# Patient Record
Sex: Male | Born: 1955 | Race: Black or African American | Hispanic: No | Smoking: Current every day smoker
Health system: Southern US, Community
[De-identification: ages and names within clinical notes are randomized; demographics above are authoritative.]

## PROBLEM LIST (undated history)

## (undated) DIAGNOSIS — D329 Benign neoplasm of meninges, unspecified: Secondary | ICD-10-CM

## (undated) DIAGNOSIS — G459 Transient cerebral ischemic attack, unspecified: Secondary | ICD-10-CM

## (undated) DIAGNOSIS — I10 Essential (primary) hypertension: Secondary | ICD-10-CM

## (undated) DIAGNOSIS — G40909 Epilepsy, unspecified, not intractable, without status epilepticus: Secondary | ICD-10-CM

## (undated) DIAGNOSIS — B1920 Unspecified viral hepatitis C without hepatic coma: Secondary | ICD-10-CM

## (undated) DIAGNOSIS — I5031 Acute diastolic (congestive) heart failure: Secondary | ICD-10-CM

## (undated) DIAGNOSIS — N183 Chronic kidney disease, stage 3 unspecified: Secondary | ICD-10-CM

## (undated) DIAGNOSIS — F1010 Alcohol abuse, uncomplicated: Secondary | ICD-10-CM

## (undated) DIAGNOSIS — I701 Atherosclerosis of renal artery: Secondary | ICD-10-CM

## (undated) HISTORY — DX: Acute diastolic (congestive) heart failure: I50.31

## (undated) HISTORY — DX: Transient cerebral ischemic attack, unspecified: G45.9

## (undated) HISTORY — DX: Essential (primary) hypertension: I10

## (undated) HISTORY — DX: Benign neoplasm of meninges, unspecified: D32.9

## (undated) HISTORY — DX: Alcohol abuse, uncomplicated: F10.10

## (undated) HISTORY — DX: Epilepsy, unspecified, not intractable, without status epilepticus: G40.909

## (undated) HISTORY — DX: Chronic kidney disease, stage 3 unspecified: N18.30

## (undated) HISTORY — DX: Unspecified viral hepatitis C without hepatic coma: B19.20

## (undated) HISTORY — DX: Chronic kidney disease, stage 3 (moderate): N18.3

## (undated) HISTORY — DX: Atherosclerosis of renal artery: I70.1

## (undated) HISTORY — PX: BRAIN SURGERY: SHX531

---

## 2002-12-08 ENCOUNTER — Other Ambulatory Visit: Payer: Self-pay

## 2002-12-09 ENCOUNTER — Other Ambulatory Visit: Payer: Self-pay

## 2004-12-25 ENCOUNTER — Emergency Department: Payer: Self-pay | Admitting: Emergency Medicine

## 2005-09-06 ENCOUNTER — Emergency Department: Payer: Self-pay | Admitting: Emergency Medicine

## 2006-05-01 ENCOUNTER — Emergency Department: Payer: Self-pay | Admitting: Emergency Medicine

## 2006-08-14 ENCOUNTER — Emergency Department: Payer: Self-pay | Admitting: Emergency Medicine

## 2006-08-14 ENCOUNTER — Ambulatory Visit: Payer: Self-pay | Admitting: Pulmonary Disease

## 2006-08-14 ENCOUNTER — Inpatient Hospital Stay (HOSPITAL_COMMUNITY): Admission: EM | Admit: 2006-08-14 | Discharge: 2006-09-06 | Payer: Self-pay | Admitting: Neurosurgery

## 2006-08-14 ENCOUNTER — Ambulatory Visit: Payer: Self-pay | Admitting: Cardiology

## 2006-08-17 ENCOUNTER — Encounter (INDEPENDENT_AMBULATORY_CARE_PROVIDER_SITE_OTHER): Payer: Self-pay | Admitting: Neurosurgery

## 2006-08-28 ENCOUNTER — Encounter (INDEPENDENT_AMBULATORY_CARE_PROVIDER_SITE_OTHER): Payer: Self-pay | Admitting: Neurosurgery

## 2006-09-06 ENCOUNTER — Encounter (INDEPENDENT_AMBULATORY_CARE_PROVIDER_SITE_OTHER): Payer: Self-pay | Admitting: Internal Medicine

## 2006-09-09 ENCOUNTER — Ambulatory Visit: Payer: Self-pay | Admitting: Cardiovascular Disease

## 2006-09-09 LAB — CONVERTED CEMR LAB
Calcium: 8.7 mg/dL (ref 8.4–10.5)
GFR calc Af Amer: 55 mL/min
Glucose, Bld: 86 mg/dL (ref 70–99)
Potassium: 4.9 meq/L (ref 3.5–5.1)

## 2006-10-03 ENCOUNTER — Ambulatory Visit: Payer: Self-pay | Admitting: Cardiovascular Disease

## 2006-10-09 ENCOUNTER — Ambulatory Visit: Payer: Self-pay | Admitting: Cardiovascular Disease

## 2006-10-09 LAB — CONVERTED CEMR LAB
BUN: 15 mg/dL (ref 6–23)
CO2: 31 meq/L (ref 19–32)
Calcium: 9.1 mg/dL (ref 8.4–10.5)
Chloride: 107 meq/L (ref 96–112)
Creatinine, Ser: 1.7 mg/dL — ABNORMAL HIGH (ref 0.4–1.5)
GFR calc Af Amer: 55 mL/min
Glucose, Bld: 127 mg/dL — ABNORMAL HIGH (ref 70–99)
Potassium: 3.4 meq/L — ABNORMAL LOW (ref 3.5–5.1)

## 2006-10-21 ENCOUNTER — Encounter (INDEPENDENT_AMBULATORY_CARE_PROVIDER_SITE_OTHER): Payer: Self-pay | Admitting: Internal Medicine

## 2006-10-21 ENCOUNTER — Ambulatory Visit: Payer: Self-pay | Admitting: Family Medicine

## 2006-10-21 DIAGNOSIS — R569 Unspecified convulsions: Secondary | ICD-10-CM

## 2006-10-21 DIAGNOSIS — I701 Atherosclerosis of renal artery: Secondary | ICD-10-CM | POA: Insufficient documentation

## 2006-10-21 DIAGNOSIS — F17200 Nicotine dependence, unspecified, uncomplicated: Secondary | ICD-10-CM | POA: Insufficient documentation

## 2006-10-21 DIAGNOSIS — I10 Essential (primary) hypertension: Secondary | ICD-10-CM | POA: Insufficient documentation

## 2006-10-21 DIAGNOSIS — I6350 Cerebral infarction due to unspecified occlusion or stenosis of unspecified cerebral artery: Secondary | ICD-10-CM | POA: Insufficient documentation

## 2006-10-21 DIAGNOSIS — D320 Benign neoplasm of cerebral meninges: Secondary | ICD-10-CM

## 2006-11-08 ENCOUNTER — Encounter: Admission: RE | Admit: 2006-11-08 | Discharge: 2006-11-08 | Payer: Self-pay | Admitting: Neurosurgery

## 2006-11-19 ENCOUNTER — Ambulatory Visit: Payer: Self-pay | Admitting: Cardiovascular Disease

## 2006-12-25 ENCOUNTER — Ambulatory Visit: Payer: Self-pay | Admitting: Cardiovascular Disease

## 2006-12-25 ENCOUNTER — Ambulatory Visit: Payer: Self-pay

## 2007-01-02 ENCOUNTER — Ambulatory Visit: Payer: Self-pay | Admitting: Cardiovascular Disease

## 2007-01-27 ENCOUNTER — Encounter (INDEPENDENT_AMBULATORY_CARE_PROVIDER_SITE_OTHER): Payer: Self-pay | Admitting: Internal Medicine

## 2007-02-03 ENCOUNTER — Encounter (INDEPENDENT_AMBULATORY_CARE_PROVIDER_SITE_OTHER): Payer: Self-pay | Admitting: Internal Medicine

## 2007-02-10 ENCOUNTER — Ambulatory Visit: Payer: Self-pay | Admitting: Cardiovascular Disease

## 2007-05-27 ENCOUNTER — Ambulatory Visit: Payer: Self-pay | Admitting: Cardiovascular Disease

## 2007-05-27 LAB — CONVERTED CEMR LAB
Chloride: 107 meq/L (ref 96–112)
Creatinine, Ser: 2.8 mg/dL — ABNORMAL HIGH (ref 0.4–1.5)
GFR calc non Af Amer: 26 mL/min
Potassium: 3.6 meq/L (ref 3.5–5.1)
Sodium: 141 meq/L (ref 135–145)

## 2007-12-09 ENCOUNTER — Ambulatory Visit: Payer: Self-pay | Admitting: Cardiovascular Disease

## 2008-01-25 DIAGNOSIS — I129 Hypertensive chronic kidney disease with stage 1 through stage 4 chronic kidney disease, or unspecified chronic kidney disease: Secondary | ICD-10-CM

## 2008-02-10 ENCOUNTER — Encounter: Admission: RE | Admit: 2008-02-10 | Discharge: 2008-02-10 | Payer: Self-pay | Admitting: Neurosurgery

## 2008-03-30 ENCOUNTER — Ambulatory Visit: Payer: Self-pay | Admitting: Cardiovascular Disease

## 2008-04-28 IMAGING — CT CT ANGIO ABDOMEN
2 of 6 series · 16 of 46 positions shown, 18 images · IV contrast (100 ML OMNI 350)
Comparison: none

CLINICAL DATA: Hypertension. Mild renal insufficiency.

[Series 3: renal cta · axial · 0.82mm/px · z∈[-261,-61]mm · 13 of 167 slices shown, 15 images]
[im 9/167  soft-tissue]
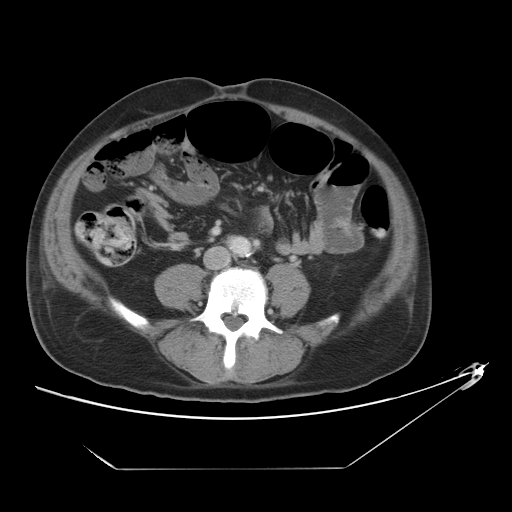
[im 9/167  bone]
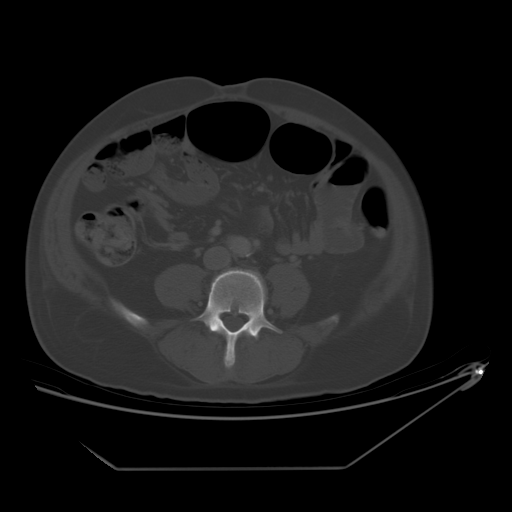
[im 27/167  soft-tissue]
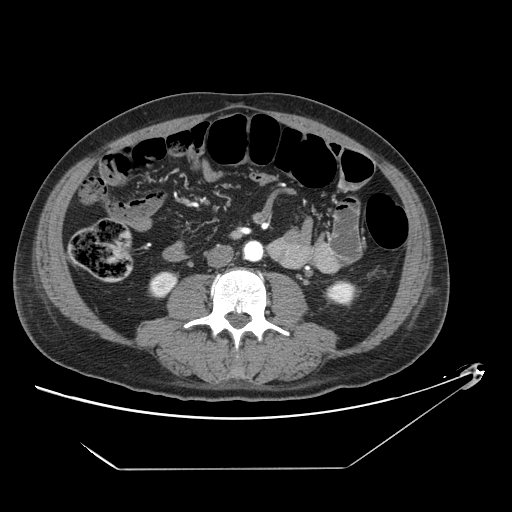
[im 35/167  soft-tissue]
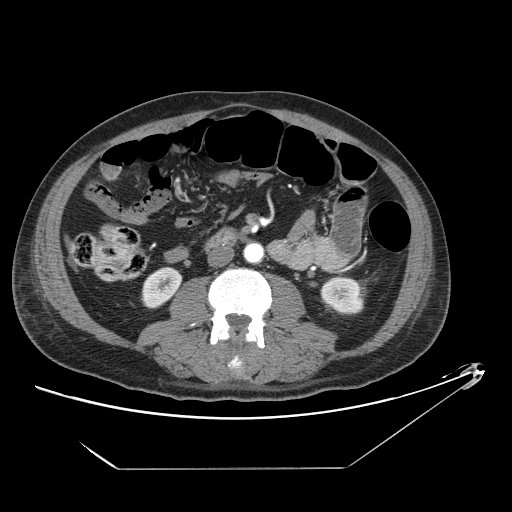
[im 44/167  soft-tissue]
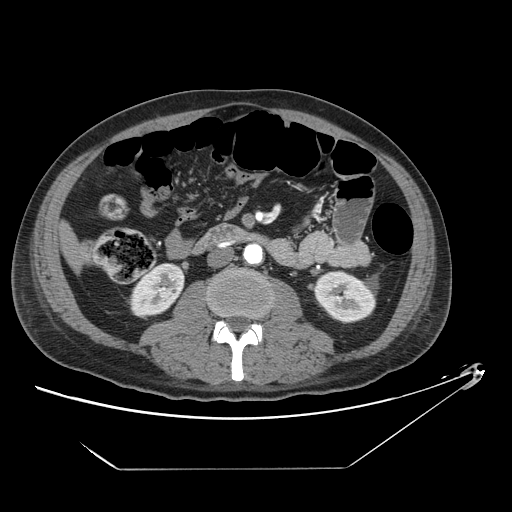
[im 62/167  soft-tissue]
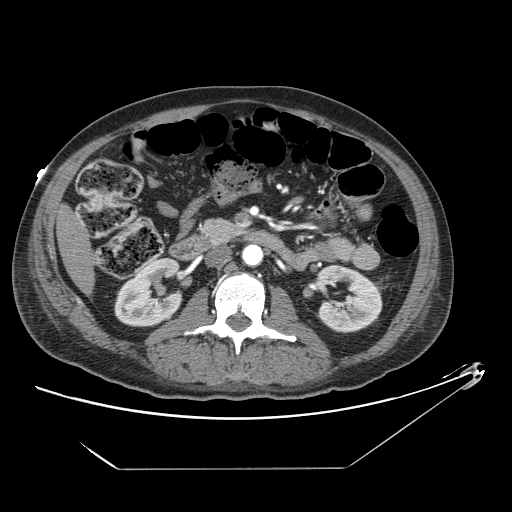
[im 70/167  soft-tissue]
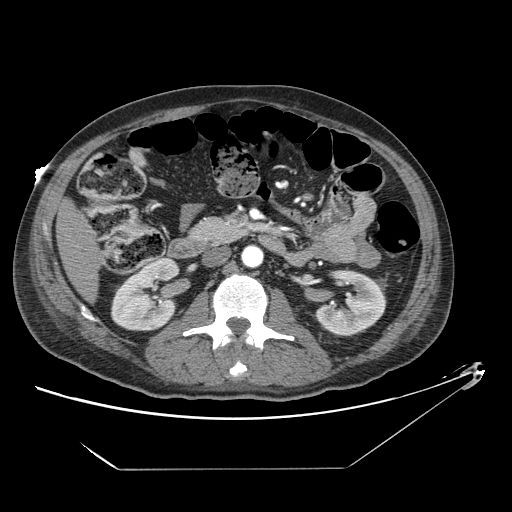
[im 88/167  soft-tissue]
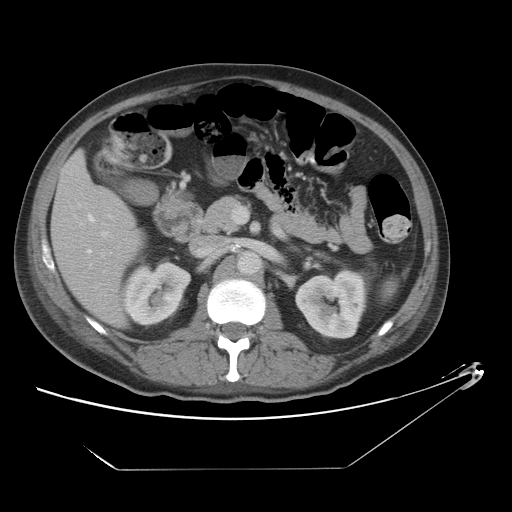
[im 97/167  soft-tissue]
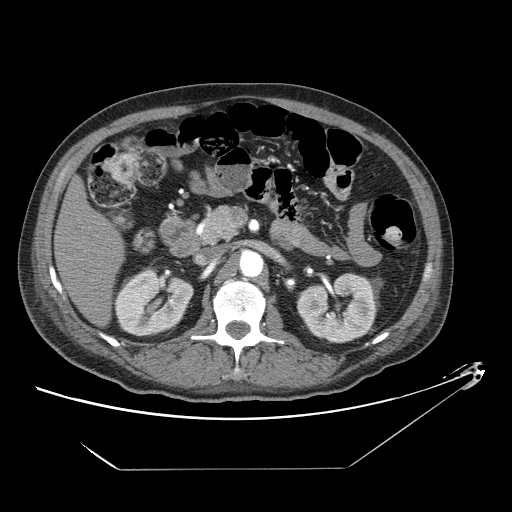
[im 105/167  soft-tissue]
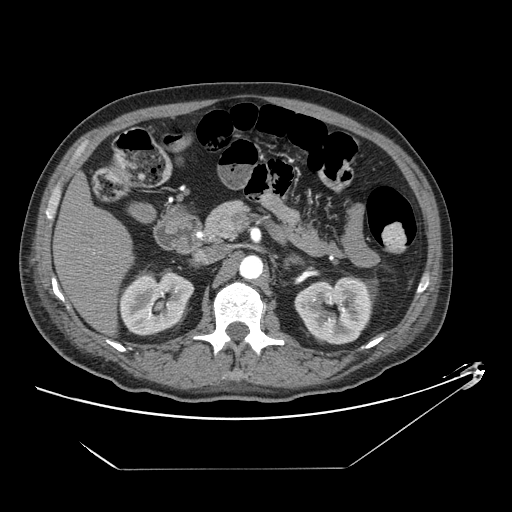
[im 105/167  bone]
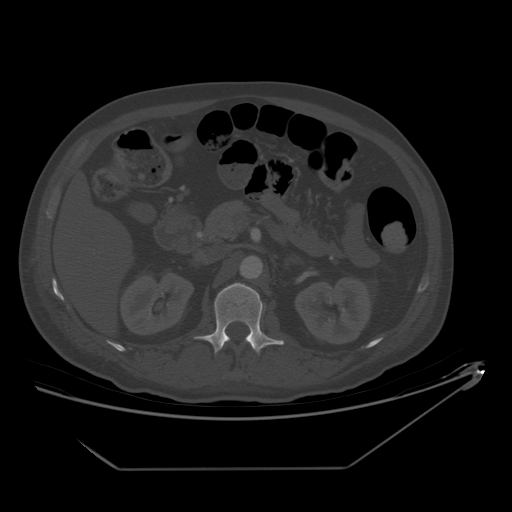
[im 123/167  soft-tissue]
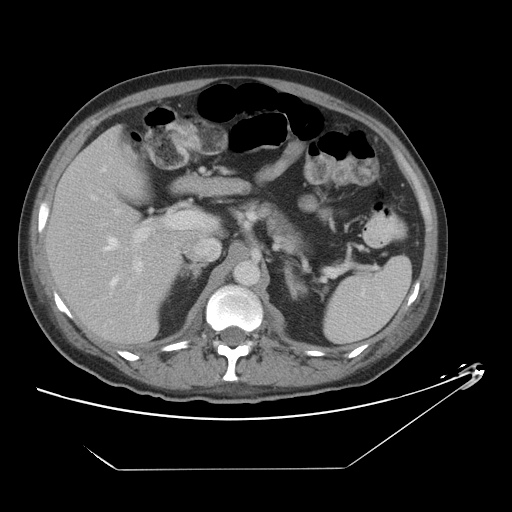
[im 132/167  soft-tissue]
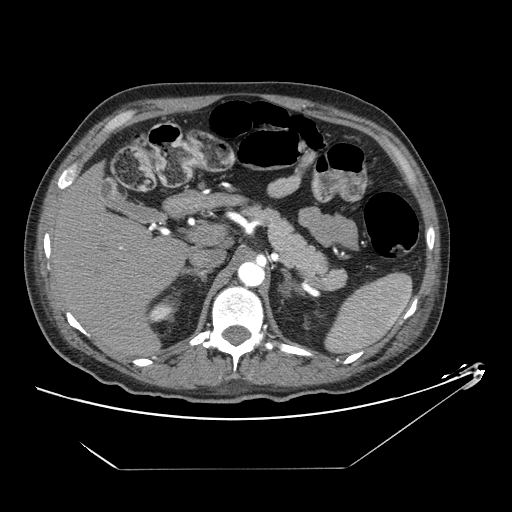
[im 140/167  soft-tissue]
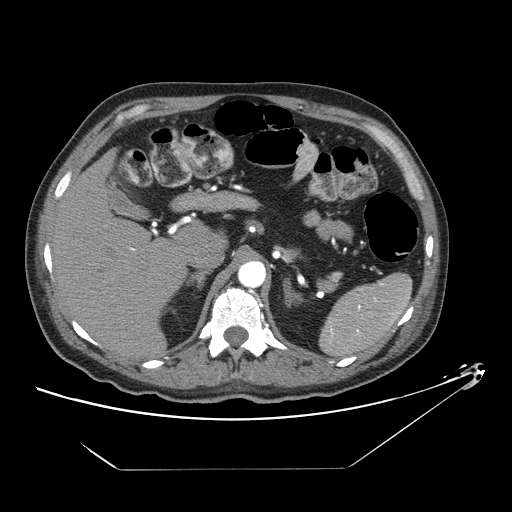
[im 158/167  soft-tissue]
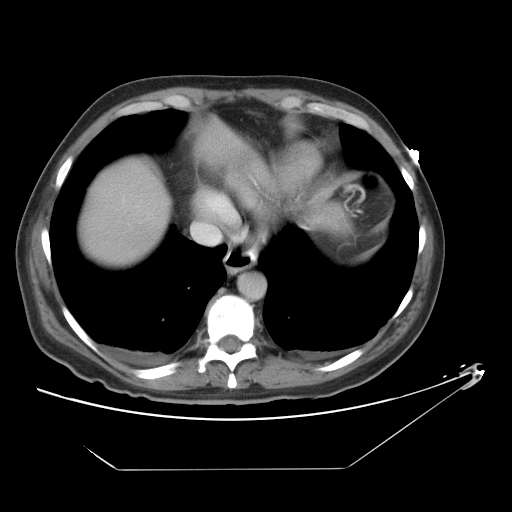

[Series 500: coronal · coronal · 0.82mm/px · 3 of 138 slices shown]
[im 46/138  soft-tissue]
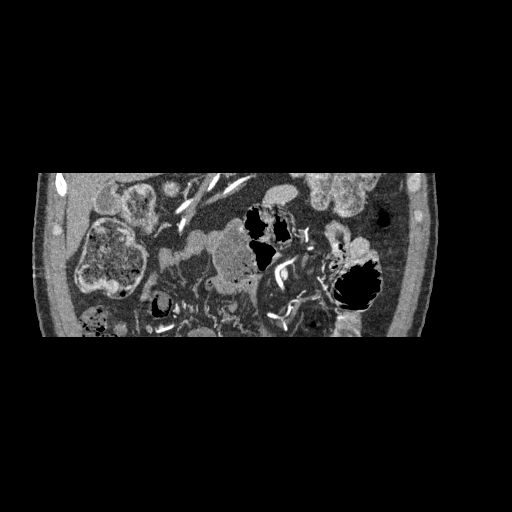
[im 61/138  soft-tissue]
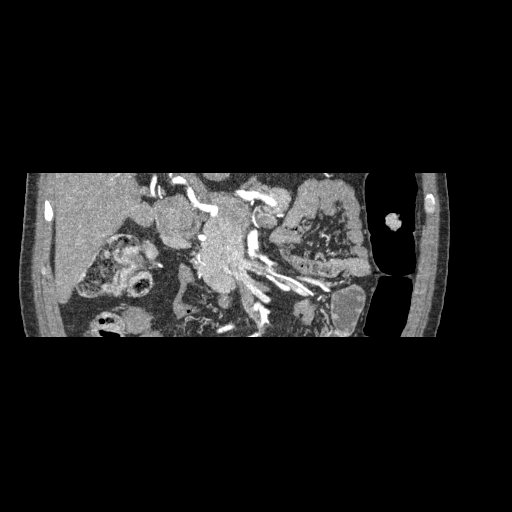
[im 77/138  soft-tissue]
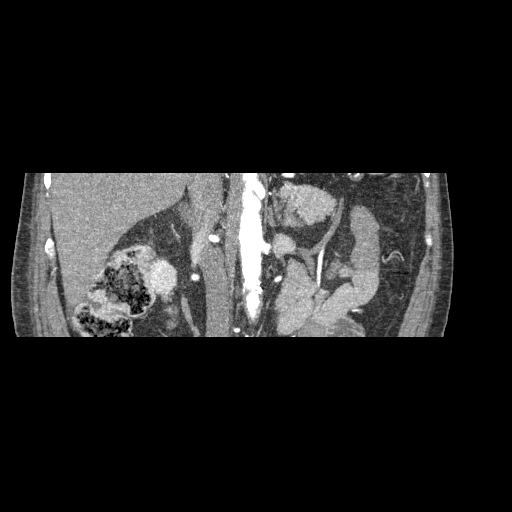

[16 of 46 positions shown; findings below may reference images not displayed]

CT angiogram abdomen with contrast:

Multidetector helical CT of the abdomen  was obtained after 100 ml Omnipaque 350
IV. The initial injection of 50 mL Rmnipaque-Q4T was inadequate, necessitating
repeating the injection the following day. CT multiplanar reconstructions were
rendered to evaluate the vascular anatomy.

Abdominal aorta shows mild atheromatous irregularity without dissection or
stenosis. Celiac axis widely patent. There is noncalcified plaque at the origin
of the SMA resulting in less than 50% diameter stenosis. Two left renal
arteries, superior dominant. There is partially calcified ostial plaque
involving the superior left renal artery with less than 50% diameter stenosis.
Ostial plaque involving the more diminutive inferior left renal artery,
resulting in less than 50% diameter stenosis. Two right renal arteries. The
superior is  dominant, and there is partially calcified plaque just beyond the
ostium, with at least 50% diameter stenosis. Distally this vessel returns to
normal caliber. The more diminutive inferior right renal artery has a short
segment stenosis just beyond the ostium, severity difficult to quantify because
of the relatively diminutive size of this vessel.  Venous phase shows patency of
hepatic veins, portal vein, bilateral renal veins, IVC. 1 cm cyst lower pole
left kidney. No solid renal mass or hydronephrosis.

Unremarkable liver, nondistended gallbladder, spleen, adrenal glands, pancreas.
Visualized portions of small bowel and colon decompressed, unremarkable. No
ascites. No free air. Small bilateral pleural effusions. Dependent atelectasis
posteriorly in both lung bases. Degenerative changes in the lower lumbar spine.
IMPRESSION: 1. Moderate ostial stenoses of the dominant superior right renal artery and more
diminutive bilateral inferior renal arteries. Consider catheter angiography for
further evaluation, as the superior right renal lesion be amenable to
percutaneous treatment if confirmed to be significant.
Findings were telephoned to Dr. Chehade at the time of interpretation.
2. Small bilateral pleural effusions.

## 2008-05-20 ENCOUNTER — Ambulatory Visit: Payer: Self-pay | Admitting: Gastroenterology

## 2008-06-15 ENCOUNTER — Encounter (INDEPENDENT_AMBULATORY_CARE_PROVIDER_SITE_OTHER): Payer: Self-pay | Admitting: Interventional Radiology

## 2008-06-15 ENCOUNTER — Ambulatory Visit (HOSPITAL_COMMUNITY): Admission: RE | Admit: 2008-06-15 | Discharge: 2008-06-15 | Payer: Self-pay | Admitting: Gastroenterology

## 2008-07-12 ENCOUNTER — Encounter: Payer: Self-pay | Admitting: Cardiovascular Disease

## 2008-07-13 ENCOUNTER — Ambulatory Visit: Payer: Self-pay | Admitting: Gastroenterology

## 2008-07-28 ENCOUNTER — Encounter: Payer: Self-pay | Admitting: Cardiovascular Disease

## 2008-08-18 ENCOUNTER — Ambulatory Visit: Payer: Self-pay | Admitting: Cardiology

## 2008-08-18 ENCOUNTER — Ambulatory Visit: Payer: Self-pay

## 2008-08-18 ENCOUNTER — Encounter: Payer: Self-pay | Admitting: Cardiovascular Disease

## 2008-08-19 ENCOUNTER — Ambulatory Visit: Payer: Self-pay | Admitting: Gastroenterology

## 2008-09-07 ENCOUNTER — Encounter: Payer: Self-pay | Admitting: Cardiovascular Disease

## 2008-10-14 ENCOUNTER — Telehealth (INDEPENDENT_AMBULATORY_CARE_PROVIDER_SITE_OTHER): Payer: Self-pay | Admitting: *Deleted

## 2008-10-15 ENCOUNTER — Ambulatory Visit: Payer: Self-pay | Admitting: Cardiovascular Disease

## 2009-02-17 ENCOUNTER — Encounter: Payer: Self-pay | Admitting: Cardiovascular Disease

## 2009-03-14 ENCOUNTER — Telehealth (INDEPENDENT_AMBULATORY_CARE_PROVIDER_SITE_OTHER): Payer: Self-pay | Admitting: *Deleted

## 2009-03-24 ENCOUNTER — Ambulatory Visit: Payer: Self-pay | Admitting: Gastroenterology

## 2009-03-29 ENCOUNTER — Encounter: Payer: Self-pay | Admitting: Cardiovascular Disease

## 2009-04-14 ENCOUNTER — Encounter: Admission: RE | Admit: 2009-04-14 | Discharge: 2009-04-14 | Payer: Self-pay | Admitting: Neurosurgery

## 2009-05-04 ENCOUNTER — Encounter: Payer: Self-pay | Admitting: Cardiovascular Disease

## 2009-05-18 ENCOUNTER — Ambulatory Visit: Payer: Self-pay | Admitting: Cardiovascular Disease

## 2009-05-18 DIAGNOSIS — M109 Gout, unspecified: Secondary | ICD-10-CM

## 2010-02-20 ENCOUNTER — Encounter: Payer: Self-pay | Admitting: Cardiology

## 2010-03-01 NOTE — Progress Notes (Signed)
Summary: [facility]  [facility]   Imported By: Harlon Flor 05/24/2009 15:08:48  _____________________________________________________________________  External Attachment:    Type:   Image     Comment:   External Document

## 2010-03-01 NOTE — Progress Notes (Signed)
Summary: Records request from [employer], [facility]  Request for records received from [employer]. Request forwarded to [employer]. Wilder Glade  March 14, 2009 4:06 PM

## 2010-03-01 NOTE — Letter (Signed)
Summary: [facility] Office Note  [facility] Office Note   Imported By: Roderic Ovens 04/06/2009 15:29:15  _____________________________________________________________________  External Attachment:    Type:   Image     Comment:   External Document

## 2010-03-01 NOTE — Progress Notes (Signed)
Summary: [facility]  [facility]   Imported By: Harlon Flor 04/07/2009 11:18:38  _____________________________________________________________________  External Attachment:    Type:   Image     Comment:   External Document

## 2010-03-01 NOTE — Assessment & Plan Note (Signed)
Summary: ROV/AMD   Visit Type:  Follow-up Referring Provider:  Tonny Bollman Primary Provider:  DR Lacie Scotts  CC:  dizziness.  History of Present Illness: 55 yo with history of resistant HTN, CKD, prior TIA, diastolic CHF presents for follow-up today.  Reports home BP's in the 160/80 range. His BP is up this am but he tells me he's been up all night in pain from acute gout in his elbow. He is still hurting and has taken aleve this morning.  Complains of dizziness of late. His symptoms are predominately postural...they occur with standing. He has seen Dr Newell Coral and follow-up MRI was stable without tumor recurrence.  He denies chest pain. Complains of chronic dyspnea with exertion. No edema or other complaints today.  Current Medications (verified): 1)  Dilantin 100 Mg  Caps (Phenytoin Sodium Extended) .... 2 Tabs Two Times A Day 2)  Lisinopril 20 Mg  Tabs (Lisinopril) .... Take 1 Tablet By Mouth Once A Day 3)  Colchicine 0.6 Mg Tabs (Colchicine) .Marland Kitchen.. 1 To 2 Tablets As Needed 4)  Carvedilol 25 Mg Tabs (Carvedilol) .... Take One Tablet By Mouth Twice A Day 5)  Bidil 20-37.5 Mg Tabs (Isosorb Dinitrate-Hydralazine) .... 2 By Mouth Three Times A Day 6)  Eplerenone 50 Mg Tabs (Eplerenone) .Marland Kitchen.. 1 By Mouth Once Daily 7)  Minoxidil 2.5 Mg Tabs (Minoxidil) .Marland Kitchen.. 1 By Mouth Two Times A Day 8)  Lexapro 20 Mg Tabs (Escitalopram Oxalate) .Marland Kitchen.. 1 By Mouth Once Daily 9)  Aspirin 81 Mg Tbec (Aspirin) .... Take One Tablet By Mouth Daily 10)  Amlodipine Besylate 5 Mg Tabs (Amlodipine Besylate) .... Take One Tablet By Mouth Daily  Allergies (verified): No Known Drug Allergies  Past History:  Past medical history reviewed for relevance to current acute and chronic problems.  Past Medical History: Reviewed history from 08/18/2008 and no changes required. 1. Malignant HTN 2. CKD, Stage III 3. Renal artery stenosis, moderate 4. Meningioma, s/p resection 5. Seizure disorder 6. Hx heavy ETOH 7.  TIA 8. Diastolic CHF: echo (7/10) with EF 65%, moderate LVH 9. HCV   Review of Systems       Positive for occasional nausea, otherwise negative except as per HPI.  Vital Signs:  Patient profile:   55 year old male Height:      71 inches Weight:      171.75 pounds Pulse rate:   83 / minute Resp:     16 per minute BP sitting:   176 / 116  (left arm) Cuff size:   large  Vitals Entered By: Mercer Pod (May 18, 2009 10:19 AM)  Physical Exam  General:  Pt is alert and oriented, African-American male, in no acute distress. HEENT: normal Neck: normal carotid upstrokes without bruits, JVP normal Lungs: CTA CV: RRR without murmur. Positive S4 gallop Abd: soft, NT, positive BS, no bruit, no organomegaly Ext: no clubbing, cyanosis, or edema. peripheral pulses 2+ and equal Skin: warm and dry without rash    EKG  Procedure date:  05/18/2009  Findings:      NSR, HR 83 bpm, LAD, otherwise within normal limits.  Impression & Recommendations:  Problem # 1:  HYPERTENSION, KIDNEY DISEASE, UNCONTROLLED (ICD-403.00) Pt is very difficult to manage. His symptoms sound BP/orthostatic related. However, BP elevated this am - today's BP may be related to circumstances as mentioned in HPI. Orthostatic vital signs measured today and there is no significant drop in blood pressure. Will continue current therapy. Talked about positional changes and how  to wait at least minute with each change in position before getting up and walking. Will check a carotid ultrasound to rule out bilateral carotid disease, but I doubt this.   His updated medication list for this problem includes:    Lisinopril 20 Mg Tabs (Lisinopril) .Marland Kitchen... Take 1 tablet by mouth once a day    Carvedilol 25 Mg Tabs (Carvedilol) .Marland Kitchen... Take one tablet by mouth twice a day    Eplerenone 50 Mg Tabs (Eplerenone) .Marland Kitchen... 1 by mouth once daily    Minoxidil 2.5 Mg Tabs (Minoxidil) .Marland Kitchen... 1 by mouth two times a day    Aspirin 81 Mg Tbec  (Aspirin) .Marland Kitchen... Take one tablet by mouth daily    Amlodipine Besylate 5 Mg Tabs (Amlodipine besylate) .Marland Kitchen... Take one tablet by mouth daily  BP today: 176/116 Prior BP: 172/110 (10/15/2008)  Labs Reviewed: K+: 3.6 (05/27/2007) Creat: : 2.8 (05/27/2007)     Problem # 2:  RENAL ARTERY STENOSIS (ICD-440.1) Mild, involving only a single branch renal artery. No follow-up required at this time. HTN is longstanding and unrelated to RAS.  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 He typically responds well to colchicine. Will write Rx. Asked him to try to minimize NSAID's because of his CKD.  His updated medication list for this problem includes:    Colcrys 0.6 Mg Tabs (Colchicine) .Marland Kitchen... Take 1 tablet by mouth two times a day as needed  Patient Instructions: 1)  Your physician recommends that you schedule a follow-up appointment in: 4 months 2)  Your physician recommends that you continue on your current medications as directed. Please refer to the Current Medication list given to you today. Prescriptions: COLCRYS 0.6 MG TABS (COLCHICINE) Take 1 tablet by mouth two times a day as needed  #60 x 3   Entered by:   Cloyde Reams RN   Authorized by:   Norva Karvonen, MD   Signed by:   Cloyde Reams RN on 05/18/2009   Method used:   Electronically to        [facility]. [address] [phone]* (retail)       [address] [place], [place]  [id number]       Ph: [phone]       Fax: [phone]   RxID:   [id number]

## 2010-05-09 LAB — CBC
Hemoglobin: 12.5 g/dL — ABNORMAL LOW (ref 13.0–17.0)
MCV: 99.5 fL (ref 78.0–100.0)
RBC: 3.61 MIL/uL — ABNORMAL LOW (ref 4.22–5.81)
RDW: 14.7 % (ref 11.5–15.5)
WBC: 6.2 10*3/uL (ref 4.0–10.5)

## 2010-05-09 LAB — PROTIME-INR: Prothrombin Time: 13.2 seconds (ref 11.6–15.2)

## 2010-06-13 NOTE — Discharge Summary (Signed)
[name], [name] NO.:  [id number]   MEDICAL RECORD NO.:  [id number]          PATIENT TYPE:  INP   LOCATION:  3039                         FACILITY:  [facility]   PHYSICIAN:  Hewitt Shorts, M.D.DATE OF BIRTH:  [birth year]   DATE OF ADMISSION:  08/14/2006  DATE OF DISCHARGE:  09/06/2006                               DISCHARGE SUMMARY   ADMISSION HISTORY AND PHYSICAL EXAMINATION:  The patient is a 55-year-  old right-handed black male who was transferred from [facility] Room with diagnosis of left temporal lobe brain  tumor.  He had presented with altered mental status, which was felt  probably due to a generalized seizure.  He was found to have pronounced,  elevated blood pressure.  He was found unresponsive by family members at  home and the emergency room physician had been told that he had some  mental status changes for a few days.  He has a history of hypertension,  but it stopped with antihypertensive medications sometime earlier.  The  patient was loaded with Dilantin at [facility] Emergency Room, given  Decadron.  CT of the brain without contrast was obtained, and showed a  pattern of vasogenic edema within the left temporal lobe and possible  mass within it.  The patient was transferred for neurosurgical care.   PHYSICAL EXAMINATION:  GENERAL:  Blood pressure 166/120, pulse 94.  NEUROLOGIC:  He opened his eyes to voice.  He had no speech.  He was not  following commands.  Pupils were 2 mm, round and reactive to light.  There was a left gaze tendency.  The face appeared symmetrical.  There  was limited movement of the extremities.   HOSPITAL COURSE:  The patient was admitted and initial laboratory  studies revealed mild elevation of creatinine, BUN 25, creatinine 2.06.  The patient was started on Decadron, Pepcid, and continued on Dilantin.  MRI of the brain was obtained, the patient was treated with Ativan for  alcohol  withdrawal prophylaxis, he was restarted on Norvasc for his  pronounced hypertension.  Subsequent chemistries showed improvement of  his renal function, and MRI of the brain was eventually obtained after 2  attempts.  But the study showed that the left temporal lobe vasogenic  edema was due to a moderate-sized left sphenoid wing meningioma.  However, the study also revealed evidence of multiple small right  parietal embolic strokes.  A 2D echocardiogram and carotid Dopplers were  performed without obvious source for his embolic strokes being revealed.  Cerebral arteriography was performed with studies of extra and  intracranial vasculature.  Atherosclerotic changes of the carotid  arteries were noted, but again no obvious source for his embolic  strokes.  But the tumor did have a blush, but did not have significant  vascular supply.  Embolization was not felt to be necessary.  The  patient was continued on Decadron and steadily improved neurologically,  becoming awake and alert, and eventually fully oriented, moving all  extremities well with no drift.  Dilantin levels remained therapeutic,  and no further seizure activity has occurred.  Critical care medicine consultation was obtained because of persistent  hypertension and the possible need for perioperative assistance with  blood pressure management. On August 28, 2006, the patient was taken to  surgery for a left pterional craniotomy with resection of his  meningioma.  At the end of surgery, he was started on a Nipride drip,  which was continued for less than 24 hours postoperatively, but which  controlled his blood pressure well, and we continued him on blood  pressure medications with the assistance of the critical care medicine  service.  Because of a question of cause of his pronounced hypertension  requiring treatment with 5-6 agents, CT angiogram of the renal arteries  was performed, which showed about 50% stenosis of the right  superior  renal artery.  Dr. Danise Mina from the critical care medicine service  consulted [facility], the patient was seen by Dr. Diona Browner, and  subsequently followed up by Dr. Tonny Bollman.  They did not feel that  stenting of the renal artery was needed at this time. Dr. Excell Seltzer has  made a number of adjustments to the patient's medications, and with that  his blood pressure has been better controlled, and at this time is  113/88, and has been less than 150 for several days.   The patient has recovered well from his craniotomy, his wound has healed  well.  We went ahead and removed his staples.  He does have a mild  subgaleal effusion.   Dr. Excell Seltzer has arranged for followup with him.  He is scheduled for  renal ultrasound on October 03, 2006 at 4:30, laboratory work on September 09, 2006 at 3 p.m., and followup with Dr. Excell Seltzer on October 09, 2006  at 11:15.  Dr. Excell Seltzer is giving him prescriptions for lisinopril 20 mg,  Catapres 0.3 mg, Norvasc 10 mg, labetalol 200 mg b.i.d.,  hydrochlorothiazide 25 mg.  Those prescriptions will be given by Dr.  Excell Seltzer, and quantities are uncertain.  From a neurosurgical perspective,  we will give him a prescription for Dilantin 100 mg capsules brand-name  only, 2 capsules b.i.d., #200 capsules prescribed, and 5 refills.  He  needs to follow up with me in 3-4 weeks in the office, and he has been  instructed to use Tylenol or Advil as needed for discomfort.   DISCHARGE DIAGNOSIS:  1. 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  2. Multiple small right parietal embolic strokes at the time of      presentation.  3. Probable generalized seizure, currently treated with therapeutic      Dilantin.  4. Severe systolic and diastolic hypertension, now under much better      control under the regimen described above initiated by Dr. Tonny Bollman, whom the patient will  follow up with as described above.  5. 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  The      patient was treated with some p.r.n. Ativan, and had no significant      withdrawal symptoms, and was instructed not to drink alcohol again.   All of this has been discussed with his aunt, Zettie Pho, who is  going to provide assistance and care following discharge for the  patient.      Hewitt Shorts, M.D.  Electronically Signed     RWN/MEDQ  D:  09/06/2006  T:  09/06/2006  Job:  [id number]   cc:   Veverly Fells. Excell Seltzer, MD

## 2010-06-13 NOTE — Assessment & Plan Note (Signed)
[facility]                            CARDIOLOGY OFFICE NOTE   NAME:Hosman, SALVATOR SEPPALA                     MRN:          [id number]  DATE:05/27/2007                            DOB:          02-18-1955    HISTORY OF PRESENT ILLNESS:  [name] was seen in follow-up at the  [facility] office on May 27, 2007.  Mr. Hassey is a 55-year-  old gentleman who I initially saw back in July 2008 regarding a question  of renal artery stenosis.  He was hospitalized with neurologic symptoms  and was ultimately diagnosed with a left temporal lobe brain tumor.  He  underwent neurosurgical procedure and this was found to be benign.  He  had severe hypertension that was under poor control.  During that  hospitalization with continued elevation of blood pressure, a CT  angiogram was performed that suggested a moderate renal artery stenosis.  This was followed up with a renal duplex performed in our office in  November 2008 which showed normal dominant right renal artery and normal  superior left renal artery and mild stenosis of the inferior left renal  artery.  Mr. Anguiano has been treated with antihypertensive medication  and his blood pressure has been difficult to control.  His medicines  have been changed on several occasions.  When I last saw him in January,  he was on hydralazine, clonidine, Accupril, hydrochlorothiazide,  amlodipine and labetalol.  He has since established with the group at  [facility], and all of his medications have now changed.  He is currently taking Tekturna, HCT and Exforge for his hypertension.  Mr. Opheim has no complaints of dizziness, palpitations and dyspnea.  He  has occasional chest pains that are nonexertional.  His symptoms are  stable over time.  He denies orthopnea, PND or edema.   CURRENT MEDICATIONS:  1. Dilantin 200 mg twice daily.  2. Lexapro 20 mg daily.  3. Tekturna/HCT 300/25 mg daily.  4.  Exforge 10/320 mg daily.  5. Aggrenox twice daily.  6. Meclizine 25 mg t.i.d.  7. Crestor 10 mg daily.   ALLERGIES:  NKDA.   PHYSICAL EXAMINATION:  GENERAL:  He is alert and oriented.  No acute  distress.  VITAL SIGNS:  Weight is 155, blood pressure 134/100, heart rate 77,  respiratory rate 16.  HEENT:  Normal.  NECK:  Normal carotid upstrokes without bruits.  Jugular venous pressure  is normal.  LUNGS:  Clear bilaterally.  HEART:  Regular rate and rhythm with an S4 gallop present.  No murmurs.  ABDOMEN:  Soft, nontender no organomegaly.  EXTREMITIES:  No clubbing, cyanosis or edema.  Peripheral pulses 2+ and  equal throughout.   STUDIES:  EKG shows sinus rhythm with first-degree AV block and a  leftward axis, otherwise within normal limits.   Echocardiogram from July 2008 showed normal left ventricular systolic  function with an LVEF of 60% without regional wall motion abnormalities.  There was a mild LVH.   ASSESSMENT:  Mr. Cavanagh is currently stable from a cardiac standpoint.  His main issue relates to  marked hypertension.  Access to medical care  has been an ongoing struggle.  He lives close to [place], and I think it  makes the most sense for him to have his blood pressure managed by his  primary physician who is now Dr. Janna Arch.  He is currently on a regimen  of Tekturna/hydrochlorothiazide and Exforge as noted above.  I would  recommend further medical titration for a goal blood pressure less than  130/80.  We checked a basic metabolic panel in the office today, and his  creatinine has risen to 2.8 with a BUN of 55.  His baseline creatinine  has been in the range of 1.6-1.8 on past readings.  Although in going  back to his hospitalization, his creatinine was as high as 2.1.  In any  event, I think it would be reasonable to seek a nephrology consultation  so that he has close follow-up for chronic kidney disease.  I will try  to get in touch with Dr.  Janna Arch to see if  there is someone close by  in [place] that he may see.  If not, I will request a consultation  from [place] here in [place].   Finally, in reviewing his medications, I see that he is on Aggrenox.  I  suspect this was started because of concern of a prior stroke.  In  reviewing his neurologic history, it seems that his symptoms were  related to a benign brain tumor rather than a prior stroke, and I  advised him that he could discontinue Aggrenox and change to an 81 mg  aspirin.     Veverly Fells. Excell Seltzer, MD  Electronically Signed    MDC/MedQ  DD: 06/02/2007  DT: 06/02/2007  Job #: [id number]   cc:   Melvyn Novas, MD

## 2010-06-13 NOTE — Op Note (Signed)
[name], OSE NO.:  [id number]   MEDICAL RECORD NO.:  [id number]          PATIENT TYPE:  INP   LOCATION:  3172                         FACILITY:  [facility]   PHYSICIAN:  Hewitt Shorts, M.D.DATE OF BIRTH:  Jan 01, 1956   DATE OF PROCEDURE:  08/28/2006  DATE OF DISCHARGE:                               OPERATIVE REPORT   PREOPERATIVE DIAGNOSIS:  Left sphenoid wing meningioma.   POSTOPERATIVE DIAGNOSIS:  Left sphenoid wing meningioma.   PROCEDURE:  Left pterional craniotomy and gross total resection of  meningioma with microdissection.   SURGEON:  [name].   ASSISTANTWebb Silversmith, NP and Lovell Sheehan.   ANESTHESIA:  General endotracheal.   INDICATIONS:  Patient is a 55 year old man, who presented 2 weeks ago  with altered mental status, seizures and receptive aphasia.  He was  evaluated with CT scan and MRI scan, which revealed a superior right  parietal stroke, as well as vasogenic edema throughout the left temporal  lobe and a left sphenoid wing meningioma extending both into the frontal  and temporal fossas.  Patient was evaluated with arteriography to assess  the relationship of the vessels to the tumor and then patient was  treated with steroids and dilantin and had steadily improved  neurologically.  He is brought to surgery now for resection of his  meningioma.   PROCEDURE:  Patient was brought to the operating room, placed under  general endotracheal anesthesia.  Patient was placed in a 3-pin Mayfield  head holder.  A roll was placed beneath the left shoulder and the head  was gently turned to the right.  The scalp was shaved and then prepped  with Betadine and saline solution and draped in a sterile fashion.  A  curvilinear left pterional incision was made, leaving the underlying  temporalis fascia intact.  Raney clips were applied to the galea and  scalp edges to maintain hemostasis.  Scalp flap was retracted towards  the pterion and then an  interfascial exposure was performed, incising  the temporalis fascia and sweeping the temporalis muscle posteriorly.  Self-retaining retracting hooks were placed and then a craniotomy was  performed, making two bur holes, one in the keyhole and the other in the  lower temporal region.  The dura was dissected from the overlying skull  and then using the craniotome attachment, the bone flap was turned.  Using loop magnification, we removed the exterior portion of the  pterion.  We were able to mobilize the bone flap.  Unfortunately, the  dura was densely adherent to the skull.  The dura was opened as the  craniotomy was turned and elevated.  Bleeding from the middle meningeal  artery was controlled with bipolar cautery.  Then the Budde halo  retractor system was set up and the operating microscope draped and  brought into the field to provide additional magnification,  illumination, and visualization.  The resection was performed using  microdissection and microsurgical technique.  The dura was opened  further to expose the frontal and temporal lobes, as well as the sylvian  fissure.  We immediately identified the edge of  the tumor.  We were able  to gently retract the frontal lobe and begin to dissect open an  arachnoid plane between the tumor and the brain surface.  We immediately  observed that several of the middle cerebral artery branches were  adherent to the tumor surface with numerous small parasitized vessels  supplying the surface of the tumor.  These small vessels were carefully  dissected, coagulated and divided, leaving the parent middle cerebral  artery branches intact.  We then began to mobilize the tumor from the  dural attachment using bipolar cautery.  As the tumor was mobilized, we  then were able to use the ultrasonic aspirator to morselize the central  portion of the tumor, debulk it, to allow for easier dissection of the  tumor from the brain surface and vessels.  The  largest portion of the  tumor was in the temporal fossa with the smaller portion within the  frontal fossa.  We were able to gradually mobilize the tumor and frontal  lobe and were able to open up the cisterns, which allowed for easier  retraction of the frontal lobe.  Then eventually we did not even need to  use the retractor due to the CSF drainage that we were able to perform.  We then marked surgically freed the tumor from the vessels.  Two small  middle cerebral artery branches were embedded within the tumor and  supplied the tumor primarily and were sacrificed, but the main parent  trunks of the middle cerebral artery were left intact.  Throughout the  procedure, the frontal and temporal lobes had good pulsations and we  were able to progressively remove tumor.  Portions of it were saved as  specimens and sent to pathology in formalin for permanent examination.  We were able to progressively mobilize the tumor within the temporal  fossa and eventually removed all the tumor, achieving a gross total  resection.  The last bits of tumor were freed from the middle cerebral  artery and in the end a gross total resection of the entire tumor was  achieved with precentration of the main middle cerebral artery trunks.  The wound was irrigated with saline solution and hemostasis was  confirmed.  Then we proceeded with closure.  We used Dura-Guard as a  dural patch.  This was sutured in place with 4-0 Nurulon sutures.  The  dura that remained in place was also sutured close with 4-0 Nurulon  sutures and a good dural closure was achieved.  We then replaced the  bone flap, securing it in place with Lorenz cranial plates using two  square plates and one small straight A plate with 4 mm screws.  We then  proceeded with closure of the temporalis muscle and fascia using undyed  0 and 2-0 undyed Vicryl sutures.  The galea was then closed with  interrupted inverted 2-0 undyed Vicryl sutures and the skin  was closed  with surgical staples.  The wound was dressed with Adaptic and sterile  gauze and wrapped with two Kerlix and then two Kling.  Procedure was  tolerated well.  The estimated blood loss was 125 ml.  Sponge and needle  count were correct.  Following surgery, the patient was removed from the  3-pin Mayfield head holder to be reversed from anesthetic and extubated.  He was started on a Nipride drip to control his blood pressure during  emergence and is to be transferred to the recovery room for further  care, to  be subsequently transferred to the neurosurgical intensive care  unit for further care.      Hewitt Shorts, M.D.  Electronically Signed     RWN/MEDQ  D:  08/28/2006  T:  08/29/2006  Job:  [id number]   cc:   Hewitt Shorts, M.D.

## 2010-06-13 NOTE — Assessment & Plan Note (Signed)
[facility]                            CARDIOLOGY OFFICE NOTE   NAME:Kary, SLY PARLEE                     MRN:          [id number]  DATE:10/03/2006                            DOB:          Jun 16, 1955    Sheryle Spray was seen in followup at the [facility] Cardiology office on  October 03, 2006.  Mr. Caul is a 55 year old gentleman who initially  presented with altered mental status and seizure like activity.  He was  ultimately diagnosed with a cerebral mass and underwent neurosurgery  with resection of the mass on July 30th.  Mr. Loper has had severe  hypertension for many years and I was consulted in the hospital to  assist with his management and evaluate for renal artery stenosis.  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  I did not  suspect this was a significant finding as his history is more consistent  with severe essential hypertension.  He has had untreated hypertension  for many years and tells me that he commonly has systolic blood  pressures greater than 200 over many years as well.   Since his return home from the hospital, Mr. Ribaudo has multiple  complaints.  He is complaining of headaches and left eye blurry vision  which has been consistent since his discharge home.  He has not had any  chest pain, dyspnea, or edema.  He has had trouble with dizziness as  well.   CURRENT MEDICATIONS:  1. Lisinopril 20 mg daily.  2. Hydrochlorothiazide 25 mg daily.  3. Dilantin 100 mg twice daily.  4. Labetalol 200 mg twice daily.  5. Amlodipine 10 mg daily.  6. Volarium root as needed.  He was unable to fill his clonidine patch due to cost and has run out of  his hydrochlorothiazide.   PHYSICAL EXAMINATION:  VITAL SIGNS:  Weight is 172 pounds, blood  pressure is 194/118,  heart rate is 90.  GENERAL:  He is alert and oriented in no acute distress.  HEENT:  Normal.  NECK:  Normal carotid upstrokes without bruits.  Jugular venous pressure  is normal.  LUNGS:  Clear to auscultation bilaterally.  HEART:  Regular rate and rhythm with an S4 gallop.  ABDOMEN:  Soft nontender.  No abdominal bruits.  EXTREMITIES:  No clubbing, cyanosis, or edema.  Peripheral pulses are 2+  and equal throughout.   EKG shows a sinus rhythm with a 1st degree AV block and left anterior  fascicular block.   ASSESSMENT:  1. Mr. Doolittle continues to have severe hypertension.  I am his first      point of followup since he was discharged from the hospital on      August 8th.  He is going to follow up with Dr. Newell Coral tomorrow.      I had a long discussion with Mr. Owensby today about his need for      ongoing medical care.  He desperately needs  a primary care      physician.  I am going to give him a referral to [place] at [facility].  For his hypertension, I have restarted him on clonidine 0.2      mg twice daily and refilled his prescriptions.  We spent a great      deal of time going over how to take his medications today.  We are      going to bring him in early next week for a blood pressure check      and basic metabolic panel.  We will consider a renal ultrasound at      some point, but I have a fairly low suspicion that renal artery      stenosis is playing a significant role in his hypertension.  2. Regarding his headaches, I do not think that his headaches are a      result of hypertensive urgency.  He has had systolic blood      pressures commonly over 200 in the past and has never had headaches      before.  I think it is more likely related to postoperative      surgical changes.   FOLLOWUP:  As described, he will have a blood pressure check and basic  metabolic panel in one week and I will plan on seeing him back in the  office at some point after that depending  on how well his blood pressure  is controlled next week.     Veverly Fells. Excell Seltzer, MD  Electronically Signed    MDC/MedQ  DD: 10/03/2006  DT: 10/03/2006  Job #: [id number]   cc:   Hewitt Shorts, M.D.

## 2010-06-13 NOTE — Assessment & Plan Note (Signed)
[facility]                            CARDIOLOGY OFFICE NOTE   NAME:[name] [name] DUDDY                     MRN:          [id number]  DATE:12/09/2007                            DOB:          1955/02/01    [name] [name] was seen in followup at [facility] Cardiology office on  December 09, 2007.  He is a 55 year old gentleman with malignant  hypertension.  He has had difficulty in the past with medical compliance  due to cost issues.  He makes his best effort at taking his medications.  He requires multiple antihypertensive medications for even marginal  control of his blood pressure.  [name] [name] complains of dizziness when  bending over or standing up.  He also has some exertional dyspnea.  He  denies chest pain, edema, orthopnea, or PND.  He has been evaluated by  Dr. Hyman Hopes with [facility] for his marked hypertension and renal  dysfunction.   CURRENT MEDICATIONS:  1. Lexapro 15 mg daily.  2. Eplerenone 50 mg daily.  3. Carvedilol 25 mg twice daily.  4. Dilantin 200 mg twice daily.  5. Exforge 10/320 mg daily.  6. Meclizine 25 mg t.i.d.  7. Crestor 10 mg daily.  8. Aspirin 81 mg daily.   ALLERGIES:  NKDA.   PHYSICAL EXAMINATION:  GENERAL:  The patient is alert and oriented.  He  is in no acute distress.  VITAL SIGNS:  Weight is 165 pounds, blood pressure 178/120, heart rate  76, and respiratory rate 16.  HEENT:  Normal.  NECK:  Normal carotid upstrokes without bruits.  JVP normal.  LUNGS:  Clear bilaterally.  HEART:  Regular rate and rhythm with an S4 gallop.  ABDOMEN:  Soft, nontender, and no organomegaly.  EXTREMITIES:  No clubbing, cyanosis, or edema.  Peripheral pulses are  intact and equal.   ASSESSMENT:  [name] [name] continues to have a difficult time with  malignant hypertension.  His blood pressure is markedly elevated today.  He has tolerated clonidine in the past.  I elected to restart him on 0.2  mg twice daily in addition to his  current medicines.  He has a followup  appointment coming up with Dr. Hyman Hopes and we are going to schedule him for  close followup in our office as well.     [name] Fells. Excell Seltzer, MD  Electronically Signed    MDC/MedQ  DD: 12/15/2007  DT: 12/16/2007  Job #: [id number]   cc:   [name] [name], M.D.

## 2010-06-13 NOTE — Consult Note (Signed)
[name], [name] NO.:  [id number]   MEDICAL RECORD NO.:  [id number]          PATIENT TYPE:  INP   LOCATION:  3039                         FACILITY:  [facility]   PHYSICIAN:  Jonelle Sidle, MD DATE OF BIRTH:  [birth year]   DATE OF CONSULTATION:  DATE OF DISCHARGE:                                 CONSULTATION   REQUESTING PHYSICIAN:  Charlcie Cradle. Delford Field, MD, FCCP.   REASON FOR CONSULTATION:  Recently documented renal artery stenosis and  hypertension.   HISTORY OF PRESENT ILLNESS:  [name] is a 55 year old male admitted  to the hospital in transfer from [facility] back  on July 16th, with a change in mental status, seizures, and evidence of  a mass in the left temporal region with edema by CT scan.  He  underwent a craniotomy with gross total resection of a meningioma with  microdissection by Dr. Jule Ser on July 30th, and is recuperating at  this time.  He had an MRI scan done as well demonstrating a cluster of  embolic strokes in the right parietal region felt to be potentially  hemorrhagic.  In addition, he has been noted to be significantly  hypertensive.  [name] states that he has been on atenolol and  Norvasc as an outpatient for quite some time and has carried a diagnosis  of hypertension for many years, although admits that he has not had  regular followup for this.  While he has been observed here in the  hospital, he has required the addition of more antihypertensive  medications for blood pressure control.  His blood pressure recently  seems to be quite well controlled but was as high as the 180 over the  115 range in late July.  It was recommended that he undergo a CT scan of  the abdomen with contrast to assess for renal artery stenosis.  It was  noted that the patient has 2 left renal arteries, the superior one being  dominant.  There is some calcification of the ostium with plaque in the  superior left renal artery  with less than 50% diameter stenosis.  There  is also some ostial plaque involving the more inferior left renal  artery, although again being described as less than 50% stenosis.  Two  right renal arteries are also noted with at least 50% diameter stenosis  involving the more dominant superior segment and difficulty in grading  the inferior more diminutive segment.  We have been asked to evaluate  the patient for the possibility of further assessment angiographically  and the potential for benefit from percutaneous revascularization.   In reviewing the patient's laboratory data, his creatinine recently is  1.5.  He is on standing steroid therapy following his craniotomy.   ALLERGIES:  No known drug allergies.   PRESENT MEDICATIONS:  1. Decadron 6 mg IV q.6 h.  2. Norvasc 10 mg p.o. daily.  3. Atenolol 100 mg p.o. daily.  4. Allopurinol 300 mg p.o. daily.  5. Lotensin 40 mg p.o. daily.  6. Dilantin 200 mg p.o. b.i.d.  7. Triamterene.  8.  Sulfamethoxazole one tablet p.o. q.12 h.  9. Insulin sliding scale.  10.Protonix 40 mg p.o. daily.   PAST MEDICAL HISTORY:  As outlined above.  Additional problems include  gouty arthritis.  He has no described history of cardiovascular disease  or myocardial infarction.   SOCIAL HISTORY:  The patient works at [employer].  He has a tobacco  use of 1-2 packs per day since age 65 and drinks several beers on a  regular basis.   FAMILY HISTORY:  Reviewed and is largely noncontributory, although  limited.  The patient's mother died at age 42 giving birth to a child.  His father is living but the patient has little contact with him.   REVIEW OF SYSTEMS:  As described in the history of present illness.  Denies any typical exertional chest pain or dyspnea.  I note that the  patient has had some mental status changes around the time of his  original presentation and also possible seizure activity at that time.  He was noted to be significantly  hypertensive with systolics greater  than 200 at that point.  He tells me that typically his blood pressure  was not markedly elevated, although again I am not certain that he had  regular followup for this as an outpatient.   PHYSICAL EXAMINATION:  VITAL SIGNS:  Temperature is 98.5 degrees, heart  rate 72, respirations 20, blood pressure 127/84, oxygen saturation is  98% on room air.  GENERAL:  The patient is comfortable in no acute distress, appearing  normally nourished.  HEENT:  Conjunctivae and lids are normal.  Oropharynx is clear.  NECK:  Supple.  No loud carotid bruits.  No thyromegaly is noted.  LUNGS:  Generally clear, non-labored breathing at rest.  CARDIAC:  Reveals a regular rate and rhythm.  There is a 2/6 systolic  murmur heard best at the base.  The second heart sound is preserved.  No  S3 gallop or pericardial rub is evident.  ABDOMEN:  Soft, nontender.  No abdominal bruit is appreciated.  Bowel  sounds are present.  EXTREMITIES:  Show no pitting edema.  Distal pulses are 2+.  SKIN:  Warm and dry.  MUSCULOSKELETAL:  No kyphosis is noted.  NEUROPSYCHIATRIC:  The patient is alert and oriented x3.   Echocardiography on July 22nd, demonstrated an ejection fraction of 60%  without regional wall motion abnormalities.  There was left ventricular  hypertrophy and evidence of diastolic dysfunction.   LABORATORY DATA:  Sodium 138, potassium 4.6, chloride 110, bicarb 23,  glucose 113, BUN 27, creatinine 1.58.  WBC 17.1, hemoglobin 11,  hematocrit 33.2, platelets 188.   IMPRESSION:  1. Hypertension, difficult to control recently, although in the      setting of recent craniotomy status post resection of a meningioma      and on standing Decadron.  The patient has been on 2 medications as      an outpatient for control of his high blood pressure, although has      not had regular followup and was significantly hypertensive on      presentation originally in July at [facility].  He      is now on the regimen outlined above and has good blood pressure      control over the last few days.  He has evidence of some degree of      renal artery stenosis as discussed above, although it does not      sound  critical.  And it is not entirely clear to me that this is      directly related to his blood pressure control.  He does have some      element of renal insufficiency to consider, however.  2. Status post craniotomy with meningioma resection on July 30th, by      Dr. Jule Ser.  3. History of regular tobacco and alcohol use.   RECOMMENDATIONS:  I will discuss the situation with Dr. Tonny Bollman  and get his formal opinion as an interventional peripheral vascular  specialist.  It is not entirely clear to me that percutaneous  intervention would necessarily lead to a significant improvement,  although I will ask for him to review the CT angiogram and consider the  matter further.  At the present time, the patient's blood pressure is  well controlled and no specific medication adjustments will be made.  The standing steroids that the patient is on at this point may be  contributing to some  difficulty in controlling blood pressure.  Clearly, the patient's  history of regular alcohol use and tobacco use are also contributors.  Given his recent craniotomy and possible hemorrhaguc strokes, it is not  clear that peripheral stent placement would be a possibility given need  for Plavix  We will follow with you.      Jonelle Sidle, MD  Electronically Signed     SGM/MEDQ  D:  09/01/2006  T:  09/01/2006  Job:  [id number]   cc:   Hewitt Shorts, M.D.  Charlcie Cradle Delford Field, MD, FCCP

## 2010-06-13 NOTE — Assessment & Plan Note (Signed)
[facility]                            CARDIOLOGY OFFICE NOTE   NAME:[name], [name]                     MRN:          [id number]  DATE:01/02/2007                            DOB:          January 13, 1956    [name] was seen in followup at the [facility] Cardiology office on  January 02, 2007.   [name] is a 55 year old gentleman who has malignant hypertension.  It has been very difficult to control his blood pressure, despite  multiple antihypertensive medications.  He has been evaluated for renal  artery stenosis.  When he was hospitalized in October with a brain mass,  he exhibited marked hypertension.  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  Since discharge from the hospital, his blood pressures  have continued to be labile.  I have added in multiple medications to  attempt better control. A renal ultrasound was performed on November 26;  this did not show any evidence of physiologically significant renal  artery stenosis.  The kidney size was stable bilaterally at 11 cm in  length.   CURRENT MEDICATIONS:  1. Dilantin 100 mg two twice daily.  2. Labetalol 200 mg twice daily.  3. Amlodipine 10 mg daily.  4. Accupril/HCT 20/25 mg daily.  5. Clonidine 0.3 mg twice daily.   ALLERGIES:  No known drug allergies.   PHYSICAL EXAMINATION:  On exam, the patient is alert and oriented, in no  acute distress.  Weight is 165, blood pressure 146/98, heart rate is 88, respiratory rate  16.  HEENT:  Normal.  NECK:  Normal carotid upstrokes without bruits.  Jugular venous pressure  is normal.  LUNGS:  Clear to auscultation bilaterally.  HEART:  Regular rate and rhythm without murmurs or gallops.  ABDOMEN:  Soft and nontender.  No organomegaly.  EXTREMITIES:  No clubbing, cyanosis, or edema.   ASSESSMENT:  [name] has malignant hypertension refractory to  multiple  medications; this is in the setting of chronic kidney disease  with a baseline creatinine in the range of 1.5 to 2.  His renal function  has been stable.  His blood pressure, while not at goal, is greatly  improved from his last visit, where he had a diastolic blood pressure of  120.  I would like to be aggressive with his blood pressure control in  the setting of his chronic kidney disease.  I have added hydralazine to  be taken 25 mg t.i.d. for 1 week and then up to 50 mg t.i.d. thereafter.  I would like to see [name] back in 3 months for followup.  At that time, I will repeat a  basic metabolic panel to make sure that his renal function is stable.     Veverly Fells. Excell Seltzer, MD  Electronically Signed    MDC/MedQ  DD: 01/02/2007  DT: 01/02/2007  Job #: [id number]   cc:   Hewitt Shorts, M.D.

## 2010-06-13 NOTE — H&P (Signed)
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  The patient had been brought earlier by EMS to the [facility] emergency room because of altered mental status, probable  generalized seizure and elevated blood pressure.  The patient had been  found unresponsive at his home by family members.  The emergency room  physician, Dr. Leilani Merl, reported that she had been told that he had been  having some mental status changes for a few days.  He has a history of  hypertension and apparently had stopped taking his antihypertensive some  time over the past few weeks or months.  His blood pressure is elevated  greater than 200 systolic in the emergency room and Dr. Leilani Merl treated  it with a variety of medications.  She also loaded the patient with 1000  phenytoin equivalents of Cerebyx and gave the patient a dose of 10 mg of  Decadron IV after having obtained a CT scan of the brain without  contrast, that revealed  a mass lesion within the left temporal lobe  with significant vasogenic edema suggestive of probable left temporal  lobe brain tumor.   Dr. Leilani Merl requested neurosurgical transfer and the patient was  transferred to the medical intensive care unit here at [facility].   The patient himself is unresponsive and is unable to provide any further  history.  I subsequently spoke with the patient's aunt and cousin.  They  explained that in addition to the  hypertension he does have a history of  gout.  No previous surgeries, NO KNOWN DRUG ALLERGIES and his  medications included antihypertensives and Allopurinol.   FAMILY HISTORY:  Notable for his mother having died at age 62 in  childbirth.  His father is living but has had no contact with the  patient.   SOCIAL HISTORY:  The patient works at [employer].  He has been smoking  since age 74.  He drinks beer heavily on a daily basis but apparently  does not use any street drugs.   REVIEW OF SYSTEMS:  Unobtainable from the patient.   PHYSICAL EXAMINATION:  GENERAL:  The patient is a well-developed, well-  nourished black male who opens his eyes to voice with a left gaze  tendency.  No speech and not following commands.  VITAL SIGNS:  Temperature is 97.3, pulse 94, blood pressure 166/120.  LUNGS:  Clear to auscultation.  He has symmetrical respiration.  HEART:  Regular rate and rhythm with no S1, S2 or murmur.  ABDOMEN:  Soft, nontender and nondistended.  Bowel sounds are  diminished.  EXTREMITIES:  No clubbing, cyanosis or edema.  NEURO:  Mental status:  The patient opens his eyes to voice.  He has no  speech.  He is not following commands.  Cranial nerves show pupils to be  2 mm round and reactive to light.  There is a left gaze tendency.  Facial appearance is symmetrical.  Motor examination shows limited  movement to the extremities.  Further exam is not feasible due to his  altered mental status.   IMPRESSION:  1. 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  I think it is more likely      to be primary than metastatic.  2. Hypertension.  Currently, poorly controlled.  3. Seizures.  4. Altered mental status and a probable aphagia.   PLAN:  The patient is admitted to the intensive care unit for neurologic  checks.  We will continue him on Decadron.  Start him on Pepcid.  Continue him on Cerebyx.  We will place PAS boots.  We will recheck  laboratories in the morning.  Laboratories  at [facility] showed a potassium of 3.1 and a creatinine of 1.9.  We will  eventually proceed with MRI of the brain and possibly CT of the brain  with stealth protocol.   I spoke at length with the patient's aunt, Zettie Pho, home phone  #[phone] and cellular # [phone] as well as with the patient's  cousin, Cain Saupe, cell phone # [phone].  I spoke with them at  length regarding the patient's condition, our plans for treatment and  possible diagnosis as well as the uncertainty of the diagnosis.  The  understand the seriousness of his condition and their questions were  answered.      Hewitt Shorts, M.D.  Electronically Signed     RWN/MEDQ  D:  08/14/2006  T:  08/15/2006  Job:  [id number]

## 2010-06-13 NOTE — Assessment & Plan Note (Signed)
[facility] OFFICE NOTE   NAME:Foutz, DENNIES COATE                     MRN:          [id number]  DATE:03/30/2008                            DOB:          1955-09-05    REASON FOR VISIT:  Malignant hypertension and shortness of breath.   HISTORY OF PRESENT ILLNESS:  Mr. Dahlen is a 55 year old gentleman with  longstanding malignant hypertension.  He is on multiple medications and  continues to have very high blood pressure.  In the past, we have had  difficulty finding a medical regimen that he can tolerate due to both  side effects and cost issues.  He has had improved blood pressure  control recently under the care of Dr. Hyman Hopes.   From a symptomatic standpoint, he has fleeting chest pains in the left  chest that are nonexertional in nature and are self-limited.  He also  complains of exertional dyspnea with moderate level activities such as  sweeping the floor or cleaning his house.  He does not participate in  regular exercise.  He has chronic headaches after a neurosurgical  procedure.  He has no other complaints today.   MEDICATIONS:  1. Catapres patch 0.2 mg applied weekly.  2. Twynsta 80/10 mg daily (combination of telmisartan and amlodipine).  3. Penicillin 100 mg two b.i.d.  4. Eplerenone 50 mg daily.  5. Carvedilol 25 mg b.i.d.  6. Meclizine 25 mg t.i.d.  7. Lexapro 20 mg daily.   ALLERGIES:  NKDA.   PHYSICAL EXAMINATION:  GENERAL:  The patient is alert and oriented.  No  acute distress.  VITAL SIGNS:  Weight is 169, blood pressure 160/112, heart rate 80,  respiratory rate 16.  HEENT:  Normal.  NECK:  Normal carotid upstrokes, no bruits.  JVP normal.  LUNGS:  Clear bilaterally.  HEART:  Regular rate and rhythm with an S4 gallop.  ABDOMEN:  Soft, nontender.  No organomegaly.  No abdominal bruits.  EXTREMITIES:  No clubbing, cyanosis, or edema.  Peripheral pulses are 2+  and equal throughout.   EKG shows normal sinus rhythm with left anterior fascicular block and  first-degree AV block, otherwise within normal limits.   ASSESSMENT:  1. Malignant hypertension.  While his blood pressure is high, it is      improved from his baseline.  He has had some extremely high blood      pressure readings overtime.  He is being managed at present by Dr.      Hyman Hopes and I am going to defer to him for further antihypertensive      treatment.  I think it is better to have one person directing his      care rather than too may cooks in the kitchen.  2. Shortness of breath.  I am concerned that his symptoms are related      to diastolic dysfunction and a stiff left ventricle in the setting      of his severe hypertension.  He had an echo approximately 2 years      ago and I would like to repeat  this.  I may consider adding the      diuretic to his medical program.  3. Chronic kidney disease, followed by Dr. Hyman Hopes.   For followup, I would like to see Mr. Biondolillo in 6 months.  I will  contact him by phone after the results of his echo are completed.  We  will consider the addition of loop diuretic to his current medical  program.     Veverly Fells. Excell Seltzer, MD     MDC/MedQ  DD: 03/30/2008  DT: 03/31/2008  Job #: [id number]   cc:   Garnetta Buddy, M.D.

## 2010-06-13 NOTE — Consult Note (Signed)
[name], GILKISON NO.:  [id number]   MEDICAL RECORD NO.:  [id number]          PATIENT TYPE:  INP   LOCATION:  5148                         FACILITY:  [facility]   PHYSICIAN:  Coralyn Helling, MD        DATE OF BIRTH:  1955-12-22   DATE OF CONSULTATION:  08/27/2006  DATE OF DISCHARGE:                                 CONSULTATION   REFERRING PHYSICIAN:  Hewitt Shorts, M.D.   REASON FOR CONSULTATION:  Hypertension.   Mr. Engel is a 55 year old male who was admitted to [facility]  on July 16. He was received in transfer from [facility].  He had  presented to [facility] on July 16 after having a change in his  mental status.  He apparently had seizure-like activity and was noted to  have an increase in blood pressure. He does have a history of  hypertension and was on multiple medications as an outpatient, although  I am not sure if he was using these on a regular basis. In the emergency  room at [facility], he was given Decadron and Cerebyx and had a  CT scan of his head which showed a mass in the left temporal region with  vasogenic edema. As a result, he was transferred to [facility].  He was initially admitted to the intensive care unit under the  neurosurgery service.  He was continued on his seizure medications well  as Decadron.   He had an MRI done on July 18 which showed a cluster of embolic strokes  in the right parietal region, which appear to be hemorrhagic, as well as  a 2 cm mass in left temporal region.  He also had a cerebral arteriogram  done on July 22.  He currently is clinically stable and is scheduled to  undergo resection of the mass lesion on July 30.  Of note is that he has  been having elevations in his blood pressure, particularly at night.   PAST MEDICAL HISTORY:  Significant for:  1. Hypertension.  2. Gout.  3. Tobacco abuse.  4. Alcohol abuse.   CURRENT MEDICATIONS:  1. Benazepril 40 mg  daily.  2. Isordil 60 mg b.i.d.  3. Allopurinol 20 mg daily.  4. Norvasc 10 mg daily.  5. Tylenol 100 mg daily.   ALLERGIES:  He has no known drug allergies.   SOCIAL HISTORY:  He works at [employer]. He has smoked one to two pack  cigarettes a day since he was 17.  He drinks several beers on a daily  basis.   FAMILY HISTORY:  Significant for his mother who died in childbirth.   REVIEW OF SYSTEMS:  He complains of difficulty with memory as well as  feeling sleepy during the day.   PHYSICAL EXAMINATION:  GENERAL:  He is seen in his hospital room.  He is  awake, alert, oriented, does not appear to be acute distress.  VITAL SIGNS:  Blood pressure is 158/90, heart rate 70, respiratory rate  20, oxygen saturation 99% on room air.  Temperature is 99.  HEENT:  Pupils reactive.  There is no sinus tenderness.  No nasal  discharge.  He has a Mallampati III airway, and he is retrognathic as  well as micrognathic. There is no lymphadenopathy, no thyromegaly.  HEART:  S1-S2, regular rhythm.  CHEST:  Clear to auscultation.  ABDOMEN:  Thin soft, nontender, positive bowel sounds.  EXTREMITIES:  There was no edema, cyanosis, clubbing.  NEUROLOGIC:  He had 5/5 strength. Was able to follow commands  appropriately, and no deficits were appreciated.   EKG from July 17 showed normal sinus rhythm with left axis deviation.   Echocardiogram from July 19 showed an ejection fraction of 60% with mild  LVH and diastolic dysfunction.   Urine culture from July 28 is pending.   Chest x-ray from July 17 was unremarkable.   Recent laboratory tests show WBC 15.8, hemoglobin 13.8, hematocrit 41.3,  platelet count 204, MCV 104.  Sodium is 137, potassium 3.6, chloride  102, CO2 28, BUN 23, creatinine 1.5, glucose 123, AST 39, ALT 85,  alkaline phosphatase 95, bilirubin 0.4, albumin 2.4, calcium 8.8   IMPRESSION:  1. Left temporal region mass.  He is to continue on Decadron per      neurosurgery for this.  He scheduled to undergo resection of this on      July 30.  2. Seizure disorder related likely to the above.  He is to continue on      Dilantin as per neurosurgery.  3. Refractory hypertension.  He is continue on his Catapres patch,      Lotensin, Norvasc and Tenormin for now.  Depending upon his      clinical status postoperatively, appropriate adjustments will be      made in his antihypertensive regimen. He also appears to be having      elevations in his blood pressure, more so at night relative to how      his blood pressure is during the day. He does also have physical      findings suggestive of sleep apnea. If, in fact, he does have sleep      apnea, then empiric CPAP therapy may be beneficial with regards to      improving his blood pressure.  4. Diastolic dysfunction secondary to his poorly controlled      hypertension.  5. Tobacco abuse.  He will need to remain tobacco free once he      recovers from this acute illness.  6. Hemorrhagic cerebrovascular accident.  This is to be further      managed by neurosurgery.  7. Alcohol abuse.  He appears to have a normal mental status but will      need to have further counseling with regards to remaining abstinent      from alcohol once he recovers from his acute illness.  8. Gout.  He is to continue on allopurinol.      Coralyn Helling, MD  Electronically Signed     VS/MEDQ  D:  08/27/2006  T:  08/27/2006  Job:  [id number]   cc:   Hewitt Shorts, M.D.

## 2010-06-13 NOTE — Assessment & Plan Note (Signed)
[facility]                            CARDIOLOGY OFFICE NOTE   NAME:[name] [name] BOCCIO                     MRN:          [id number]  DATE:11/19/2006                            DOB:          [birth year]    [name] [name] returns for follow-up at [name] Cardiology Office on  November 19, 2006.  [name] [name] was diagnosed with a cerebral mass and  underwent neurosurgery in July of this year.  Postoperatively, he was  noted to have severe hypertension and I was consulted during his  hospital stay for evaluation of renal artery stenosis.  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  [name] [name]  has longstanding hypertension and has been untreated for many years.   From a symptomatic standpoint he is doing relatively well.  He has had  difficulty with obtaining medications, but recently has been getting his  medications from an assistance program that is offered in [facility].  His lisinopril and hydrochlorothiazide have been changed out to  Accuretic.  Otherwise he has had no medication changes.  When he  returned in September for a follow-up blood pressure check, his blood  pressure was excellent at 112/62.  He continues to have some memory  problems, but denies any chest pain, dyspnea, or cardiovascular  symptoms.   CURRENT MEDICATIONS:  1. Dilantin 100 two twice daily.  2. Labetalol 200 mg twice daily.  3. Amlodipine 10 mg daily.  4. Clonidine 0.2 mg twice daily.  5. Accupril/HCT 20/25 mg daily.   PHYSICAL EXAMINATION:  GENERAL:  He is alert and oriented and in no  acute distress.  VITAL SIGNS:  Weight 167, blood pressure 164/120, heart rate 84,  respiratory rate 16.  HEENT:  Normal.  NECK:  Normal carotid upstrokes without bruits.  Jugular venous pressure  is normal.  LUNGS:  Clear to auscultation bilaterally.  CARDIOVASCULAR:  Heart is  regular rate and rhythm without murmurs or  gallops.  ABDOMEN:  Soft and nontender with no organomegaly.  EXTREMITIES:  No cyanosis, clubbing, or edema.  Peripheral pulses 2+ and  equal throughout.   ASSESSMENT:  [name] [name] continues to have refractory hypertension.  His  diastolic blood pressure is markedly elevated.  I rechecked his blood  pressure and it was 154/110.  I have increased his clonidine to 0.3 mg  twice daily.  He should continue on his other medications without  changes.  I am going to check a renal ultrasound to obtain further  information into [name] possibility of renal artery stenosis.  I would like  to see [name] [name] back in four weeks for follow-up.  He is going for  lab work to have his Dilantin level checked later today at [facility].  I  have added on a basic metabolic panel to make sure that his renal  function and electrolytes are stable.     Veverly Fells. Excell Seltzer, MD  Electronically Signed    MDC/MedQ  DD: 11/19/2006  DT: 11/20/2006  Job #:  [id number] 

## 2010-08-28 ENCOUNTER — Encounter: Payer: Self-pay | Admitting: Cardiovascular Disease

## 2010-11-13 LAB — BASIC METABOLIC PANEL
BUN: 23
BUN: 27 — ABNORMAL HIGH
BUN: 27 — ABNORMAL HIGH
BUN: 27 — ABNORMAL HIGH
BUN: 20
BUN: 24 — ABNORMAL HIGH
BUN: 25 — ABNORMAL HIGH
BUN: 25 — ABNORMAL HIGH
BUN: 25 — ABNORMAL HIGH
BUN: 28 — ABNORMAL HIGH
BUN: 32 — ABNORMAL HIGH
CO2: 21
CO2: 23
CO2: 23
CO2: 26
CO2: 21
CO2: 23
CO2: 23
CO2: 24
CO2: 26
CO2: 28
CO2: 28
Calcium: 8.4
Calcium: 8.5
Calcium: 8.6
Calcium: 8.1 — ABNORMAL LOW
Calcium: 8.4
Calcium: 8.5
Calcium: 8.5
Calcium: 8.7
Calcium: 8.7
Calcium: 8.8
Chloride: 108
Chloride: 110
Chloride: 110
Chloride: 103
Chloride: 104
Chloride: 105
Chloride: 106
Chloride: 108
Chloride: 111
Chloride: 111
Creatinine, Ser: 1.58 — ABNORMAL HIGH
Creatinine, Ser: 1.63 — ABNORMAL HIGH
Creatinine, Ser: 1.8 — ABNORMAL HIGH
Creatinine, Ser: 1.51 — ABNORMAL HIGH
Creatinine, Ser: 1.56 — ABNORMAL HIGH
Creatinine, Ser: 1.68 — ABNORMAL HIGH
Creatinine, Ser: 1.71 — ABNORMAL HIGH
Creatinine, Ser: 1.76 — ABNORMAL HIGH
Creatinine, Ser: 1.78 — ABNORMAL HIGH
Creatinine, Ser: 1.85 — ABNORMAL HIGH
GFR calc Af Amer: 49 — ABNORMAL LOW
GFR calc Af Amer: 54 — ABNORMAL LOW
GFR calc Af Amer: 47 — ABNORMAL LOW
GFR calc Af Amer: 49 — ABNORMAL LOW
GFR calc Af Amer: 50 — ABNORMAL LOW
GFR calc Af Amer: 52 — ABNORMAL LOW
GFR calc Af Amer: 53 — ABNORMAL LOW
GFR calc Af Amer: 57 — ABNORMAL LOW
GFR calc Af Amer: 59 — ABNORMAL LOW
GFR calc non Af Amer: 40 — ABNORMAL LOW
GFR calc non Af Amer: 45 — ABNORMAL LOW
GFR calc non Af Amer: 47 — ABNORMAL LOW
GFR calc non Af Amer: 39 — ABNORMAL LOW
GFR calc non Af Amer: 41 — ABNORMAL LOW
GFR calc non Af Amer: 41 — ABNORMAL LOW
GFR calc non Af Amer: 43 — ABNORMAL LOW
GFR calc non Af Amer: 43 — ABNORMAL LOW
GFR calc non Af Amer: 47 — ABNORMAL LOW
GFR calc non Af Amer: 49 — ABNORMAL LOW
Glucose, Bld: 107 — ABNORMAL HIGH
Glucose, Bld: 84
Glucose, Bld: 96
Glucose, Bld: 105 — ABNORMAL HIGH
Glucose, Bld: 112 — ABNORMAL HIGH
Glucose, Bld: 112 — ABNORMAL HIGH
Glucose, Bld: 120 — ABNORMAL HIGH
Glucose, Bld: 121 — ABNORMAL HIGH
Glucose, Bld: 160 — ABNORMAL HIGH
Glucose, Bld: 95
Potassium: 4.3
Potassium: 4.5
Potassium: 4
Potassium: 4
Potassium: 4.2
Potassium: 4.2
Potassium: 4.3
Potassium: 4.4
Potassium: 4.6
Sodium: 138
Sodium: 139
Sodium: 134 — ABNORMAL LOW
Sodium: 135
Sodium: 135
Sodium: 138
Sodium: 138
Sodium: 139
Sodium: 145

## 2010-11-13 LAB — DIFFERENTIAL
Basophils Absolute: 0
Basophils Absolute: 0
Basophils Absolute: 0
Basophils Relative: 0
Basophils Relative: 0
Basophils Relative: 0
Eosinophils Absolute: 0
Eosinophils Absolute: 0
Eosinophils Absolute: 0
Eosinophils Relative: 0
Eosinophils Relative: 0
Eosinophils Relative: 0
Lymphocytes Relative: 6 — ABNORMAL LOW
Lymphocytes Relative: 8 — ABNORMAL LOW
Lymphocytes Relative: 8 — ABNORMAL LOW
Lymphs Abs: 0.6 — ABNORMAL LOW
Lymphs Abs: 0.8
Lymphs Abs: 1.3
Monocytes Absolute: 0.7
Monocytes Absolute: 1.1 — ABNORMAL HIGH
Monocytes Absolute: 1.3 — ABNORMAL HIGH
Monocytes Relative: 11
Monocytes Relative: 7
Monocytes Relative: 8
Neutro Abs: 13.2 — ABNORMAL HIGH
Neutro Abs: 8 — ABNORMAL HIGH
Neutro Abs: 9.5 — ABNORMAL HIGH
Neutrophils Relative %: 81 — ABNORMAL HIGH
Neutrophils Relative %: 84 — ABNORMAL HIGH
Neutrophils Relative %: 88 — ABNORMAL HIGH

## 2010-11-13 LAB — PHENYTOIN LEVEL, TOTAL
Phenytoin Lvl: 13.9
Phenytoin Lvl: 12.8
Phenytoin Lvl: 13.9
Phenytoin Lvl: 15.1
Phenytoin Lvl: 16.3
Phenytoin Lvl: 16.6
Phenytoin Lvl: 19

## 2010-11-13 LAB — COMPREHENSIVE METABOLIC PANEL
ALT: 85 — ABNORMAL HIGH
ALT: 88 — ABNORMAL HIGH
AST: 109 — ABNORMAL HIGH
AST: 39 — ABNORMAL HIGH
Albumin: 2.4 — ABNORMAL LOW
Albumin: 3.1 — ABNORMAL LOW
Alkaline Phosphatase: 108
Alkaline Phosphatase: 95
BUN: 23
BUN: 25 — ABNORMAL HIGH
CO2: 23
CO2: 28
Calcium: 8.7
Calcium: 8.8
Chloride: 101
Chloride: 102
Creatinine, Ser: 1.49
Creatinine, Ser: 2.06 — ABNORMAL HIGH
GFR calc Af Amer: 42 — ABNORMAL LOW
GFR calc Af Amer: >60
GFR calc non Af Amer: 34 — ABNORMAL LOW
GFR calc non Af Amer: 50 — ABNORMAL LOW
Glucose, Bld: 108 — ABNORMAL HIGH
Glucose, Bld: 123 — ABNORMAL HIGH
Potassium: 3.6
Potassium: 3.7
Sodium: 137
Sodium: 139
Total Bilirubin: 0.4
Total Bilirubin: 1.6 — ABNORMAL HIGH
Total Protein: 5.3 — ABNORMAL LOW
Total Protein: 7.2

## 2010-11-13 LAB — CBC
HCT: 41.3
HCT: 42.8
HCT: 45.2
HCT: 49.6
Hemoglobin: 13.8
Hemoglobin: 14.4
Hemoglobin: 14.9
Hemoglobin: 16.9
MCHC: 33.2
MCHC: 32.9
MCHC: 33.4
MCHC: 33.5
MCHC: 34
MCV: 102 — ABNORMAL HIGH
MCV: 103.2 — ABNORMAL HIGH
MCV: 103.9 — ABNORMAL HIGH
MCV: 104 — ABNORMAL HIGH
Platelets: 188
Platelets: 175
Platelets: 204
Platelets: 208
Platelets: 234
RBC: 3.97 — ABNORMAL LOW
RBC: 4.14 — ABNORMAL LOW
RBC: 4.36
RBC: 4.86
RDW: 13
RDW: 12.8
RDW: 12.9
RDW: 13.3
RDW: 13.4
WBC: 10.9 — ABNORMAL HIGH
WBC: 11.1 — ABNORMAL HIGH
WBC: 15.8 — ABNORMAL HIGH
WBC: 9.9

## 2010-11-13 LAB — URINE MICROSCOPIC-ADD ON

## 2010-11-13 LAB — URINE CULTURE
Colony Count: >=100000
Special Requests: NEGATIVE

## 2010-11-13 LAB — ABO/RH: ABO/RH(D): A POS

## 2010-11-13 LAB — CROSSMATCH
ABO/RH(D): A POS
Antibody Screen: NEGATIVE

## 2010-11-13 LAB — URINALYSIS, ROUTINE W REFLEX MICROSCOPIC
Bilirubin Urine: NEGATIVE
Glucose, UA: NEGATIVE
Ketones, ur: NEGATIVE
Nitrite: NEGATIVE
Protein, ur: 30 — AB
Specific Gravity, Urine: 1.011
Urobilinogen, UA: 1
pH: 6.5

## 2010-11-13 LAB — PROTIME-INR
INR: 0.9
Prothrombin Time: 12.5

## 2010-11-13 LAB — SODIUM: Sodium: 134 — ABNORMAL LOW

## 2010-11-13 LAB — APTT: aPTT: 25

## 2011-08-07 ENCOUNTER — Inpatient Hospital Stay (HOSPITAL_COMMUNITY)
Admission: EM | Admit: 2011-08-07 | Discharge: 2011-08-15 | DRG: 064 | Disposition: A | Discharge status: Other Acute Inpatient Hospital | Payer: Medicare Other | Source: Other Acute Inpatient Hospital | Attending: Pulmonary Disease | Admitting: Pulmonary Disease

## 2011-08-07 ENCOUNTER — Inpatient Hospital Stay (HOSPITAL_COMMUNITY): Payer: Medicare Other

## 2011-08-07 ENCOUNTER — Encounter (HOSPITAL_COMMUNITY): Payer: Self-pay | Admitting: Radiology

## 2011-08-07 DIAGNOSIS — J9600 Acute respiratory failure, unspecified whether with hypoxia or hypercapnia: Secondary | ICD-10-CM

## 2011-08-07 DIAGNOSIS — D509 Iron deficiency anemia, unspecified: Secondary | ICD-10-CM | POA: Diagnosis present

## 2011-08-07 DIAGNOSIS — F1010 Alcohol abuse, uncomplicated: Secondary | ICD-10-CM | POA: Diagnosis present

## 2011-08-07 DIAGNOSIS — M311 Thrombotic microangiopathy: Secondary | ICD-10-CM

## 2011-08-07 DIAGNOSIS — D649 Anemia, unspecified: Secondary | ICD-10-CM | POA: Diagnosis present

## 2011-08-07 DIAGNOSIS — D696 Thrombocytopenia, unspecified: Secondary | ICD-10-CM | POA: Diagnosis present

## 2011-08-07 DIAGNOSIS — I248 Other forms of acute ischemic heart disease: Secondary | ICD-10-CM | POA: Diagnosis present

## 2011-08-07 DIAGNOSIS — Z8673 Personal history of transient ischemic attack (TIA), and cerebral infarction without residual deficits: Secondary | ICD-10-CM

## 2011-08-07 DIAGNOSIS — I5032 Chronic diastolic (congestive) heart failure: Secondary | ICD-10-CM | POA: Diagnosis present

## 2011-08-07 DIAGNOSIS — E46 Unspecified protein-calorie malnutrition: Secondary | ICD-10-CM | POA: Diagnosis present

## 2011-08-07 DIAGNOSIS — S065XAA Traumatic subdural hemorrhage with loss of consciousness status unknown, initial encounter: Secondary | ICD-10-CM | POA: Diagnosis present

## 2011-08-07 DIAGNOSIS — M109 Gout, unspecified: Secondary | ICD-10-CM | POA: Diagnosis present

## 2011-08-07 DIAGNOSIS — N183 Chronic kidney disease, stage 3 unspecified: Secondary | ICD-10-CM | POA: Diagnosis present

## 2011-08-07 DIAGNOSIS — M3119 Other thrombotic microangiopathy: Secondary | ICD-10-CM

## 2011-08-07 DIAGNOSIS — G9340 Encephalopathy, unspecified: Secondary | ICD-10-CM | POA: Diagnosis present

## 2011-08-07 DIAGNOSIS — R579 Shock, unspecified: Secondary | ICD-10-CM | POA: Diagnosis present

## 2011-08-07 DIAGNOSIS — I509 Heart failure, unspecified: Secondary | ICD-10-CM | POA: Diagnosis present

## 2011-08-07 DIAGNOSIS — N179 Acute kidney failure, unspecified: Secondary | ICD-10-CM

## 2011-08-07 DIAGNOSIS — G40909 Epilepsy, unspecified, not intractable, without status epilepticus: Secondary | ICD-10-CM | POA: Diagnosis present

## 2011-08-07 DIAGNOSIS — I129 Hypertensive chronic kidney disease with stage 1 through stage 4 chronic kidney disease, or unspecified chronic kidney disease: Secondary | ICD-10-CM | POA: Diagnosis present

## 2011-08-07 DIAGNOSIS — I10 Essential (primary) hypertension: Secondary | ICD-10-CM | POA: Diagnosis present

## 2011-08-07 DIAGNOSIS — D320 Benign neoplasm of cerebral meninges: Secondary | ICD-10-CM

## 2011-08-07 DIAGNOSIS — I2489 Other forms of acute ischemic heart disease: Secondary | ICD-10-CM | POA: Diagnosis present

## 2011-08-07 DIAGNOSIS — F17200 Nicotine dependence, unspecified, uncomplicated: Secondary | ICD-10-CM

## 2011-08-07 DIAGNOSIS — I6200 Nontraumatic subdural hemorrhage, unspecified: Principal | ICD-10-CM | POA: Diagnosis present

## 2011-08-07 DIAGNOSIS — R6510 Systemic inflammatory response syndrome (SIRS) of non-infectious origin without acute organ dysfunction: Secondary | ICD-10-CM | POA: Diagnosis present

## 2011-08-07 DIAGNOSIS — Z9119 Patient's noncompliance with other medical treatment and regimen: Secondary | ICD-10-CM

## 2011-08-07 DIAGNOSIS — B1920 Unspecified viral hepatitis C without hepatic coma: Secondary | ICD-10-CM | POA: Diagnosis present

## 2011-08-07 DIAGNOSIS — I701 Atherosclerosis of renal artery: Secondary | ICD-10-CM

## 2011-08-07 DIAGNOSIS — R569 Unspecified convulsions: Secondary | ICD-10-CM | POA: Diagnosis present

## 2011-08-07 DIAGNOSIS — I5030 Unspecified diastolic (congestive) heart failure: Secondary | ICD-10-CM

## 2011-08-07 DIAGNOSIS — E872 Acidosis, unspecified: Secondary | ICD-10-CM

## 2011-08-07 DIAGNOSIS — I6350 Cerebral infarction due to unspecified occlusion or stenosis of unspecified cerebral artery: Secondary | ICD-10-CM

## 2011-08-07 DIAGNOSIS — E876 Hypokalemia: Secondary | ICD-10-CM | POA: Diagnosis not present

## 2011-08-07 DIAGNOSIS — Z91199 Patient's noncompliance with other medical treatment and regimen due to unspecified reason: Secondary | ICD-10-CM

## 2011-08-07 DIAGNOSIS — R578 Other shock: Secondary | ICD-10-CM | POA: Diagnosis present

## 2011-08-07 DIAGNOSIS — R4182 Altered mental status, unspecified: Secondary | ICD-10-CM | POA: Diagnosis present

## 2011-08-07 DIAGNOSIS — E860 Dehydration: Secondary | ICD-10-CM | POA: Diagnosis present

## 2011-08-07 DIAGNOSIS — S065X9A Traumatic subdural hemorrhage with loss of consciousness of unspecified duration, initial encounter: Secondary | ICD-10-CM | POA: Diagnosis present

## 2011-08-07 LAB — URINALYSIS, ROUTINE W REFLEX MICROSCOPIC
Ketones, ur: 15 mg/dL — AB
Protein, ur: NEGATIVE mg/dL
Urobilinogen, UA: 0.2 mg/dL (ref 0.0–1.0)

## 2011-08-07 LAB — CBC WITH DIFFERENTIAL/PLATELET
Basophils Absolute: 0 10*3/uL (ref 0.0–0.1)
HCT: 20.7 % — ABNORMAL LOW (ref 39.0–52.0)
Lymphocytes Relative: 10 % — ABNORMAL LOW (ref 12–46)
Monocytes Relative: 10 % (ref 3–12)
Platelets: 60 10*3/uL — ABNORMAL LOW (ref 150–400)
RDW: 14.3 % (ref 11.5–15.5)
WBC: 9.6 10*3/uL (ref 4.0–10.5)

## 2011-08-07 LAB — CARDIAC PANEL(CRET KIN+CKTOT+MB+TROPI)
Relative Index: 0.7 (ref 0.0–2.5)
Troponin I: 0.39 ng/mL (ref <0.30)

## 2011-08-07 LAB — BASIC METABOLIC PANEL
BUN: 45 mg/dL — ABNORMAL HIGH (ref 6–23)
CO2: 9 mEq/L — CL (ref 19–32)
Calcium: 5.3 mg/dL — CL (ref 8.4–10.5)
Calcium: 5.5 mg/dL — CL (ref 8.4–10.5)
Chloride: 119 mEq/L — ABNORMAL HIGH (ref 96–112)
Creatinine, Ser: 4.11 mg/dL — ABNORMAL HIGH (ref 0.50–1.35)
GFR calc Af Amer: 17 mL/min — ABNORMAL LOW (ref >90)
GFR calc non Af Amer: 15 mL/min — ABNORMAL LOW (ref >90)
GFR calc non Af Amer: 15 mL/min — ABNORMAL LOW (ref >90)
Glucose, Bld: 120 mg/dL — ABNORMAL HIGH (ref 70–99)
Glucose, Bld: 122 mg/dL — ABNORMAL HIGH (ref 70–99)
Potassium: 2.7 mEq/L — CL (ref 3.5–5.1)
Sodium: 143 mEq/L (ref 135–145)
Sodium: 143 mEq/L (ref 135–145)

## 2011-08-07 LAB — MAGNESIUM
Magnesium: 1.9 mg/dL (ref 1.5–2.5)
Magnesium: 1.5 mg/dL (ref 1.5–2.5)

## 2011-08-07 LAB — PREPARE RBC (CROSSMATCH)

## 2011-08-07 LAB — BLOOD GAS, ARTERIAL
Bicarbonate: 7.6 mEq/L — ABNORMAL LOW (ref 20.0–24.0)
Bicarbonate: 8.3 mEq/L — ABNORMAL LOW (ref 20.0–24.0)
MECHVT: 600 mL
PEEP: 5 cmH2O
Patient temperature: 98.6
TCO2: 8 mmol/L (ref 0–100)
TCO2: 8.8 mmol/L (ref 0–100)
pCO2 arterial: 13.2 mmHg — CL (ref 35.0–45.0)
pCO2 arterial: 16 mmHg — CL (ref 35.0–45.0)
pH, Arterial: 7.382 (ref 7.350–7.450)
pH, Arterial: 7.336 — ABNORMAL LOW (ref 7.350–7.450)

## 2011-08-07 LAB — AMMONIA: Ammonia: 126 umol/L — ABNORMAL HIGH (ref 11–60)

## 2011-08-07 LAB — PHOSPHORUS: Phosphorus: 1.9 mg/dL — ABNORMAL LOW (ref 2.3–4.6)

## 2011-08-07 LAB — COMPREHENSIVE METABOLIC PANEL
AST: 49 U/L — ABNORMAL HIGH (ref 0–37)
Albumin: 1.4 g/dL — ABNORMAL LOW (ref 3.5–5.2)
Alkaline Phosphatase: 91 U/L (ref 39–117)
Chloride: 115 mEq/L — ABNORMAL HIGH (ref 96–112)
Potassium: 2.7 mEq/L — CL (ref 3.5–5.1)
Total Bilirubin: 0.5 mg/dL (ref 0.3–1.2)

## 2011-08-07 LAB — PROCALCITONIN
Procalcitonin: 0.99 ng/mL
Procalcitonin: 0.66 ng/mL

## 2011-08-07 LAB — HEMOGLOBIN AND HEMATOCRIT, BLOOD
HCT: 21.3 % — ABNORMAL LOW (ref 39.0–52.0)
Hemoglobin: 7.8 g/dL — ABNORMAL LOW (ref 13.0–17.0)

## 2011-08-07 LAB — GLUCOSE, CAPILLARY: Glucose-Capillary: 88 mg/dL (ref 70–99)

## 2011-08-07 LAB — CORTISOL: Cortisol, Plasma: 17.4 ug/dL

## 2011-08-07 LAB — OSMOLALITY: Osmolality: 313 mOsm/kg — ABNORMAL HIGH (ref 275–300)

## 2011-08-07 LAB — PROTIME-INR: Prothrombin Time: 15.8 seconds — ABNORMAL HIGH (ref 11.6–15.2)

## 2011-08-07 LAB — MRSA PCR SCREENING: MRSA by PCR: NEGATIVE

## 2011-08-07 LAB — URINE MICROSCOPIC-ADD ON

## 2011-08-07 LAB — PREALBUMIN: Prealbumin: 7.7 mg/dL — ABNORMAL LOW (ref 17.0–34.0)

## 2011-08-07 MED ORDER — PANTOPRAZOLE SODIUM 40 MG IV SOLR
40.0000 mg | INTRAVENOUS | Status: DC
  Administered 2011-08-07 – 2011-08-11 (×5): 40 mg via INTRAVENOUS
  Filled 2011-08-07 (×7): qty 40

## 2011-08-07 MED ORDER — POTASSIUM CHLORIDE 20 MEQ/15ML (10%) PO LIQD
ORAL | Status: AC
  Filled 2011-08-07: qty 30

## 2011-08-07 MED ORDER — VITAMIN B-1 100 MG PO TABS
100.0000 mg | ORAL_TABLET | Freq: Every day | ORAL | Status: DC
  Administered 2011-08-08 – 2011-08-15 (×8): 100 mg via ORAL
  Filled 2011-08-07 (×9): qty 1

## 2011-08-07 MED ORDER — CARVEDILOL 25 MG PO TABS
25.0000 mg | ORAL_TABLET | Freq: Two times a day (BID) | ORAL | Status: DC
Start: 2011-08-07 — End: 2011-08-07
  Filled 2011-08-07 (×3): qty 1

## 2011-08-07 MED ORDER — CHLORHEXIDINE GLUCONATE 0.12 % MT SOLN
15.0000 mL | Freq: Two times a day (BID) | OROMUCOSAL | Status: DC
  Administered 2011-08-07 – 2011-08-12 (×11): 15 mL via OROMUCOSAL
  Filled 2011-08-07 (×9): qty 15

## 2011-08-07 MED ORDER — POTASSIUM CHLORIDE 20 MEQ/15ML (10%) PO LIQD
40.0000 meq | Freq: Once | ORAL | Status: AC
  Administered 2011-08-07: 40 meq
  Filled 2011-08-07: qty 30

## 2011-08-07 MED ORDER — POTASSIUM CHLORIDE 10 MEQ/50ML IV SOLN
10.0000 meq | INTRAVENOUS | Status: AC
  Administered 2011-08-07 (×4): 10 meq via INTRAVENOUS
  Filled 2011-08-07 (×3): qty 50

## 2011-08-07 MED ORDER — MINOXIDIL 2.5 MG PO TABS
2.5000 mg | ORAL_TABLET | Freq: Two times a day (BID) | ORAL | Status: DC
Start: 2011-08-07 — End: 2011-08-07
  Filled 2011-08-07 (×2): qty 1

## 2011-08-07 MED ORDER — SODIUM CHLORIDE 0.9 % IV BOLUS (SEPSIS)
500.0000 mL | Freq: Once | INTRAVENOUS | Status: AC
  Administered 2011-08-07: 500 mL via INTRAVENOUS

## 2011-08-07 MED ORDER — ROCURONIUM BROMIDE 50 MG/5ML IV SOLN
0.4500 mg/kg | Freq: Once | INTRAVENOUS | Status: AC
  Administered 2011-08-07: 31.2 mg via INTRAVENOUS

## 2011-08-07 MED ORDER — ETOMIDATE 2 MG/ML IV SOLN
INTRAVENOUS | Status: AC
  Administered 2011-08-07: 20 mg
  Filled 2011-08-07: qty 10

## 2011-08-07 MED ORDER — SODIUM CHLORIDE 0.9 % IV SOLN
1250.0000 mg | Freq: Once | INTRAVENOUS | Status: AC
  Administered 2011-08-07: 1250 mg via INTRAVENOUS
  Filled 2011-08-07: qty 25

## 2011-08-07 MED ORDER — ISOSORB DINITRATE-HYDRALAZINE 20-37.5 MG PO TABS
2.0000 | ORAL_TABLET | Freq: Three times a day (TID) | ORAL | Status: DC
  Filled 2011-08-07 (×3): qty 2

## 2011-08-07 MED ORDER — SODIUM CHLORIDE 0.9 % IV SOLN
1.0000 g | Freq: Once | INTRAVENOUS | Status: AC
  Administered 2011-08-07: 1 g via INTRAVENOUS
  Filled 2011-08-07: qty 10

## 2011-08-07 MED ORDER — AMLODIPINE BESYLATE 5 MG PO TABS
5.0000 mg | ORAL_TABLET | Freq: Every day | ORAL | Status: DC
  Filled 2011-08-07: qty 1

## 2011-08-07 MED ORDER — PRO-STAT SUGAR FREE PO LIQD
30.0000 mL | Freq: Every day | ORAL | Status: DC
  Administered 2011-08-08 – 2011-08-10 (×3): 30 mL
  Filled 2011-08-07 (×4): qty 30

## 2011-08-07 MED ORDER — OSMOLITE 1.2 CAL PO LIQD
1000.0000 mL | ORAL | Status: DC
  Administered 2011-08-07 – 2011-08-09 (×2): 1000 mL
  Filled 2011-08-07 (×6): qty 1000

## 2011-08-07 MED ORDER — NOREPINEPHRINE BITARTRATE 1 MG/ML IJ SOLN
8.0000 ug/min | INTRAVENOUS | Status: DC
  Filled 2011-08-07 (×2): qty 4

## 2011-08-07 MED ORDER — SPIRONOLACTONE 25 MG PO TABS
25.0000 mg | ORAL_TABLET | Freq: Every day | ORAL | Status: DC
  Filled 2011-08-07: qty 1

## 2011-08-07 MED ORDER — PHENYTOIN SODIUM EXTENDED 100 MG PO CAPS
200.0000 mg | ORAL_CAPSULE | Freq: Two times a day (BID) | ORAL | Status: DC
Start: 2011-08-07 — End: 2011-08-07
  Filled 2011-08-07 (×2): qty 2

## 2011-08-07 MED ORDER — PANTOPRAZOLE SODIUM 40 MG IV SOLR: 40.0000 mg | INTRAVENOUS | Status: DC

## 2011-08-07 MED ORDER — LORAZEPAM 2 MG/ML IJ SOLN: 2.0000 mg | INTRAMUSCULAR | Status: DC | PRN

## 2011-08-07 MED ORDER — SODIUM CHLORIDE 0.9 % IV SOLN
750.0000 mL | INTRAVENOUS | Status: DC | PRN
  Administered 2011-08-07 (×3): 750 mL via INTRAVENOUS

## 2011-08-07 MED ORDER — LISINOPRIL 20 MG PO TABS
20.0000 mg | ORAL_TABLET | Freq: Every day | ORAL | Status: DC
  Filled 2011-08-07: qty 1

## 2011-08-07 MED ORDER — SODIUM CHLORIDE 0.9 % IV SOLN
INTRAVENOUS | Status: DC
  Administered 2011-08-07: 100 mL/h via INTRAVENOUS

## 2011-08-07 MED ORDER — SODIUM BICARBONATE 8.4 % IV SOLN
INTRAVENOUS | Status: AC
  Administered 2011-08-07: 25 meq
  Filled 2011-08-07: qty 50

## 2011-08-07 MED ORDER — FOLIC ACID 1 MG PO TABS
1.0000 mg | ORAL_TABLET | Freq: Every day | ORAL | Status: DC
  Administered 2011-08-08 – 2011-08-15 (×8): 1 mg via ORAL
  Filled 2011-08-07 (×9): qty 1

## 2011-08-07 MED ORDER — SODIUM CHLORIDE 0.45 % IV SOLN
INTRAVENOUS | Status: DC
  Administered 2011-08-07 – 2011-08-09 (×4): via INTRAVENOUS
  Filled 2011-08-07 (×12): qty 100

## 2011-08-07 MED ORDER — BIOTENE DRY MOUTH MT LIQD
15.0000 mL | Freq: Four times a day (QID) | OROMUCOSAL | Status: DC
  Administered 2011-08-07 – 2011-08-15 (×28): 15 mL via OROMUCOSAL

## 2011-08-07 MED ORDER — SODIUM CHLORIDE 0.9 % IV SOLN
200.0000 mg | Freq: Two times a day (BID) | INTRAVENOUS | Status: DC
  Administered 2011-08-07 – 2011-08-12 (×11): 200 mg via INTRAVENOUS
  Filled 2011-08-07 (×22): qty 4

## 2011-08-07 MED ORDER — NOREPINEPHRINE BITARTRATE 1 MG/ML IJ SOLN
2.0000 ug/min | INTRAVENOUS | Status: DC
  Filled 2011-08-07 (×2): qty 8

## 2011-08-07 MED ORDER — NOREPINEPHRINE BITARTRATE 1 MG/ML IJ SOLN
2.0000 ug/min | INTRAVENOUS | Status: DC
  Administered 2011-08-07: 5 ug/min via INTRAVENOUS
  Administered 2011-08-08: 15 ug/min via INTRAVENOUS
  Filled 2011-08-07: qty 8

## 2011-08-07 MED ORDER — LORAZEPAM 2 MG/ML IJ SOLN
INTRAMUSCULAR | Status: AC
  Administered 2011-08-07: 2 mg
  Filled 2011-08-07: qty 1

## 2011-08-07 MED ORDER — POTASSIUM CHLORIDE 10 MEQ/50ML IV SOLN
10.0000 meq | INTRAVENOUS | Status: AC
  Administered 2011-08-07 (×4): 10 meq via INTRAVENOUS
  Filled 2011-08-07 (×4): qty 50

## 2011-08-07 MED ORDER — ESCITALOPRAM OXALATE 20 MG PO TABS
20.0000 mg | ORAL_TABLET | Freq: Every day | ORAL | Status: DC
  Administered 2011-08-08 – 2011-08-15 (×8): 20 mg via ORAL
  Filled 2011-08-07 (×9): qty 1

## 2011-08-07 MED ORDER — ASPIRIN EC 81 MG PO TBEC
81.0000 mg | DELAYED_RELEASE_TABLET | Freq: Every day | ORAL | Status: DC
  Filled 2011-08-07: qty 1

## 2011-08-07 MED ORDER — SODIUM CHLORIDE 0.9 % IV SOLN
500.0000 mg | Freq: Two times a day (BID) | INTRAVENOUS | Status: DC
  Administered 2011-08-07 – 2011-08-15 (×17): 500 mg via INTRAVENOUS
  Filled 2011-08-07 (×19): qty 5

## 2011-08-07 MED ORDER — SODIUM CHLORIDE 0.9 % IV SOLN
5.0000 mg/h | INTRAVENOUS | Status: DC
  Administered 2011-08-07 – 2011-08-08 (×2): 5 mg/h via INTRAVENOUS
  Filled 2011-08-07 (×2): qty 10

## 2011-08-07 MED ORDER — SODIUM CHLORIDE 0.9 % IV BOLUS (SEPSIS)
1000.0000 mL | Freq: Once | INTRAVENOUS | Status: AC
  Administered 2011-08-07: 1000 mL via INTRAVENOUS

## 2011-08-07 MED ORDER — POTASSIUM CHLORIDE 10 MEQ/50ML IV SOLN
10.0000 meq | INTRAVENOUS | Status: AC
  Administered 2011-08-07 (×4): 10 meq via INTRAVENOUS
  Filled 2011-08-07: qty 50
  Filled 2011-08-07: qty 150

## 2011-08-07 NOTE — Progress Notes (Signed)
eLink Physician-Brief Progress Note Patient Name: [name] DOB: [birth year] MRN: [id number]  Date of Service  08/07/2011   HPI/Events of Note     eICU Interventions   Neuro consult called - ? Non convulsive Status D/w dr Wynetta Emery & dr Thad Ranger EEG ordered   Intervention Category Minor Interventions: Communication with other healthcare providers and/or family  Derrill Bagnell V. 08/07/2011, 7:07 PM

## 2011-08-07 NOTE — Progress Notes (Signed)
Name: [name] MRN: [id number] DOB: 1955/02/09    LOS: 0  Referring Provider:  Transferred from [facility] Reason for Referral:  Altered mental status  PULMONARY / CRITICAL CARE MEDICINE   Brief patient description:  7/9  Brought to [facility] with altered mental status.  Head CT demonstrated small right subdural hematoma with 4 mm midline shift.  Labs demonstarted multiple metabolic derangements.Transferred to [facility] for farther management.  Interval/subjective Had a seizure ~530 am this am, treated w/ 1 mg ativan. [id number] PCCM team called to bedside for eval of Persistent hypotension not responding to volume challenge and decrease in MS. On bedside eval he is unresponsive. Will bite down when attempting to illicit gag, but has no other response to noxious stimuli. Currently SBP in 60s.   Vital Signs:  Reviewed  Temp:  [97.5 F (36.4 C)] 97.5 F (36.4 C) (07/09 0445) Pulse Rate:  [62-76] 66  (07/09 0800) Resp:  [12-18] 12  (07/09 0800) BP: (58-95)/(35-67) 69/37 mmHg (07/09 0800) SpO2:  [94 %-100 %] 94 % (07/09 0800) Weight:  [69.3 kg (152 lb 12.5 oz)] 69.3 kg (152 lb 12.5 oz) (07/09 0445) 5 liters n/c Physical Examination: General:  No distress Neuro:  Nonfocal, follows commands, cough / gag present, somewhat confused, slow to respond. HEENT:  PERRL Neck:  Supple, no JVD Cardiovascular:  RRR, no murmurs Lungs:  CTAB Abdomen:  Soft, nontender, bowel sounds present Musculoskeletal:  Moves all extremities, no edema Skin:  Intact  Active Problems:  NEOP, BNG, CEREBRAL MENINGES  HYPERTENSION, BENIGN ESSENTIAL  HYPERTENSION, KIDNEY DISEASE, UNCONTROLLED  SEIZURE DISORDER  Encephalopathy acute  Hepatitis C without mention of hepatic coma  Diastolic heart failure  Subdural hematoma  Hypokalemia  Thrombocytopenia  Acidosis  Anemia  Malnutrition  ASSESSMENT AND PLAN  PULMONARY  Lab 08/07/11 0455  PHART 7.382  PCO2ART 13.2*  PO2ART 160.0*  HCO3 7.6*  O2SAT 99.6   Ventilator  Settings:   CXR: (pending) ETT: 7/9>>>  A:  Acute Respiratory Failure in setting of Inability to protect airway s/p seizure and complicated by multiple metabolic derangements.  P:   Intubate/ventilate: protect airway F/u cxr and abg Send sputum culture   CARDIOVASCULAR  Lab 08/07/11 0530  TROPONINI 0.39*  LATICACIDVEN 5.3*  PROBNP --   ECG:  NA Lines:right IJ CVL 7/9>>> Left rad aline 7/9>>>  A: History of diastolic CHF, no evidence of exacerbation.  History of hypetension. Has mild elevated Trop I. Hard to interpret in the setting of renal failure.  P:  Cycle CEs Check BNP Will get ECHO  A:Circulatory Shock. Unclear etiology. May be residual benzo effect vs acidosis vs sepsis vs some mix of all three.   P: Check CVP, lactate, PCT, repeat ABG Volume for goal CVP 8-12 Levophed for MAP >65 Bicarb support to keep PH >7.2 Check cortisol Stop all antihypertensives IVF resuscitation.  RENAL  Lab 08/07/11 0530  NA 144  K 2.7*  CL 115*  CO2 9*  BUN 57*  CREATININE 5.27*  CALCIUM 6.9*  MG 1.9  PHOS --   Intake/Output      07/08 0701 - 07/09 0700 07/09 0701 - 07/10 0700   I.V. (mL/kg)  118.3 (1.7)   IV Piggyback 300    Total Intake(mL/kg) 300 (4.3) 118.3 (1.7)   Urine (mL/kg/hr) 90 (0.1)    Total Output 90    Net +210 +118.3         Foley:  7/9  A:  Acute on chronic renal failure (Cr  5.5), in setting of Dehydration. CKD and renal artery stenosis.  P: Renal adjust meds cvp monitoring and levophed to ensure adequate filling pressures and MAP.  Renal US Close I&O   Hypokalemia P Gentle replacement in setting of acidosis.   Gap/non-gap metabolic acidosis. Likely due to renal failure and hypoperfusion.  P: recheck lactate Bicarb infusion q 6 chemistry    GASTROINTESTINAL  Lab 08/07/11 0530  AST 49*  ALT 33  ALKPHOS 91  BILITOT 0.5  PROT 5.0*  ALBUMIN 1.4*    A:  History of hepatitis C.  Suspected malnutrition (Alb 1.4). P:    LFT Prealbumin May benefit from Nutritionist consult   HEMATOLOGIC  Lab 08/07/11 0530  HGB 7.5*  HCT 20.7*  PLT 60*  INR 1.23  APTT 37   A:  Mild anemia, macrocitic (Hb 8.0, MCV 109).  Thrombocytopenia (Plt 60).  Both likely alcohol related. P:  CBC APPT / INR given shock state will transfuse 1 unit  INFECTIOUS  Lab 08/07/11 0530  WBC 9.6  PROCALCITON 0.99   Cultures: BCX2 7/9>>> Sputum 7/9>>>  Antibiotics: None  A:  No evidence of acute infection. P:   Recheck PCT, low threshold for empiric rx.   ENDOCRINE  Lab 08/07/11 0424  GLUCAP 88   A:  No active issues P:   No intervention required  NEUROLOGIC  7/9  Head CT >>> Small right subdural hematoma with 4 mm midline shift.   A:  Acute encephalopathy.  History of alcohol abuse.  History of meningioma resection.  History of seizure disorder on Dilantin.  History of medical noncompliance. Had witnessed seizure in ICU. Post-ictal this am.  P:    Thiamine & Folate Continue Lexapro Hold Trazodone  Neurosurgery following Dilantin per pharmacy  BEST PRACTICE / DISPOSITION Level of Care:  ICU Primary Service:  PCCM Consultants:  Consulted by EDP at [facility] Code Status:  Full Diet:  NPO DVT Px:  SCD GI Px:  Protonix Skin Integrity:  Intact Social / Family:  Unavailable  The patient is critically ill with multiple organ systems failure and requires high complexity decision making for assessment and support, frequent evaluation and titration of therapies, application of advanced monitoring technologies and extensive interpretation of multiple databases. Critical Care Time devoted to patient care services described in this note is 60 minutes.   Anders Simmonds, M.D. Pulmonary and Critical Care Medicine [facility] Pager: [phone]  08/07/2011, 8:11 AM   Shock likely due to anti-HTN and acidosis, sepsis can not be ruled out however.  Patient was intubated and TLC placed.  Fluid resuscitation.  UOP  is starting to improve as patient is hydrated and pressure is improved.  Will check renal U/S and continue with fluid for now.  If renal function deteriorates further or UOP drops then will need renal to see patient.  Will also order a head CT for AM without contrast.  Total CC time excluding procedures 60 min.  Patient seen and examined, agree with above note.  I dictated the care and orders written for this patient under my direction.  [name], M.D. [phone]

## 2011-08-07 NOTE — H&P (Signed)
Name: [name] MRN: [id number] DOB: [birth year]    LOS: 0  Referring Provider:  Transferred from [facility] Reason for Referral:  Altered mental status  PULMONARY / CRITICAL CARE MEDICINE  The patient is encephalopathic and unable to provide history, which was obtained for available medical records.  HPI:  56 yo with history of seizure disorder, meningioma s/p resection, CVA, alcohol abuse and medical noncompliance brought to [facility] with altered mental status.  Head CT demonstrated small right subdural hematoma with 4 mm midline shift.  He was seen by Neurosurgery and deemed not requiring surgical intervention.  Labs demonstarted multiple metabolic derangements.  He was transferred to [facility] for further care.  Past Medical History  Diagnosis Date  . HTN (hypertension), malignant   . CKD (chronic kidney disease), stage III   . Renal artery stenosis     moderate  . Meningioma     s/p resection  . Seizure disorder   . Alcohol abuse     hx  . TIA (transient ischemic attack)   . Diastolic CHF, acute     acute?; echo 7/10 with EF 65%, moderate LVH  . HCV (hepatitis C virus)    Past Surgical History  Procedure Date  . Brain surgery     meningioma resection   Prior to Admission medications   Medication Sig Start Date End Date Taking? Authorizing Provider  amLODipine (NORVASC) 5 MG tablet Take 5 mg by mouth daily.      Historical Provider, MD  aspirin 81 MG EC tablet Take 81 mg by mouth daily.      Historical Provider, MD  carvedilol (COREG) 25 MG tablet Take 25 mg by mouth 2 (two) times daily.      Historical Provider, MD  colchicine 0.6 MG tablet Take 0.6 mg by mouth 2 (two) times daily as needed.      Historical Provider, MD  eplerenone (INSPRA) 50 MG tablet Take 50 mg by mouth daily.      Historical Provider, MD  escitalopram (LEXAPRO) 20 MG tablet Take 20 mg by mouth daily.      Historical Provider, MD  isosorbide-hydrALAZINE (BIDIL) 20-37.5 MG per tablet Take 2 tablets by mouth 3  (three) times daily.      Historical Provider, MD  lisinopril (PRINIVIL,ZESTRIL) 20 MG tablet Take 20 mg by mouth daily.      Historical Provider, MD  minoxidil (LONITEN) 2.5 MG tablet Take 2.5 mg by mouth 2 (two) times daily.      Historical Provider, MD  phenytoin (DILANTIN) 100 MG ER capsule Take 200 mg by mouth 2 (two) times daily.      Historical Provider, MD  traZODone (DESYREL) 100 MG tablet Take 100 mg by mouth at bedtime.      Historical Provider, MD   Allergies Allergies not on file  Family History No family history on file. Social History  does not have a smoking history on file. He does not have any smokeless tobacco history on file. His alcohol and drug histories not on file.  Review Of Systems:  Unable to provide.  Brief patient description:  56 yo with history of seizure disorder, meningioma s/p resection, CVA and alcohol abuse and medical noncompliance brought to [facility] with altered mental status.  Head CT demonstrated small right subdural hematoma with 4 mm midline shift.  He was seen by Neurosurgery and deemed not requiring surgical intervention.  Labs demonstarted multiple metabolic derangements. He was transferred to [facility] for further care.  Events Since  Admission: 7/9  Brought to [facility] with altered mental status.  Head CT demonstrated small right subdural hematoma with 4 mm midline shift.  Transferred to [facility] for farther management.  Current Status:  Vital Signs:  Reviewed     Physical Examination: General:  No distress Neuro:  Nonfocal, follows commands, cough / gag present, somewhat confused, slow to respond. HEENT:  PERRL Neck:  Supple, no JVD Cardiovascular:  RRR, no murmurs Lungs:  CTAB Abdomen:  Soft, nontender, bowel sounds present Musculoskeletal:  Moves all extremities, no edema Skin:  Intact  Active Problems:  NEOP, BNG, CEREBRAL MENINGES  HYPERTENSION, BENIGN ESSENTIAL  HYPERTENSION, KIDNEY DISEASE, UNCONTROLLED  SEIZURE DISORDER  Encephalopathy  acute  Hepatitis C without mention of hepatic coma  Diastolic heart failure  Subdural hematoma  Hypokalemia  Thrombocytopenia  Acidosis  Anemia  Malnutrition  ASSESSMENT AND PLAN  PULMONARY No results found for this basename: PHART:5,PCO2:5,PCO2ART:5,PO2ART:5,HCO3:5,O2SAT:5 in the last 168 hours Ventilator Settings:   CXR:  NA ETT:  NA  A:  No active issues.  Protects airway. P:   No intervention required  CARDIOVASCULAR No results found for this basename: TROPONINI:5,LATICACIDVEN:5, O2SATVEN:5,PROBNP:5 in the last 168 hours ECG:  NA Lines: NA  A: History of diastolic CHF, no evidence of exacerbation.  History of hypertension.  Hemodynamically stable.  No arrhythmia. P:  Continue preadmission Rx, except holding Eplerenone (dehydration) and Lisinopril (acute renal failure) and ASA (subdural hematoma)  RENAL No results found for this basename: NA:5,K:2,CL:5,CO2:5,BUN:5,CREATININE:5,CALCIUM:5,MG:5,PHOS:5 in the last 168 hours Intake/Output    None    Foley:  NA  A:  Acute on chronic renal failure (Cr 5.5).  Dehydration.  Hypokalemia (K1.8) - receiving replacement. CKD and renal artery stenosis.  Increase AG (19) acidosis (HCO3 10).  Uremia (BUN 56). P:   BMP ABG IVF NS 1000 mL bolus, then 100 mL/h Serum osm May need Renal consult  GASTROINTESTINAL No results found for this basename: AST:5,ALT:5,ALKPHOS:5,BILITOT:5,PROT:5,ALBUMIN:5 in the last 168 hours  A:  History of hepatitis C.  Suspected malnutrition (Alb 1.4). P:   LFT Bedside swallow evaluation, diet if passes Prealbumin May benefit from Nutritionist consult   HEMATOLOGIC No results found for this basename: HGB:5,HCT:5,PLT:5,INR:5,APTT:5 in the last 168 hours A:  Mild anemia, macrocitic (Hb 8.0, MCV 109).  Thrombocytopenia (Plt 60).  Both likely alcohol related. P:  CBC APPT / INR  INFECTIOUS No results found for this basename: WBC:5,PROCALCITON:5 in the last 168  hours Cultures: None Antibiotics: None  A:  No evidence of acute infection. P:   PCT level No intervention required  ENDOCRINE  Lab 08/07/11 0424  GLUCAP 88   A:  No active issues P:   No intervention required  NEUROLOGIC  7/9  Head CT >>> Small right subdural hematoma with 4 mm midline shift.   A:  Acute encephalopathy.  History of alcohol abuse.  History of meningioma resection.  History of seizure disorder on Dilantin.  History of medical noncompliance.  No active seasures but possibly postictal. P:   Tox / alcohol screen Ammonia level Dilantin level Dilantin per home regimen, may need load based on level Thiamine Folate Continue Lexapro Hold Trazodone  Neurosurgery following CIWA q4h  BEST PRACTICE / DISPOSITION Level of Care:  ICU Primary Service:  PCCM Consultants:  Consulted by EDP at [facility] Code Status:  Full Diet:  NPO DVT Px:  SCD GI Px:  Protonix Skin Integrity:  Intact Social / Family:  Unavailable  The patient is critically ill with multiple organ systems  failure and requires high complexity decision making for assessment and support, frequent evaluation and titration of therapies, application of advanced monitoring technologies and extensive interpretation of multiple databases. Critical Care Time devoted to patient care services described in this note is 35 minutes.   Lonia Farber, M.D. Pulmonary and Critical Care Medicine [facility] Pager: [phone]  08/07/2011, 4:35 AM

## 2011-08-07 NOTE — Procedures (Signed)
Central Venous Catheter Insertion Procedure Note [name] [id number] 10-09-1955  Procedure: Insertion of Central Venous Catheter Indications: Assessment of intravascular volume  Procedure Details Consent: Unable to obtain consent because of emergent medical necessity. Time Out: Verified patient identification, verified procedure, site/side was marked, verified correct patient position, special equipment/implants available, medications/allergies/relevent history reviewed, required imaging and test results available.  Performed Real time [place] used to ID and cannulate the vessel Maximum sterile technique was used including antiseptics, cap, gloves, gown, hand hygiene, mask and sheet. Skin prep: Chlorhexidine; local anesthetic administered A antimicrobial bonded/coated triple lumen catheter was placed in the right internal jugular vein using the Seldinger technique.  Evaluation Blood flow good Complications: No apparent complications Patient did tolerate procedure well. Chest X-ray ordered to verify placement.  CXR: pending.  BABCOCK,PETE 08/07/2011, 9:14 AM   Patient seen and examined, agree with above note.  I dictated the care and orders written for this patient under my direction.  [name], M.D. [phone]

## 2011-08-07 NOTE — Progress Notes (Signed)
INITIAL ADULT NUTRITION ASSESSMENT Date: 08/07/2011   Time: 10:39 AM Reason for Assessment: TF Management Consult  INTERVENTION: Initiate Osmolite 1.2 @ 15 ml/hr via panda, once placed, and increase by 10 ml every 12 hours to goal rate of 55 ml/hr. 30 ml Prostat daily.  At goal rate, tube feeding regimen will provide 1684 kcal, 88 grams of protein, and 1082 ml of H2O.   Unable to determine risk of refeeding syndrome and since pt continues to receive fluid resuscitation will start TF at low rate using low residue formula and increase slowly until tolerance established.    ASSESSMENT: Male 56 y.o.  Dx: Transfer from [facility] due to altered mental status  Hx:  Past Medical History  Diagnosis Date  . HTN (hypertension), malignant   . CKD (chronic kidney disease), stage III   . Renal artery stenosis     moderate  . Meningioma     s/p resection  . Seizure disorder   . Alcohol abuse     hx  . TIA (transient ischemic attack)   . Diastolic CHF, acute     acute?; echo 7/10 with EF 65%, moderate LVH  . HCV (hepatitis C virus)    Past Surgical History  Procedure Date  . Brain surgery     meningioma resection   Related Meds:     . escitalopram  20 mg Oral Daily  . etomidate      . folic acid  1 mg Oral Daily  . isosorbide-hydrALAZINE  2 tablet Oral TID  . levetiracetam  500 mg Intravenous Q12H  . LORazepam      . pantoprazole (PROTONIX) IV  40 mg Intravenous Q24H  . phenytoin (DILANTIN) IV  1,250 mg Intravenous Once  . phenytoin (DILANTIN) IV  200 mg Intravenous Q12H  . potassium chloride  10 mEq Intravenous Q1 Hr x 4  . rocuronium  0.45 mg/kg (Order-Specific) Intravenous Once  . sodium bicarbonate      . sodium chloride  1,000 mL Intravenous Once  . sodium chloride  500 mL Intravenous Once  . sodium chloride  500 mL Intravenous Once  . thiamine  100 mg Oral Daily  . DISCONTD: amLODipine  5 mg Oral Daily  . DISCONTD: aspirin EC  81 mg Oral Daily  . DISCONTD: carvedilol  25 mg  Oral BID WC  . DISCONTD: lisinopril  20 mg Oral Daily  . DISCONTD: minoxidil  2.5 mg Oral BID  . DISCONTD: pantoprazole (PROTONIX) IV  40 mg Intravenous Q24H  . DISCONTD: phenytoin  200 mg Oral BID  . DISCONTD: spironolactone  25 mg Oral Daily   Ht: 5\' 11"  (180.3 cm)  Wt: 152 lb 12.5 oz (69.3 kg)  Ideal Wt: 78.1 kg % Ideal Wt: 89%  Usual Wt:  Wt Readings from Last 10 Encounters:  08/07/11 152 lb 12.5 oz (69.3 kg)  05/18/09 171 lb 12 oz (77.905 kg)  10/15/08 176 lb (79.833 kg)  08/18/08 176 lb (79.833 kg)  10/21/06 169 lb (76.658 kg)   % Usual Wt: 89% from 2011  Body mass index is 21.31 kg/(m^2). WNL  Food/Nutrition Related Hx:  Nutrition history is unclear. No family in town. Per RN sister is on the train, coming from [facility]. Pt does have a unclear history. Appears pt has lost about 20 lbs since 2011. Pt has history of alcohol abuse and medical non-compliance. Pt does not have a wasted appearance.    Labs:  CMP     Component Value Date/Time  NA 144 08/07/2011 0530   K 2.7* 08/07/2011 0530   CL 115* 08/07/2011 0530   CO2 9* 08/07/2011 0530   GLUCOSE 106* 08/07/2011 0530   BUN 57* 08/07/2011 0530   CREATININE 5.27* 08/07/2011 0530   CALCIUM 6.9* 08/07/2011 0530   PROT 5.0* 08/07/2011 0530   ALBUMIN 1.4* 08/07/2011 0530   AST 49* 08/07/2011 0530   ALT 33 08/07/2011 0530   ALKPHOS 91 08/07/2011 0530   BILITOT 0.5 08/07/2011 0530   GFRNONAA 11* 08/07/2011 0530   GFRAA 13* 08/07/2011 0530   CBG (last 3)   Basename 08/07/11 0424  GLUCAP 88   No results found for this basename: phos   Magnesium  Date/Time Value Range Status  08/07/2011  5:30 AM 1.9  1.5 - 2.5 mg/dL Final   Ammonia  Date/Time Value Range Status  08/07/2011  5:30 AM 126* 11 - 60 umol/L Final    Intake/Output Summary (Last 24 hours) at 08/07/11 1048 Last data filed at 08/07/11 0800  Gross per 24 hour  Intake 418.34 ml  Output     90 ml  Net 328.34 ml   Diet Order: NPO  Supplements/Tube Feeding: none  IVF:     norepinephrine (LEVOPHED) Adult infusion   norepinephrine (LEVOPHED) Adult infusion   norepinephrine (LEVOPHED) Adult infusion   sodium bicarbonate infusion 1000 mL Last Rate: 100 mL/hr at 08/07/11 0756  DISCONTD: sodium chloride Last Rate: 100 mL/hr (08/07/11 4098)   Patient is currently intubated on ventilator support for acute respiratory failure with inability to protect airway s/p witnessed seizure in ICU and complicated by multiple metabolic derangements.  Pt with hx of alcohol abuse and medical noncompliance. Pt being resuscitated with IVF, boluses ordered. Feeding tube not yet placed.  MV: 13.2 Temp: 36.4 C Last BM: 08/07/2011 No feeding tube.   Estimated Nutritional Needs:   Kcal:  1767 Protein:  85-110 grams Fluid:   >2 L/day  NUTRITION DIAGNOSIS: -Inadequate oral intake (NI-2.1).  Status: Ongoing  RELATED TO: inability to eat  AS EVIDENCE BY: NPO status  MONITORING/EVALUATION(Goals): Goal: Pt to meet >/= 90% of their estimated nutrition needs. Monitor: vent status, TF tolerance, labs  EDUCATION NEEDS: -No education needs identified at this time   DOCUMENTATION CODES Per approved criteria  -Not Applicable    Kendell Bane RD, LDN, CNSC [phone] Pager [phone] After Hours Pager  08/07/2011, 10:39 AM

## 2011-08-07 NOTE — Consult Note (Signed)
Reason for Consult:altered mental status Referring Physician: Dr. Aletha Halim is an 56 y.o. male.  HPI:  [name] is a 56 year old gentleman with a history of hypertension, chronic renal insufficiency, and history of meningioma status post resection. The patient also has a history of alcohol abuse. The patient has had a left focal seizure, and he has been treated with Keppra and Dilantin. The patient had a seizure event this morning around 5:30 AM, but the patient has been poorly responsive since that time. The patient currently is intubated. The patient is not following commands. A CT scan of the head has not been done since this admission. Neurology is consult for further evaluation of the altered mental status. An EEG study has been ordered, but this has not yet been done. The patient was given Ativan the seizure this morning. The patient is on 200 mg twice daily of Dilantin, and 500 mg twice daily of Keppra. The creatinine is the 4.5 range.   Past Medical History  Diagnosis Date  . HTN (hypertension), malignant   . CKD (chronic kidney disease), stage III   . Renal artery stenosis     moderate  . Meningioma     s/p resection  . Seizure disorder   . Alcohol abuse     hx  . TIA (transient ischemic attack)   . Diastolic CHF, acute     acute?; echo 7/10 with EF 65%, moderate LVH  . HCV (hepatitis C virus)     Past Surgical History  Procedure Date  . Brain surgery     meningioma resection    No family history on file.  Social History:  does not have a smoking history on file. He does not have any smokeless tobacco history on file. His alcohol and drug histories not on file.  Allergies: Allergies not on file  Medications:  Scheduled:   . antiseptic oral rinse  15 mL Mouth Rinse QID  . calcium gluconate  1 g Intravenous Once  . chlorhexidine  15 mL Mouth/Throat BID  . escitalopram  20 mg Oral Daily  . etomidate      . feeding supplement  30 mL Per Tube Daily    . folic acid  1 mg Oral Daily  . levetiracetam  500 mg Intravenous Q12H  . LORazepam      . pantoprazole (PROTONIX) IV  40 mg Intravenous Q24H  . phenytoin (DILANTIN) IV  1,250 mg Intravenous Once  . phenytoin (DILANTIN) IV  200 mg Intravenous Q12H  . potassium chloride  10 mEq Intravenous Q1 Hr x 4  . potassium chloride  10 mEq Intravenous Q1 Hr x 4  . potassium chloride  10 mEq Intravenous Q1 Hr x 4  . potassium chloride  40 mEq Per Tube Once  . potassium chloride      . rocuronium  0.45 mg/kg (Order-Specific) Intravenous Once  . sodium bicarbonate      . sodium chloride  1,000 mL Intravenous Once  . sodium chloride  500 mL Intravenous Once  . sodium chloride  500 mL Intravenous Once  . thiamine  100 mg Oral Daily  . DISCONTD: amLODipine  5 mg Oral Daily  . DISCONTD: aspirin EC  81 mg Oral Daily  . DISCONTD: carvedilol  25 mg Oral BID WC  . DISCONTD: isosorbide-hydrALAZINE  2 tablet Oral TID  . DISCONTD: lisinopril  20 mg Oral Daily  . DISCONTD: minoxidil  2.5 mg Oral BID  . DISCONTD: pantoprazole (PROTONIX)  IV  40 mg Intravenous Q24H  . DISCONTD: phenytoin  200 mg Oral BID  . DISCONTD: spironolactone  25 mg Oral Daily   Continuous:   . feeding supplement (OSMOLITE 1.2 CAL) 1,000 mL (08/07/11 1750)  . norepinephrine (LEVOPHED) Adult infusion    . norepinephrine (LEVOPHED) Adult infusion    . norepinephrine (LEVOPHED) Adult infusion    .  sodium bicarbonate infusion 1000 mL 100 mL/hr at 08/07/11 1700  . DISCONTD: sodium chloride 100 mL/hr (08/07/11 1610)   RUE:AVWUJW chloride, DISCONTD: LORazepam  Results for orders placed during the hospital encounter of 08/07/11 (from the past 48 hour(s))  GLUCOSE, CAPILLARY     Status: Normal   Collection Time   08/07/11  4:24 AM      Component Value Range Comment   Glucose-Capillary 88  70 - 99 mg/dL   MRSA PCR SCREENING     Status: Normal   Collection Time   08/07/11  4:28 AM      Component Value Range Comment   MRSA by PCR  NEGATIVE  NEGATIVE   BLOOD GAS, ARTERIAL     Status: Abnormal   Collection Time   08/07/11  4:55 AM      Component Value Range Comment   O2 Content 2.0      pH, Arterial 7.382  7.350 - 7.450    pCO2 arterial 13.2 (*) 35.0 - 45.0 mmHg    pO2, Arterial 160.0 (*) 80.0 - 100.0 mmHg    Bicarbonate 7.6 (*) 20.0 - 24.0 mEq/L    TCO2 8.0  0 - 100 mmol/L    Acid-base deficit 17.0 (*) 0.0 - 2.0 mmol/L    O2 Saturation 99.6      Patient temperature 98.6      Allens test (pass/fail) PASS  PASS   URINALYSIS, ROUTINE W REFLEX MICROSCOPIC     Status: Abnormal   Collection Time   08/07/11  5:17 AM      Component Value Range Comment   Color, Urine YELLOW  YELLOW    APPearance CLOUDY (*) CLEAR    Specific Gravity, Urine 1.013  1.005 - 1.030    pH 5.0  5.0 - 8.0    Glucose, UA NEGATIVE  NEGATIVE mg/dL    Hgb urine dipstick MODERATE (*) NEGATIVE    Bilirubin Urine NEGATIVE  NEGATIVE    Ketones, ur 15 (*) NEGATIVE mg/dL    Protein, ur NEGATIVE  NEGATIVE mg/dL    Urobilinogen, UA 0.2  0.0 - 1.0 mg/dL    Nitrite NEGATIVE  NEGATIVE    Leukocytes, UA NEGATIVE  NEGATIVE   URINE MICROSCOPIC-ADD ON     Status: Normal   Collection Time   08/07/11  5:17 AM      Component Value Range Comment   Squamous Epithelial / LPF RARE  RARE    WBC, UA 0-2  <3 WBC/hpf    RBC / HPF 3-6  <3 RBC/hpf    Bacteria, UA RARE  RARE    Urine-Other MUCOUS PRESENT     CBC WITH DIFFERENTIAL     Status: Abnormal   Collection Time   08/07/11  5:30 AM      Component Value Range Comment   WBC 9.6  4.0 - 10.5 K/uL    RBC 2.17 (*) 4.22 - 5.81 MIL/uL    Hemoglobin 7.5 (*) 13.0 - 17.0 g/dL    HCT 11.9 (*) 14.7 - 52.0 %    MCV 95.4  78.0 - 100.0 fL  MCH 34.6 (*) 26.0 - 34.0 pg    MCHC 36.2 (*) 30.0 - 36.0 g/dL    RDW 40.9  81.1 - 91.4 %    Platelets 60 (*) 150 - 400 K/uL PLATELET COUNT CONFIRMED BY SMEAR   Neutrophils Relative 80 (*) 43 - 77 %    Lymphocytes Relative 10 (*) 12 - 46 %    Monocytes Relative 10  3 - 12 %     Eosinophils Relative 0  0 - 5 %    Basophils Relative 0  0 - 1 %    Neutro Abs 7.6  1.7 - 7.7 K/uL    Lymphs Abs 1.0  0.7 - 4.0 K/uL    Monocytes Absolute 1.0  0.1 - 1.0 K/uL    Eosinophils Absolute 0.0  0.0 - 0.7 K/uL    Basophils Absolute 0.0  0.0 - 0.1 K/uL    RBC Morphology ACANTHOCYTES     COMPREHENSIVE METABOLIC PANEL     Status: Abnormal   Collection Time   08/07/11  5:30 AM      Component Value Range Comment   Sodium 144  135 - 145 mEq/L    Potassium 2.7 (*) 3.5 - 5.1 mEq/L    Chloride 115 (*) 96 - 112 mEq/L    CO2 9 (*) 19 - 32 mEq/L    Glucose, Bld 106 (*) 70 - 99 mg/dL    BUN 57 (*) 6 - 23 mg/dL    Creatinine, Ser 7.82 (*) 0.50 - 1.35 mg/dL    Calcium 6.9 (*) 8.4 - 10.5 mg/dL    Total Protein 5.0 (*) 6.0 - 8.3 g/dL    Albumin 1.4 (*) 3.5 - 5.2 g/dL    AST 49 (*) 0 - 37 U/L    ALT 33  0 - 53 U/L    Alkaline Phosphatase 91  39 - 117 U/L    Total Bilirubin 0.5  0.3 - 1.2 mg/dL    GFR calc non Af Amer 11 (*) >90 mL/min    GFR calc Af Amer 13 (*) >90 mL/min   APTT     Status: Normal   Collection Time   08/07/11  5:30 AM      Component Value Range Comment   aPTT 37  24 - 37 seconds   PROTIME-INR     Status: Abnormal   Collection Time   08/07/11  5:30 AM      Component Value Range Comment   Prothrombin Time 15.8 (*) 11.6 - 15.2 seconds    INR 1.23  0.00 - 1.49   CARDIAC PANEL(CRET KIN+CKTOT+MB+TROPI)     Status: Abnormal   Collection Time   08/07/11  5:30 AM      Component Value Range Comment   Total CK 1066 (*) 7 - 232 U/L    CK, MB 7.0 (*) 0.3 - 4.0 ng/mL    Troponin I 0.39 (*) <0.30 ng/mL    Relative Index 0.7  0.0 - 2.5   LACTIC ACID, PLASMA     Status: Abnormal   Collection Time   08/07/11  5:30 AM      Component Value Range Comment   Lactic Acid, Venous 5.3 (*) 0.5 - 2.2 mmol/L   AMMONIA     Status: Abnormal   Collection Time   08/07/11  5:30 AM      Component Value Range Comment   Ammonia 126 (*) 11 - 60 umol/L   PREALBUMIN     Status: Abnormal  Collection  Time   08/07/11  5:30 AM      Component Value Range Comment   Prealbumin 7.7 (*) 17.0 - 34.0 mg/dL   OSMOLALITY     Status: Abnormal   Collection Time   08/07/11  5:30 AM      Component Value Range Comment   Osmolality 313 (*) 275 - 300 mOsm/kg   PROCALCITONIN     Status: Normal   Collection Time   08/07/11  5:30 AM      Component Value Range Comment   Procalcitonin 0.99     MAGNESIUM     Status: Normal   Collection Time   08/07/11  5:30 AM      Component Value Range Comment   Magnesium 1.9  1.5 - 2.5 mg/dL   BLOOD GAS, ARTERIAL     Status: Abnormal   Collection Time   08/07/11 10:13 AM      Component Value Range Comment   FIO2 1.00      Delivery systems VENTILATOR      Mode PRESSURE REGULATED VOLUME CONTROL      VT 600      Rate 22      Peep/cpap 5.0      pH, Arterial 7.336 (*) 7.350 - 7.450    pCO2 arterial 16.0 (*) 35.0 - 45.0 mmHg    pO2, Arterial 514.0 (*) 80.0 - 100.0 mmHg    Bicarbonate 8.3 (*) 20.0 - 24.0 mEq/L    TCO2 8.8  0 - 100 mmol/L    Acid-base deficit 16.7 (*) 0.0 - 2.0 mmol/L    O2 Saturation 100.0      Patient temperature 98.6      Collection site A-LINE      Drawn by [id number]      Sample type ARTERIAL DRAW      Allens test (pass/fail) PASS  PASS   STREP PNEUMONIAE URINARY ANTIGEN     Status: Normal   Collection Time   08/07/11 11:13 AM      Component Value Range Comment   Strep Pneumo Urinary Antigen NEGATIVE  NEGATIVE   LACTIC ACID, PLASMA     Status: Normal   Collection Time   08/07/11 11:55 AM      Component Value Range Comment   Lactic Acid, Venous 0.8  0.5 - 2.2 mmol/L   PROCALCITONIN     Status: Normal   Collection Time   08/07/11 11:55 AM      Component Value Range Comment   Procalcitonin 0.66     CORTISOL     Status: Normal   Collection Time   08/07/11 11:55 AM      Component Value Range Comment   Cortisol, Plasma 17.4     BASIC METABOLIC PANEL     Status: Abnormal   Collection Time   08/07/11 11:55 AM      Component Value Range Comment   Sodium  143  135 - 145 mEq/L    Potassium <2.0 (*) 3.5 - 5.1 mEq/L    Chloride 120 (*) 96 - 112 mEq/L    CO2 8 (*) 19 - 32 mEq/L    Glucose, Bld 120 (*) 70 - 99 mg/dL    BUN 46 (*) 6 - 23 mg/dL    Creatinine, Ser 0.45 (*) 0.50 - 1.35 mg/dL    Calcium 5.3 (*) 8.4 - 10.5 mg/dL    GFR calc non Af Amer 15 (*) >90 mL/min    GFR calc Af Amer 17 (*) >  90 mL/min   TYPE AND SCREEN     Status: Normal (Preliminary result)   Collection Time   08/07/11 11:55 AM      Component Value Range Comment   ABO/RH(D) A POS      Antibody Screen NEG      Sample Expiration 08/10/2011      Unit Number [id number]      Blood Component Type RED CELLS,LR      Unit division 00      Status of Unit ISSUED      Transfusion Status OK TO TRANSFUSE      Crossmatch Result Compatible      Unit Number [id number]      Blood Component Type RED CELLS,LR      Unit division 00      Status of Unit ALLOCATED      Transfusion Status OK TO TRANSFUSE      Crossmatch Result Compatible      Unit Number [id number]      Blood Component Type RED CELLS,LR      Unit division 00      Status of Unit ALLOCATED      Transfusion Status OK TO TRANSFUSE      Crossmatch Result Compatible     PREPARE RBC (CROSSMATCH)     Status: Normal   Collection Time   08/07/11 11:55 AM      Component Value Range Comment   Order Confirmation ORDER PROCESSED BY BLOOD BANK     BASIC METABOLIC PANEL     Status: Abnormal   Collection Time   08/07/11  5:14 PM      Component Value Range Comment   Sodium 143  135 - 145 mEq/L    Potassium 2.7 (*) 3.5 - 5.1 mEq/L    Chloride 119 (*) 96 - 112 mEq/L    CO2 9 (*) 19 - 32 mEq/L    Glucose, Bld 122 (*) 70 - 99 mg/dL    BUN 45 (*) 6 - 23 mg/dL    Creatinine, Ser 0.86 (*) 0.50 - 1.35 mg/dL    Calcium 5.5 (*) 8.4 - 10.5 mg/dL    GFR calc non Af Amer 15 (*) >90 mL/min    GFR calc Af Amer 17 (*) >90 mL/min   PHOSPHORUS     Status: Abnormal   Collection Time   08/07/11  5:14 PM      Component Value Range Comment   Phosphorus  1.9 (*) 2.3 - 4.6 mg/dL   MAGNESIUM     Status: Normal   Collection Time   08/07/11  5:14 PM      Component Value Range Comment   Magnesium 1.5  1.5 - 2.5 mg/dL   HEMOGLOBIN AND HEMATOCRIT, BLOOD     Status: Abnormal   Collection Time   08/07/11  6:55 PM      Component Value Range Comment   Hemoglobin 7.8 (*) 13.0 - 17.0 g/dL    HCT 57.8 (*) 46.9 - 52.0 %     US Renal Port  08/07/2011  *RADIOLOGY REPORT*  Clinical Data: History of elevation of BUN and creatinine level. History of chronic renal disease.  History of hypertension. There is history of renal arterial stenoses.  RENAL/URINARY TRACT ULTRASOUND COMPLETE  Comparison:  CT 08/30/2006 study.  Findings:  Right Kidney:  Right renal length is 10.5 cm.  There is increased echogenicity of the renal parenchyma.  No hydronephrosis is evident.  No renal cyst or mass is evident.  No  calculus or focal parenchymal loss is seen.  Left Kidney:  Left renal length as 11.1 cm.  There is increased echogenicity of renal parenchyma.  No hydronephrosis is evident. Small renal cyst in the lower pole measures 1.0 x 0.8 x 0.6 cm. The cyst appears simple.  No solid mass, calculus, or focal parenchymal loss is seen.  Bladder:  Urinary bladder is decompressed by Foley catheter.  Incidental note is made of ascites.  IMPRESSION: Increased echogenicity of renal parenchyma consistent with chronic medical renal disease.  No hydronephrosis evident.  Small simple appearing left renal cyst.  Ascites.  Original Report Authenticated By: Crawford Givens, M.D.   Dg Chest Port 1 View  08/07/2011  *RADIOLOGY REPORT*  Clinical Data: Intubated.  Central line placement.  PORTABLE CHEST - 1 VIEW  Comparison: 08/28/2006.  Findings: Stable right jugular catheter with its tip in the superior vena cava.  Interval endotracheal tube with its tip in satisfactory position 4 cm above the carina.  Interval minimal linear density at the left lung base.  Otherwise, clear lungs. Normal sized heart.   Unremarkable bones.  IMPRESSION:  1.  Interval minimal linear atelectasis at the left lung base. 2.  A double density is again demonstrated at the aortic arch. Again, follow-up PA and lateral views would be helpful when possible.  Original Report Authenticated By: Darrol Angel, M.D.   Dg Abd Portable 1v  08/07/2011  *RADIOLOGY REPORT*  Clinical Data: Panda placement  PORTABLE ABDOMEN - 1 VIEW  Comparison: None.  Findings: Panda tube is coiled in the stomach near the fundus. Nonspecific gas pattern.  IMPRESSION: As above.  Original Report Authenticated By: Elsie Stain, M.D.   Dg Abd Portable 1v  08/07/2011  *RADIOLOGY REPORT*  Clinical Data: Feeding tube placement.  PORTABLE ABDOMEN - 1 VIEW 08/07/2011 1455 hours:  Comparison: CTA abdomen 08/30/2006.  Findings: Feeding tube looped in the stomach, coursing back up into the esophagus, with its tip in the distal esophagus.  Bowel gas pattern unremarkable.  Iliofemoral atherosclerosis.  IMPRESSION:  1.  Feeding tube looped in the stomach, coursing back up into the esophagus, with its tip in the distal esophagus.  This will need to be withdrawn several centimeters and re-advanced. 2.  No acute abdominal abnormality.  These results will be called to the ordering clinician or representative by the Radiologist Assistant, and communication documented in the PACS Dashboard.  Original Report Authenticated By: Arnell Sieving, M.D.    @ROS @  Review of systems cannot be obtained, the patient is intubated and stuporous.   Blood pressure 78/60, pulse 69, temperature 96 F (35.6 C), temperature source Axillary, resp. rate 21, height 5\' 11"  (1.803 m), weight 69.3 kg (152 lb 12.5 oz), SpO2 100.00%.  Physical Examination:  The patient is intubated, stuporous.  Respiratory examination is relatively clear.  Cardiovascular examination reveals a regular rate and rhythm, no obvious murmurs are noted.  1+ edema of ankles is noted bilaterally.  With sternal  rub, the patient will open his eyes, he will not follow commands. The patient partially localizes the pain with the right arm, and does not move the left side. The patient will withdrawal to deep pain stimulation with both feet.  The patient has slight nystagmus involving the eyes. Pupils are round and reactive to light. At times, the patient may blink to threat.  Deep tendon reflexes are depressed but symmetric. Toes are neutral bilaterally.  The patient would not follow commands for cerebellar testing. The patient could  not be ambulated  Assessment/Plan:  1. History of meningioma  2. Left focal seizure, persistent altered mental status  3. Chronic renal insufficiency  The patient has had a seizure event this morning, and he has had persistent altered mental status since that time. This is a high-risk scenario for subclinical status epilepticus. I recommended the patient be placed on a general anesthetic. I discussed the case with care medicine, who has recommended versed. The patient may require pressor medications, as he has been somewhat hypotensive and hypothermic throughout the day. I will order a CT scan of the head this evening. We will follow the patient while in house. The patient will have an EEG study in the morning, but the patient likely will need a prolonged EEG evaluation throughout the day tomorrow. Depending upon the EEG results, the Keppra could be discontinued, particularly as the patient does have renal insufficiency.    Mima Cranmore KEITH 08/07/2011, 7:57 PM

## 2011-08-07 NOTE — Consult Note (Signed)
Reason for Consult: And subdural hematoma Referring Physician: Internal medicine  [name] is an 56 y.o. male.  HPI: 56 year old gentleman with a history of meningioma resection per Dr. Ezzard Standing many years ago patient presents the outside ER with altered mental status is well as significant multiple medical abnormalities including chronic hypertension chronic renal failure history of alcohol abuse history TIA. Patient transferred to the medical service on at [facility] patient was witnessed to have a left focal seizure patient was given Ativan and loaded Dilantin Keppra  Past Medical History  Diagnosis Date  . HTN (hypertension), malignant   . CKD (chronic kidney disease), stage III   . Renal artery stenosis     moderate  . Meningioma     s/p resection  . Seizure disorder   . Alcohol abuse     hx  . TIA (transient ischemic attack)   . Diastolic CHF, acute     acute?; echo 7/10 with EF 65%, moderate LVH  . HCV (hepatitis C virus)     Past Surgical History  Procedure Date  . Brain surgery     meningioma resection    No family history on file.  Social History:  does not have a smoking history on file. He does not have any smokeless tobacco history on file. His alcohol and drug histories not on file.  Allergies: Allergies not on file  Medications: I have reviewed the patient's current medications.  Results for orders placed during the hospital encounter of 08/07/11 (from the past 48 hour(s))  GLUCOSE, CAPILLARY     Status: Normal   Collection Time   08/07/11  4:24 AM      Component Value Range Comment   Glucose-Capillary 88  70 - 99 mg/dL   MRSA PCR SCREENING     Status: Normal   Collection Time   08/07/11  4:28 AM      Component Value Range Comment   MRSA by PCR NEGATIVE  NEGATIVE   BLOOD GAS, ARTERIAL     Status: Abnormal   Collection Time   08/07/11  4:55 AM      Component Value Range Comment   O2 Content 2.0      pH, Arterial 7.382  7.350 - 7.450      pCO2 arterial 13.2 (*) 35.0 - 45.0 mmHg    pO2, Arterial 160.0 (*) 80.0 - 100.0 mmHg    Bicarbonate 7.6 (*) 20.0 - 24.0 mEq/L    TCO2 8.0  0 - 100 mmol/L    Acid-base deficit 17.0 (*) 0.0 - 2.0 mmol/L    O2 Saturation 99.6      Patient temperature 98.6      Allens test (pass/fail) PASS  PASS   URINALYSIS, ROUTINE W REFLEX MICROSCOPIC     Status: Abnormal   Collection Time   08/07/11  5:17 AM      Component Value Range Comment   Color, Urine YELLOW  YELLOW    APPearance CLOUDY (*) CLEAR    Specific Gravity, Urine 1.013  1.005 - 1.030    pH 5.0  5.0 - 8.0    Glucose, UA NEGATIVE  NEGATIVE mg/dL    Hgb urine dipstick MODERATE (*) NEGATIVE    Bilirubin Urine NEGATIVE  NEGATIVE    Ketones, ur 15 (*) NEGATIVE mg/dL    Protein, ur NEGATIVE  NEGATIVE mg/dL    Urobilinogen, UA 0.2  0.0 - 1.0 mg/dL    Nitrite NEGATIVE  NEGATIVE    Leukocytes,  UA NEGATIVE  NEGATIVE   URINE MICROSCOPIC-ADD ON     Status: Normal   Collection Time   08/07/11  5:17 AM      Component Value Range Comment   Squamous Epithelial / LPF RARE  RARE    WBC, UA 0-2  <3 WBC/hpf    RBC / HPF 3-6  <3 RBC/hpf    Bacteria, UA RARE  RARE    Urine-Other MUCOUS PRESENT     CBC WITH DIFFERENTIAL     Status: Abnormal   Collection Time   08/07/11  5:30 AM      Component Value Range Comment   WBC 9.6  4.0 - 10.5 K/uL    RBC 2.17 (*) 4.22 - 5.81 MIL/uL    Hemoglobin 7.5 (*) 13.0 - 17.0 g/dL    HCT 40.9 (*) 81.1 - 52.0 %    MCV 95.4  78.0 - 100.0 fL    MCH 34.6 (*) 26.0 - 34.0 pg    MCHC 36.2 (*) 30.0 - 36.0 g/dL    RDW 91.4  78.2 - 95.6 %    Platelets 60 (*) 150 - 400 K/uL PLATELET COUNT CONFIRMED BY SMEAR   Neutrophils Relative 80 (*) 43 - 77 %    Lymphocytes Relative 10 (*) 12 - 46 %    Monocytes Relative 10  3 - 12 %    Eosinophils Relative 0  0 - 5 %    Basophils Relative 0  0 - 1 %    Neutro Abs 7.6  1.7 - 7.7 K/uL    Lymphs Abs 1.0  0.7 - 4.0 K/uL    Monocytes Absolute 1.0  0.1 - 1.0 K/uL    Eosinophils Absolute  0.0  0.0 - 0.7 K/uL    Basophils Absolute 0.0  0.0 - 0.1 K/uL    RBC Morphology ACANTHOCYTES     APTT     Status: Normal   Collection Time   08/07/11  5:30 AM      Component Value Range Comment   aPTT 37  24 - 37 seconds   PROTIME-INR     Status: Abnormal   Collection Time   08/07/11  5:30 AM      Component Value Range Comment   Prothrombin Time 15.8 (*) 11.6 - 15.2 seconds    INR 1.23  0.00 - 1.49   LACTIC ACID, PLASMA     Status: Abnormal   Collection Time   08/07/11  5:30 AM      Component Value Range Comment   Lactic Acid, Venous 5.3 (*) 0.5 - 2.2 mmol/L   AMMONIA     Status: Abnormal   Collection Time   08/07/11  5:30 AM      Component Value Range Comment   Ammonia 126 (*) 11 - 60 umol/L     No results found.  @ROS @ Blood pressure 94/67, pulse 76, temperature 97.5 F (36.4 C), resp. rate 16, height 5\' 11"  (1.803 m), weight 69.3 kg (152 lb 12.5 oz), SpO2 96.00%. Patient sedated after the Ativan pupils are equal does withdraw and tracked with not to stimulation but will not follow commands he is post ictal  Assessment/Plan: [age] with a history of meningioma resection per Dr. Ezzard Standing presents with altered L. status and a scan shows a small right-sided subdural hematoma I do not think this subdural hematoma is significant to warrant surgical intervention he does appear to be chronic with maybe a slight layer of either meningeal thickening or mild acute  blood within it very minimal mass effect from this is necessarily related to his clinical presentation however I do recommend continued medical workup as well seizure medicine management. I would recommend a neurology consult an MRI scan without contrast of his brain. He has a known seizure disorder so not convinced that this is a small hematoma is causing seizures as well.  Duval Macleod P 08/07/2011, 6:54 AM

## 2011-08-07 NOTE — Progress Notes (Signed)
Critical lab values (K+, CO2, Ca++) reviewed w/ Dr. Vassie Loll, orders received.

## 2011-08-07 NOTE — Procedures (Signed)
Arterial Catheter Insertion Procedure Note LASARO PRIMM [id number] May 08, 1955  Procedure: Insertion of Arterial Catheter  Indications: Blood pressure monitoring  Procedure Details Consent: Unable to obtain consent because of altered level of consciousness. Time Out: Verified patient identification, verified procedure, site/side was marked, verified correct patient position, special equipment/implants available, medications/allergies/relevent history reviewed, required imaging and test results available.  Performed  Maximum sterile technique was used including antiseptics, cap, gloves, gown, hand hygiene, mask and sheet. Skin prep: Chlorhexidine; local anesthetic administered 20 gauge catheter was inserted into left radial artery using the Seldinger technique.  Evaluation Blood flow good; BP tracing good. Complications: No apparent complications.   [name] 08/07/2011

## 2011-08-07 NOTE — Progress Notes (Signed)
Pt w/ BP 63/36; minimally responsive since lorazepam dose at 05:40.  Dr. Molli Knock notified, orders received.

## 2011-08-07 NOTE — Procedures (Signed)
Intubation Procedure Note [name] [id number] [birth year]  Procedure: Intubation Indications: Respiratory insufficiency  Procedure Details Consent: Risks of procedure as well as the alternatives and risks of each were explained to the (patient/caregiver).  Consent for procedure obtained. and Unable to obtain consent because of emergent medical necessity. Time Out: Verified patient identification, verified procedure, site/side was marked, verified correct patient position, special equipment/implants available, medications/allergies/relevent history reviewed, required imaging and test results available.  Performed Premedicated w/ etomidate and rocuronium .Marland KitchenMarland KitchenOrdered under direct supervision and instruction of Dr Molli Knock for intubation.   Maximum sterile technique was used including antiseptics, cap, gloves, gown, hand hygiene, mask and sheet.  MAC and 4    Evaluation Hemodynamic Status: Persistent hypotension treated with pressors; O2 sats: stable throughout Patient's Current Condition: unstable Complications: No apparent complications Patient did tolerate procedure well. Chest X-ray ordered to verify placement.  CXR: pending.   [name] 08/07/2011   Patient seen and examined, agree with above note.  I dictated the care and orders written for this patient under my direction.  [name], M.D. [phone]

## 2011-08-07 NOTE — Progress Notes (Signed)
eLink Physician-Brief Progress Note Patient Name: [name] DOB: 06-25-1955 MRN: [id number]  Date of Service  08/07/2011   HPI/Events of Note     eICU Interventions  D/w Dr Anne Hahn - will induce versed coma for non convulsive status, since high pre test prob, while waiting on eeg. Levo gtt for soft BP, CVP 12 chk Urine lytes    Intervention Category Major Interventions: Seizures - evaluation and management  Adarryl Goldammer V. 08/07/2011, 8:20 PM

## 2011-08-07 NOTE — Progress Notes (Signed)
Pt w/ Hgb 7.5 & order to transfuse PRBC.  Pt unresponsive & unable to give consent.  Attempted to contact pt's aunt Zettie Pho (medical decision-maker) w/ no response.  Reviewed w/ Dr. Tyson Alias who asked that we proceed with transfusion on emergency basis.

## 2011-08-07 NOTE — Progress Notes (Signed)
  Echocardiogram 2D Echocardiogram has been performed.  [name] 08/07/2011, 10:51 AM

## 2011-08-08 ENCOUNTER — Inpatient Hospital Stay (HOSPITAL_COMMUNITY): Payer: Medicare Other

## 2011-08-08 DIAGNOSIS — D649 Anemia, unspecified: Secondary | ICD-10-CM

## 2011-08-08 DIAGNOSIS — R569 Unspecified convulsions: Secondary | ICD-10-CM

## 2011-08-08 DIAGNOSIS — E876 Hypokalemia: Secondary | ICD-10-CM

## 2011-08-08 DIAGNOSIS — B1920 Unspecified viral hepatitis C without hepatic coma: Secondary | ICD-10-CM

## 2011-08-08 LAB — LEGIONELLA ANTIGEN, URINE: Legionella Antigen, Urine: NEGATIVE

## 2011-08-08 LAB — BLOOD GAS, ARTERIAL
MECHVT: 600 mL
TCO2: 11.3 mmol/L (ref 0–100)
pCO2 arterial: 18.6 mmHg — CL (ref 35.0–45.0)
pH, Arterial: 7.379 (ref 7.350–7.450)

## 2011-08-08 LAB — BASIC METABOLIC PANEL
BUN: 43 mg/dL — ABNORMAL HIGH (ref 6–23)
BUN: 42 mg/dL — ABNORMAL HIGH (ref 6–23)
BUN: 43 mg/dL — ABNORMAL HIGH (ref 6–23)
Calcium: 5.8 mg/dL — CL (ref 8.4–10.5)
Calcium: 5.7 mg/dL — CL (ref 8.4–10.5)
Calcium: 5.9 mg/dL — CL (ref 8.4–10.5)
Calcium: 5.6 mg/dL — CL (ref 8.4–10.5)
Creatinine, Ser: 3.88 mg/dL — ABNORMAL HIGH (ref 0.50–1.35)
Creatinine, Ser: 3.88 mg/dL — ABNORMAL HIGH (ref 0.50–1.35)
Creatinine, Ser: 3.87 mg/dL — ABNORMAL HIGH (ref 0.50–1.35)
Creatinine, Ser: 3.81 mg/dL — ABNORMAL HIGH (ref 0.50–1.35)
GFR calc Af Amer: 19 mL/min — ABNORMAL LOW (ref >90)
GFR calc Af Amer: 19 mL/min — ABNORMAL LOW (ref >90)
GFR calc Af Amer: 19 mL/min — ABNORMAL LOW (ref >90)
GFR calc non Af Amer: 16 mL/min — ABNORMAL LOW (ref >90)
GFR calc non Af Amer: 16 mL/min — ABNORMAL LOW (ref >90)
GFR calc non Af Amer: 16 mL/min — ABNORMAL LOW (ref >90)
GFR calc non Af Amer: 16 mL/min — ABNORMAL LOW (ref >90)
Potassium: 2.7 mEq/L — CL (ref 3.5–5.1)
Potassium: 2.7 mEq/L — CL (ref 3.5–5.1)
Sodium: 143 mEq/L (ref 135–145)

## 2011-08-08 LAB — ALBUMIN: Albumin: 1.1 g/dL — ABNORMAL LOW (ref 3.5–5.2)

## 2011-08-08 LAB — CALCIUM, IONIZED: Calcium, Ion: 0.93 mmol/L — ABNORMAL LOW (ref 1.12–1.32)

## 2011-08-08 LAB — COMPREHENSIVE METABOLIC PANEL
AST: 42 U/L — ABNORMAL HIGH (ref 0–37)
BUN: 43 mg/dL — ABNORMAL HIGH (ref 6–23)
CO2: 10 mEq/L — CL (ref 19–32)
Chloride: 121 mEq/L — ABNORMAL HIGH (ref 96–112)
Creatinine, Ser: 3.95 mg/dL — ABNORMAL HIGH (ref 0.50–1.35)
GFR calc non Af Amer: 16 mL/min — ABNORMAL LOW (ref >90)
Total Bilirubin: 0.7 mg/dL (ref 0.3–1.2)

## 2011-08-08 LAB — SODIUM, URINE, RANDOM: Sodium, Ur: 13 mEq/L

## 2011-08-08 LAB — MAGNESIUM: Magnesium: 1.4 mg/dL — ABNORMAL LOW (ref 1.5–2.5)

## 2011-08-08 LAB — GLUCOSE, CAPILLARY

## 2011-08-08 LAB — PRO B NATRIURETIC PEPTIDE: Pro B Natriuretic peptide (BNP): 3123 pg/mL — ABNORMAL HIGH (ref 0–125)

## 2011-08-08 LAB — CBC
HCT: 22.1 % — ABNORMAL LOW (ref 39.0–52.0)
MCV: 90.9 fL (ref 78.0–100.0)
RBC: 2.43 MIL/uL — ABNORMAL LOW (ref 4.22–5.81)
WBC: 16.3 10*3/uL — ABNORMAL HIGH (ref 4.0–10.5)

## 2011-08-08 LAB — PROCALCITONIN: Procalcitonin: 0.66 ng/mL

## 2011-08-08 LAB — PROTIME-INR: Prothrombin Time: 16.7 seconds — ABNORMAL HIGH (ref 11.6–15.2)

## 2011-08-08 LAB — PHOSPHORUS: Phosphorus: 1.9 mg/dL — ABNORMAL LOW (ref 2.3–4.6)

## 2011-08-08 LAB — LACTIC ACID, PLASMA: Lactic Acid, Venous: 2.3 mmol/L — ABNORMAL HIGH (ref 0.5–2.2)

## 2011-08-08 MED ORDER — VANCOMYCIN HCL IN DEXTROSE 1-5 GM/200ML-% IV SOLN: 1000.0000 mg | INTRAVENOUS | Status: DC

## 2011-08-08 MED ORDER — SODIUM CHLORIDE 0.9 % FOR CRRT
INTRAVENOUS_CENTRAL | Status: DC | PRN
  Filled 2011-08-08: qty 1000

## 2011-08-08 MED ORDER — HEPARIN SODIUM (PORCINE) 5000 UNIT/ML IJ SOLN
250.0000 [IU]/h | INTRAMUSCULAR | Status: DC
  Administered 2011-08-08: 250 [IU]/h via INTRAVENOUS_CENTRAL
  Filled 2011-08-08 (×2): qty 2

## 2011-08-08 MED ORDER — PIPERACILLIN-TAZOBACTAM 3.375 G IVPB
3.3750 g | INTRAVENOUS | Status: DC
  Filled 2011-08-08: qty 50

## 2011-08-08 MED ORDER — PRISMASOL BGK 4/2.5 32-4-2.5 MEQ/L IV SOLN
INTRAVENOUS | Status: DC
  Administered 2011-08-08 – 2011-08-10 (×5): via INTRAVENOUS_CENTRAL
  Filled 2011-08-08 (×9): qty 5000

## 2011-08-08 MED ORDER — HEPARIN BOLUS VIA INFUSION (CRRT)
1000.0000 [IU] | INTRAVENOUS | Status: DC | PRN
  Filled 2011-08-08: qty 1000

## 2011-08-08 MED ORDER — PIPERACILLIN-TAZOBACTAM IN DEX 2-0.25 GM/50ML IV SOLN
2.2500 g | INTRAVENOUS | Status: AC
  Administered 2011-08-08: 2.25 g via INTRAVENOUS
  Filled 2011-08-08: qty 50

## 2011-08-08 MED ORDER — HYDROCORTISONE SOD SUCCINATE 100 MG IJ SOLR
50.0000 mg | Freq: Four times a day (QID) | INTRAMUSCULAR | Status: DC
  Administered 2011-08-08 – 2011-08-10 (×8): 50 mg via INTRAVENOUS
  Filled 2011-08-08 (×12): qty 1

## 2011-08-08 MED ORDER — VANCOMYCIN HCL 1000 MG IV SOLR
1500.0000 mg | INTRAVENOUS | Status: AC
  Administered 2011-08-08: 1500 mg via INTRAVENOUS
  Filled 2011-08-08: qty 1500

## 2011-08-08 MED ORDER — POTASSIUM CHLORIDE 10 MEQ/50ML IV SOLN
10.0000 meq | INTRAVENOUS | Status: AC
  Administered 2011-08-08 (×6): 10 meq via INTRAVENOUS
  Filled 2011-08-08: qty 300

## 2011-08-08 MED ORDER — VANCOMYCIN HCL 1000 MG IV SOLR: 750.0000 mg | INTRAVENOUS | Status: DC

## 2011-08-08 MED ORDER — PIPERACILLIN-TAZOBACTAM IN DEX 2-0.25 GM/50ML IV SOLN
2.2500 g | Freq: Three times a day (TID) | INTRAVENOUS | Status: DC
  Administered 2011-08-08 – 2011-08-09 (×2): 2.25 g via INTRAVENOUS
  Filled 2011-08-08 (×5): qty 50

## 2011-08-08 MED ORDER — MAGNESIUM SULFATE 40 MG/ML IJ SOLN
2.0000 g | Freq: Once | INTRAMUSCULAR | Status: AC
  Administered 2011-08-08: 2 g via INTRAVENOUS
  Filled 2011-08-08 (×2): qty 50

## 2011-08-08 MED ORDER — PRISMASOL BGK 4/2.5 32-4-2.5 MEQ/L IV SOLN
INTRAVENOUS | Status: DC
  Administered 2011-08-08 – 2011-08-10 (×15): via INTRAVENOUS_CENTRAL
  Filled 2011-08-08 (×25): qty 5000

## 2011-08-08 MED ORDER — HEPARIN SODIUM (PORCINE) 1000 UNIT/ML DIALYSIS
1000.0000 [IU] | INTRAMUSCULAR | Status: DC | PRN
  Filled 2011-08-08: qty 6

## 2011-08-08 MED ORDER — PRISMASOL BGK 4/2.5 32-4-2.5 MEQ/L IV SOLN
INTRAVENOUS | Status: DC
  Administered 2011-08-08 – 2011-08-10 (×3): via INTRAVENOUS_CENTRAL
  Filled 2011-08-08 (×4): qty 5000

## 2011-08-08 MED ORDER — SODIUM PHOSPHATE 3 MMOLE/ML IV SOLN
30.0000 mmol | Freq: Once | INTRAVENOUS | Status: AC
  Administered 2011-08-08: 30 mmol via INTRAVENOUS
  Filled 2011-08-08: qty 10

## 2011-08-08 MED ORDER — PIPERACILLIN-TAZOBACTAM 3.375 G IVPB
3.3750 g | Freq: Three times a day (TID) | INTRAVENOUS | Status: DC
  Filled 2011-08-08 (×2): qty 50

## 2011-08-08 MED ORDER — VANCOMYCIN HCL 1000 MG IV SOLR
1250.0000 mg | INTRAVENOUS | Status: DC
  Filled 2011-08-08: qty 1250

## 2011-08-08 MED ORDER — FENTANYL CITRATE 0.05 MG/ML IJ SOLN
INTRAMUSCULAR | Status: AC
  Filled 2011-08-08: qty 2

## 2011-08-08 NOTE — Procedures (Signed)
Trialysis Central Venous Catheter Insertion Procedure Note [name] [id number] May 08, 1955  Procedure: Insertion of Central Venous Catheter Indications: Dialysis Need  Procedure Details Consent: Unable to obtain consent because of altered level of consciousness. Time Out: Verified patient identification, verified procedure, site/side was marked, verified correct patient position, special equipment/implants available, medications/allergies/relevent history reviewed, required imaging and test results available.  Performed  Maximum sterile technique was used including antiseptics, cap, gloves, gown, hand hygiene, mask and sheet. Skin prep: Chlorhexidine; local anesthetic administered A antimicrobial bonded/coated triple lumen catheter was placed in the right femoral vein due to patient being a dialysis patient using the Seldinger technique.  Evaluation Blood flow good Complications: No apparent complications Patient did tolerate procedure well. Chest X-ray ordered to verify placement.  CXR: pending.  U/S used in placement.  [name],[name] 08/08/2011, 4:32 PM

## 2011-08-08 NOTE — Progress Notes (Signed)
Patient remains hypotensive.  CVP 13, hyperdynamic heart, cortisol low and replaced, pH 7.37, volume status adequate clinically and no fever but started on abx and cultured anyway.  It is unclear to me why the patient is so profoundly hypotensive.  Spoke with renal, patient needs CVVH to address acidosis and electrolytes.  Concern also for intra-hepatic shunt.  Will order dopplers for that.  Overall prognosis is very poor.  I have not seen any family to address code status...etc.  I am concerned for his chances of survival.  We have also placed a PAC and we are awaiting numbers to adjust pressors accordingly.  Additional CC time of 60 min.  Alyson Reedy, M.D. [facility] Pulmonary/Critical Care Medicine. Pager: [phone]. After hours pager: [phone].

## 2011-08-08 NOTE — Progress Notes (Signed)
Subjective: Patient reports Patient remains sedated encephalopathic obtunded  Objective: Vital signs in last 24 hours: Temp:  [94.4 F (34.7 C)-97.5 F (36.4 C)] 97.5 F (36.4 C) (07/10 0400) Pulse Rate:  [58-75] 68  (07/10 0700) Resp:  [13-25] 22  (07/10 0700) BP: (74-111)/(44-88) 92/69 mmHg (07/10 0700) SpO2:  [98 %-100 %] 100 % (07/10 0700) Arterial Line BP: (63-112)/(39-71) 95/58 mmHg (07/10 0700) FiO2 (%):  [30 %-100 %] 30 % (07/10 0352) Weight:  [82.1 kg (181 lb)] 82.1 kg (181 lb) (07/10 0615)  Intake/Output from previous day: 07/09 0701 - 07/10 0700 In: 6893.6 [I.V.:5253.1; Blood:362.5; NG/GT:150; IV Piggyback:1128] Out: 440 [Urine:440] Intake/Output this shift:    Sedated with Versed response to noxious stimulation with grimace and withdrawal pupils equal  Lab Results:  Basename 08/08/11 0350 08/07/11 1855 08/07/11 0530  WBC 16.3* -- 9.6  HGB 8.1* 7.8* --  HCT 22.1* 21.3* --  PLT 61* -- 60*   BMET  Basename 08/08/11 0350 08/07/11 2325  NA 146* 145  K 2.7* 2.7*  CL 121* 121*  CO2 10* 9*  GLUCOSE 143* 131*  BUN 43* 43*  CREATININE 3.95* 3.88*  CALCIUM 5.7* 5.8*    Studies/Results: Ct Head Wo Contrast  08/07/2011  *RADIOLOGY REPORT*  Clinical Data: Altered mental status  CT HEAD WITHOUT CONTRAST  Technique:  Contiguous axial images were obtained from the base of the skull through the vertex without contrast.  Comparison: 04/14/2009  Findings: Postoperative changes from left frontal craniotomy and dural scarring are stable.  Encephalomalacia in the inferior left temporal lobe is stable.  There is a new complex right-sided subdural fluid collection with both fluid density and hyperdense elements.  This would raise suspicion for acute or subacute hemorrhage within a chronic subdural fluid collection.  There is 5 mm midline shift to the left.  Some of this is due to the subdural hematoma and also due to the patient's cranial position.  No evidence of subfalcine  herniation.  The subdural complex fluid collection and/or hematoma measures 4 mm in thickness.  There is probably small amount of extra-axial blood over the right frontal lobe as well.  IMPRESSION: Right supratentorial subdural hematoma containing both acute and chronic elements.  Original Report Authenticated By: Donavan Burnet, M.D.   US Renal Port  08/07/2011  *RADIOLOGY REPORT*  Clinical Data: History of elevation of BUN and creatinine level. History of chronic renal disease.  History of hypertension. There is history of renal arterial stenoses.  RENAL/URINARY TRACT ULTRASOUND COMPLETE  Comparison:  CT 08/30/2006 study.  Findings:  Right Kidney:  Right renal length is 10.5 cm.  There is increased echogenicity of the renal parenchyma.  No hydronephrosis is evident.  No renal cyst or mass is evident.  No calculus or focal parenchymal loss is seen.  Left Kidney:  Left renal length as 11.1 cm.  There is increased echogenicity of renal parenchyma.  No hydronephrosis is evident. Small renal cyst in the lower pole measures 1.0 x 0.8 x 0.6 cm. The cyst appears simple.  No solid mass, calculus, or focal parenchymal loss is seen.  Bladder:  Urinary bladder is decompressed by Foley catheter.  Incidental note is made of ascites.  IMPRESSION: Increased echogenicity of renal parenchyma consistent with chronic medical renal disease.  No hydronephrosis evident.  Small simple appearing left renal cyst.  Ascites.  Original Report Authenticated By: Crawford Givens, M.D.   Dg Chest Port 1 View  08/07/2011  *RADIOLOGY REPORT*  Clinical Data: Intubated.  Central line placement.  PORTABLE CHEST - 1 VIEW  Comparison: 08/28/2006.  Findings: Stable right jugular catheter with its tip in the superior vena cava.  Interval endotracheal tube with its tip in satisfactory position 4 cm above the carina.  Interval minimal linear density at the left lung base.  Otherwise, clear lungs. Normal sized heart.  Unremarkable bones.  IMPRESSION:  1.   Interval minimal linear atelectasis at the left lung base. 2.  A double density is again demonstrated at the aortic arch. Again, follow-up PA and lateral views would be helpful when possible.  Original Report Authenticated By: Darrol Angel, M.D.   Dg Abd Portable 1v  08/07/2011  *RADIOLOGY REPORT*  Clinical Data: Panda placement  PORTABLE ABDOMEN - 1 VIEW  Comparison: None.  Findings: Panda tube is coiled in the stomach near the fundus. Nonspecific gas pattern.  IMPRESSION: As above.  Original Report Authenticated By: Elsie Stain, M.D.   Dg Abd Portable 1v  08/07/2011  *RADIOLOGY REPORT*  Clinical Data: Feeding tube placement.  PORTABLE ABDOMEN - 1 VIEW 08/07/2011 1455 hours:  Comparison: CTA abdomen 08/30/2006.  Findings: Feeding tube looped in the stomach, coursing back up into the esophagus, with its tip in the distal esophagus.  Bowel gas pattern unremarkable.  Iliofemoral atherosclerosis.  IMPRESSION:  1.  Feeding tube looped in the stomach, coursing back up into the esophagus, with its tip in the distal esophagus.  This will need to be withdrawn several centimeters and re-advanced. 2.  No acute abdominal abnormality.  These results will be called to the ordering clinician or representative by the Radiologist Assistant, and communication documented in the PACS Dashboard.  Original Report Authenticated By: Arnell Sieving, M.D.    Assessment/Plan: Encephalopathy obtundation possibly secondary to sub- clinical status patient under general anesthesia with Versed, EEG pending today, CT scan from last night is stable from the one done at [facility], shows small 4 mm subdural with minimal to no mass effect.  LOS: 1 day     Joshu Furukawa P 08/08/2011, 8:02 AM

## 2011-08-08 NOTE — Progress Notes (Signed)
eLink Physician-Brief Progress Note Patient Name: RAYLON LAMSON DOB: 02-15-1955 MRN: [id number]  Date of Service  08/08/2011   HPI/Events of Note  Hypophos and low magnesium  eICU Interventions  Phos and Mag replaced   Intervention Category Intermediate Interventions: Electrolyte abnormality - evaluation and management  DETERDING,ELIZABETH 08/08/2011, 4:47 AM

## 2011-08-08 NOTE — Progress Notes (Signed)
CRITICAL VALUE ALERT  Critical value received:  K 2.7       Ca 5.7  Date of notification:  08/08/11  Time of notification:  1000  Critical value read back:  yes    Nurse who received alert:  *Vito Backers, RN  MD notified (1st page):  Dr. Molli Knock  Time of first page:  1000  MD notified (2nd page):  Time of second page:  Responding MD:  Dr. Molli Knock  Time MD responded:  1000

## 2011-08-08 NOTE — Progress Notes (Signed)
Name: [name] MRN: [id number] DOB: 02/13/1955    LOS: 1  Referring Provider:  Transferred from [facility] Reason for Referral:  Altered mental status  PULMONARY / CRITICAL CARE MEDICINE   Brief patient description:  7/9  Brought to [facility] with altered mental status.  Head CT demonstrated small right subdural hematoma with 4 mm midline shift.  Labs demonstarted multiple metabolic derangements.Transferred to [facility] for farther management.  Interval/subjective Had a seizure ~530 am this am, treated w/ 1 mg ativan. 4098 PCCM team called to bedside for eval of Persistent hypotension not responding to volume challenge and decrease in MS. On bedside eval he is unresponsive. Will bite down when attempting to illicit gag, but has no other response to noxious stimuli. Currently SBP in 60s.   Vital Signs:  Reviewed  Temp:  [94.4 F (34.7 C)-97.5 F (36.4 C)] 97.5 F (36.4 C) (07/10 0400) Pulse Rate:  [59-75] 75  (07/10 1200) Resp:  [13-25] 22  (07/10 1200) BP: (74-111)/(48-88) 98/77 mmHg (07/10 1200) SpO2:  [100 %] 100 % (07/10 1200) Arterial Line BP: (63-112)/(39-71) 106/67 mmHg (07/10 1200) FiO2 (%):  [30 %] 30 % (07/10 1200) Weight:  [82.1 kg (181 lb)] 82.1 kg (181 lb) (07/10 0615) 5 liters n/c  Physical Examination: General:  No distress Neuro:  Nonfocal, follows commands, cough / gag present, somewhat confused, slow to respond. HEENT:  PERRL Neck:  Supple, no JVD Cardiovascular:  RRR, no murmurs Lungs:  CTAB Abdomen:  Soft, nontender, bowel sounds present Musculoskeletal:  Moves all extremities, no edema Skin:  Intact  Active Problems:  NEOP, BNG, CEREBRAL MENINGES  HYPERTENSION, BENIGN ESSENTIAL  HYPERTENSION, KIDNEY DISEASE, UNCONTROLLED  SEIZURE DISORDER  Encephalopathy acute  Hepatitis C without mention of hepatic coma  Diastolic heart failure  Subdural hematoma  Hypokalemia  Thrombocytopenia  Acidosis  Anemia  Malnutrition  Acute respiratory failure  Shock  Altered mental status  ASSESSMENT AND PLAN  PULMONARY  Lab 08/08/11 0400 08/07/11 1013 08/07/11 0455  PHART 7.379 7.336* 7.382  PCO2ART 18.6* 16.0* 13.2*  PO2ART 166.0* 514.0* 160.0*  HCO3 10.7* 8.3* 7.6*  O2SAT 99.8 100.0 99.6   Ventilator Settings: Vent Mode:  [-] PRVC FiO2 (%):  [30 %] 30 % Set Rate:  [22 bmp] 22 bmp Vt Set:  [600 mL] 600 mL PEEP:  [5 cmH20] 5 cmH20 Plateau Pressure:  [17 cmH20-20 cmH20] 18 cmH20 CXR: (pending) ETT: 7/9>>>  A:  Acute Respiratory Failure in setting of Inability to protect airway s/p seizure and complicated by multiple metabolic derangements.  P:   Maintain on full vent support while neurologic status is uncertain. F/u cxr and abg in AM. Send sputum culture, pending.  CARDIOVASCULAR  Lab 08/07/11 1155 08/07/11 0530  TROPONINI -- 0.39*  LATICACIDVEN 0.8 5.3*  PROBNP -- --   ECG:  NA Lines:right IJ CVL 7/9>>> Left rad aline 7/9>>>  A: History of diastolic CHF, no evidence of exacerbation.  History of hypetension. Has mild elevated Trop I. Hard to interpret in the setting of renal failure.  P:  Cardiac enzymes positive. Check BNP. ECHO hyperdynamic with EF of 75-80%, might consider swan if hypotension remains an issue.  A:Circulatory Shock. Unclear etiology. May be residual acidosis vs sepsis vs some mix.  Heart is hyperdynamic.  Volume status appears adequate however given a CVP of 13.  Will consider swan to check PCWP and SVR.  P: CVP 13, lactate 0.8 (will repeat), PCT 0.66, repeat ABG Levophed for MAP >65 Bicarb support to keep  PH >7.2. Cortisol 17.4, will supplement. IVF resuscitation via bicarb.  RENAL  Lab 08/08/11 0900 08/08/11 0350 08/07/11 2325 08/07/11 1714 08/07/11 1155 08/07/11 0530  NA 143 146* 145 143 143 --  K 2.7* 2.7* -- -- -- --  CL 118* 121* 121* 119* 120* --  CO2 11* 10* 9* 9* 8* --  BUN 42* 43* 43* 45* 46* --  CREATININE 3.88* 3.95* 3.88* 4.11* 4.17* --  CALCIUM 5.7* 5.7* 5.8* 5.5* 5.3* --  MG --  1.4* -- 1.5 -- 1.9  PHOS -- 1.4* -- 1.9* -- --   Intake/Output      07/09 0701 - 07/10 0700 07/10 0701 - 07/11 0700   I.V. (mL/kg) 5253.1 (64) 700 (8.5)   Blood 362.5    NG/GT 150    IV Piggyback 1128    Total Intake(mL/kg) 6893.6 (84) 700 (8.5)   Urine (mL/kg/hr) 440 (0.2) 40 (0.1)   Total Output 440 40   Net +6453.6 +660         Foley:  7/9  A:  Acute on chronic renal failure (Cr 5.5), in setting of Dehydration. CKD and renal artery stenosis.  P: Renal adjust meds. CVP monitoring and levophed to ensure adequate filling pressures and MAP.  Renal US pending. Close I&O  Very gentle K replacement. Renal consult called.  Hypokalemia P Gentle replacement in setting of acidosis and low UOP.  Gap/non-gap metabolic acidosis. Likely due to renal failure and hypoperfusion.  P: Recheck lactate Bicarb infusion Q 6 chemistry  D/C NS to avoid hyperchloremia   GASTROINTESTINAL  Lab 08/08/11 0350 08/07/11 0530  AST 42* 49*  ALT 27 33  ALKPHOS 76 91  BILITOT 0.7 0.5  PROT 4.3* 5.0*  ALBUMIN 1.1* 1.4*    A:  History of hepatitis C.  Suspected malnutrition (Alb 1.4). P:   LFT noted. Prealbumin. Consult nutrition for TF.  HEMATOLOGIC  Lab 08/08/11 0350 08/07/11 1855 08/07/11 0530  HGB 8.1* 7.8* 7.5*  HCT 22.1* 21.3* 20.7*  PLT 61* -- 60*  INR -- -- 1.23  APTT -- -- 37   A:  Mild anemia, macrocitic (Hb 8.0, MCV 109).  Thrombocytopenia (Plt 60).  Both likely alcohol related. P:  CBC PTT 37 / INR 1.23  INFECTIOUS  Lab 08/08/11 0350 08/07/11 1155 08/07/11 0530  WBC 16.3* -- 9.6  PROCALCITON 0.66 0.66 0.99   Cultures: BCX2 7/9>>>NTD Sputum 7/9>>>NTD  Antibiotics: Vanc/Zosyn 7/10>>>  A:  No evidence of acute infection but patient is becoming more hypotensive and shocky. P:   Recheck PCT 0.66, will start empiric abx.  ENDOCRINE  Lab 08/07/11 0424  GLUCAP 88   A:  No active issues P:   No intervention required  NEUROLOGIC  7/9  Head CT >>> Small  right subdural hematoma with 4 mm midline shift.   A:  Acute encephalopathy.  History of alcohol abuse.  History of meningioma resection.  History of seizure disorder on Dilantin.  History of medical noncompliance. Had witnessed seizure in ICU. Post-ictal this am.  Wonder if Hep C encephalitis is a possibility here.   P:   Continuous EEG. Thiamine & Folate. Continue Lexapro. Hold Trazodone  Neurosurgery following. Dilantin per pharmacy.  It remains very unclear to me why patient remains hypotensive. Patient is not acidotic now, Cr is improving, CVP is 13 and lactic acid is improving inspite of poor liver and poor renal function.  The patient is critically ill with multiple organ systems failure and requires high complexity decision  making for assessment and support, frequent evaluation and titration of therapies, application of advanced monitoring technologies and extensive interpretation of multiple databases. Critical Care Time devoted to patient care services described in this note is 35 minutes.  Alyson Reedy, M.D. [facility] Pulmonary/Critical Care Medicine. Pager: [phone]. After hours pager: [phone].

## 2011-08-08 NOTE — Progress Notes (Signed)
Pt transported from NICU to MICU. No issues for now.

## 2011-08-08 NOTE — Progress Notes (Signed)
eLink Physician-Brief Progress Note Patient Name: [name] DOB: 09/23/1955 MRN: [id number]  Date of Service  08/08/2011   HPI/Events of Note  Hypokalemia   eICU Interventions  Potassium replaced   Intervention Category Intermediate Interventions: Electrolyte abnormality - evaluation and management  DETERDING,ELIZABETH 08/08/2011, 1:00 AM

## 2011-08-08 NOTE — Procedures (Signed)
Pulmonary Artery Catheter Insertion Procedure Note [name] [id number] [birth year]  Procedure: Insertion of Pulmonary Artery Catheter  Indications: Guide hemodynamic management  Procedure Details Consent: Risks of procedure as well as the alternatives and risks of each were explained to the (patient/caregiver).  Consent for procedure obtained. Time Out: Verified patient identification, verified procedure, site/side was marked, verified correct patient position, special equipment/implants available, medications/allergies/relevent history reviewed, required imaging and test results available.  Performed  Description of Procedure Maximum sterile technique was used including antiseptics, cap, gloves, gown, hand hygiene, mask and sheet. Skin prep: Chlorhexidine; local anesthetic administered Pulmonary Artery Catheter was placed in the left subclavian vein; introducer inserted over guidewire, initial position assessed by monitoring pressure waveform and catheter advanced after balloon inflation.  Evaluation Pressure waveform tracings: good, Pulmonary capillary wedge tracing: good, wedge tracing obtained after inflation of balloon with 1.25 - 1.5 cc. Complications: No apparent complications Patient did tolerate procedure well. Chest X-ray ordered to verify placement.  CXR: pending.  U/S used in placement.  [name] 08/08/2011

## 2011-08-08 NOTE — Progress Notes (Signed)
CRITICAL VALUE ALERT  Critical value received:  K+= 2.5; Ca+=5.6  Date of notification:  08/08/2011  Time of notification:  2201  Critical value read back: yes  Nurse who received alert:  Berdine Dance  MD notified (1st page):  Zubelevitskiy  Time of first page:  2209  MD notified (2nd page):  Time of second page:  Responding MD:  Zubelevitskiy  Time MD responded:  2209

## 2011-08-08 NOTE — Progress Notes (Signed)
Critical call lab values called over to [facility] MD: K+2.7, CO2 9, Ca+ 5.8. SNOs

## 2011-08-08 NOTE — Progress Notes (Signed)
Subjective:  [name] is a 56 year old gentleman with a history of hypertension, chronic renal insufficiency, and history of meningioma status post resection. The patient also has a history of alcohol abuse. The patient has had a left focal seizure, and he has been treated with Keppra and Dilantin. The patient had a seizure event yesterday morning at about 5:30 AM, but the patient has been poorly responsive since that time. The patient currently is intubated and sedated with Versed.   A CT scan of the head was performed on 7/9, revealing a right supratentorial subdural hematoma containing both acute and  chronic elements, a new finding relative to the prior MRI from 2011. There is also unchaged chronic left temporal encephalomalacia and evidence for a prior left frontal craniotomy, also unchanged.  An EEG study is still pending as of this AM.   The patient is on a Versed drip at 5 mg/hr as well as 200 mg twice daily of Dilantin, and 500 mg twice daily of Keppra. Creatinine is 3.95 today.   Objective: Current vital signs: BP 103/82  Pulse 68  Temp 97.5 F (36.4 C) (Axillary)  Resp 24  Ht 5\' 11"  (1.803 m)  Wt 82.1 kg (181 lb)  BMI 25.24 kg/m2  SpO2 100% Vital signs in last 24 hours: Temp:  [94.4 F (34.7 C)-97.5 F (36.4 C)] 97.5 F (36.4 C) (07/10 0400) Pulse Rate:  [58-75] 68  (07/10 0805) Resp:  [13-25] 24  (07/10 0900) BP: (74-111)/(44-88) 103/82 mmHg (07/10 0900) SpO2:  [98 %-100 %] 100 % (07/10 0900) Arterial Line BP: (63-112)/(39-71) 100/63 mmHg (07/10 0900) FiO2 (%):  [30 %-40 %] 30 % (07/10 0900) Weight:  [82.1 kg (181 lb)] 82.1 kg (181 lb) (07/10 0615)  Intake/Output from previous day: 07/09 0701 - 07/10 0700 In: 6893.6 [I.V.:5253.1; Blood:362.5; NG/GT:150; IV Piggyback:1128] Out: 440 [Urine:440] Intake/Output this shift: Total I/O In: 280 [I.V.:280] Out: 40 [Urine:40] Nutritional status: NPO  Neurologic Exam: Ment: Intubated and sedated. No motor or  eye-opening response to auditory, visual or tactile stimuli.  CN: No blink to threat. Pupils 1.5 mm bilaterally and sluggish to non-reactive. No doll's eye reflex present. Weak grimace to noxious stimuli. Sensory/Motor: Mildly increased tone in all 4 extremities. No withdrawal to noxious stimuli, except for triple flexion response to noxious stimuli applied to plantar aspects of feet.  Reflexes: Hyperactive 3+ symmetrically in upper and lower extremities, except 0 at achilles bilaterally. Mute toes bilaterally.   Lab Results: Results for orders placed during the hospital encounter of 08/07/11 (from the past 48 hour(s))  GLUCOSE, CAPILLARY     Status: Normal   Collection Time   08/07/11  4:24 AM      Component Value Range Comment   Glucose-Capillary 88  70 - 99 mg/dL   MRSA PCR SCREENING     Status: Normal   Collection Time   08/07/11  4:28 AM      Component Value Range Comment   MRSA by PCR NEGATIVE  NEGATIVE   BLOOD GAS, ARTERIAL     Status: Abnormal   Collection Time   08/07/11  4:55 AM      Component Value Range Comment   O2 Content 2.0      pH, Arterial 7.382  7.350 - 7.450    pCO2 arterial 13.2 (*) 35.0 - 45.0 mmHg    pO2, Arterial 160.0 (*) 80.0 - 100.0 mmHg    Bicarbonate 7.6 (*) 20.0 - 24.0 mEq/L    TCO2 8.0  0 -  100 mmol/L    Acid-base deficit 17.0 (*) 0.0 - 2.0 mmol/L    O2 Saturation 99.6      Patient temperature 98.6      Allens test (pass/fail) PASS  PASS   URINALYSIS, ROUTINE W REFLEX MICROSCOPIC     Status: Abnormal   Collection Time   08/07/11  5:17 AM      Component Value Range Comment   Color, Urine YELLOW  YELLOW    APPearance CLOUDY (*) CLEAR    Specific Gravity, Urine 1.013  1.005 - 1.030    pH 5.0  5.0 - 8.0    Glucose, UA NEGATIVE  NEGATIVE mg/dL    Hgb urine dipstick MODERATE (*) NEGATIVE    Bilirubin Urine NEGATIVE  NEGATIVE    Ketones, ur 15 (*) NEGATIVE mg/dL    Protein, ur NEGATIVE  NEGATIVE mg/dL    Urobilinogen, UA 0.2  0.0 - 1.0 mg/dL    Nitrite  NEGATIVE  NEGATIVE    Leukocytes, UA NEGATIVE  NEGATIVE   URINE MICROSCOPIC-ADD ON     Status: Normal   Collection Time   08/07/11  5:17 AM      Component Value Range Comment   Squamous Epithelial / LPF RARE  RARE    WBC, UA 0-2  <3 WBC/hpf    RBC / HPF 3-6  <3 RBC/hpf    Bacteria, UA RARE  RARE    Urine-Other MUCOUS PRESENT     CBC WITH DIFFERENTIAL     Status: Abnormal   Collection Time   08/07/11  5:30 AM      Component Value Range Comment   WBC 9.6  4.0 - 10.5 K/uL    RBC 2.17 (*) 4.22 - 5.81 MIL/uL    Hemoglobin 7.5 (*) 13.0 - 17.0 g/dL    HCT 45.4 (*) 09.8 - 52.0 %    MCV 95.4  78.0 - 100.0 fL    MCH 34.6 (*) 26.0 - 34.0 pg    MCHC 36.2 (*) 30.0 - 36.0 g/dL    RDW 11.9  14.7 - 82.9 %    Platelets 60 (*) 150 - 400 K/uL PLATELET COUNT CONFIRMED BY SMEAR   Neutrophils Relative 80 (*) 43 - 77 %    Lymphocytes Relative 10 (*) 12 - 46 %    Monocytes Relative 10  3 - 12 %    Eosinophils Relative 0  0 - 5 %    Basophils Relative 0  0 - 1 %    Neutro Abs 7.6  1.7 - 7.7 K/uL    Lymphs Abs 1.0  0.7 - 4.0 K/uL    Monocytes Absolute 1.0  0.1 - 1.0 K/uL    Eosinophils Absolute 0.0  0.0 - 0.7 K/uL    Basophils Absolute 0.0  0.0 - 0.1 K/uL    RBC Morphology ACANTHOCYTES     COMPREHENSIVE METABOLIC PANEL     Status: Abnormal   Collection Time   08/07/11  5:30 AM      Component Value Range Comment   Sodium 144  135 - 145 mEq/L    Potassium 2.7 (*) 3.5 - 5.1 mEq/L    Chloride 115 (*) 96 - 112 mEq/L    CO2 9 (*) 19 - 32 mEq/L    Glucose, Bld 106 (*) 70 - 99 mg/dL    BUN 57 (*) 6 - 23 mg/dL    Creatinine, Ser 5.62 (*) 0.50 - 1.35 mg/dL    Calcium 6.9 (*) 8.4 - 10.5  mg/dL    Total Protein 5.0 (*) 6.0 - 8.3 g/dL    Albumin 1.4 (*) 3.5 - 5.2 g/dL    AST 49 (*) 0 - 37 U/L    ALT 33  0 - 53 U/L    Alkaline Phosphatase 91  39 - 117 U/L    Total Bilirubin 0.5  0.3 - 1.2 mg/dL    GFR calc non Af Amer 11 (*) >90 mL/min    GFR calc Af Amer 13 (*) >90 mL/min   APTT     Status: Normal    Collection Time   08/07/11  5:30 AM      Component Value Range Comment   aPTT 37  24 - 37 seconds   PROTIME-INR     Status: Abnormal   Collection Time   08/07/11  5:30 AM      Component Value Range Comment   Prothrombin Time 15.8 (*) 11.6 - 15.2 seconds    INR 1.23  0.00 - 1.49   CARDIAC PANEL(CRET KIN+CKTOT+MB+TROPI)     Status: Abnormal   Collection Time   08/07/11  5:30 AM      Component Value Range Comment   Total CK 1066 (*) 7 - 232 U/L    CK, MB 7.0 (*) 0.3 - 4.0 ng/mL    Troponin I 0.39 (*) <0.30 ng/mL    Relative Index 0.7  0.0 - 2.5   LACTIC ACID, PLASMA     Status: Abnormal   Collection Time   08/07/11  5:30 AM      Component Value Range Comment   Lactic Acid, Venous 5.3 (*) 0.5 - 2.2 mmol/L   AMMONIA     Status: Abnormal   Collection Time   08/07/11  5:30 AM      Component Value Range Comment   Ammonia 126 (*) 11 - 60 umol/L   PREALBUMIN     Status: Abnormal   Collection Time   08/07/11  5:30 AM      Component Value Range Comment   Prealbumin 7.7 (*) 17.0 - 34.0 mg/dL   OSMOLALITY     Status: Abnormal   Collection Time   08/07/11  5:30 AM      Component Value Range Comment   Osmolality 313 (*) 275 - 300 mOsm/kg   PROCALCITONIN     Status: Normal   Collection Time   08/07/11  5:30 AM      Component Value Range Comment   Procalcitonin 0.99     MAGNESIUM     Status: Normal   Collection Time   08/07/11  5:30 AM      Component Value Range Comment   Magnesium 1.9  1.5 - 2.5 mg/dL   BLOOD GAS, ARTERIAL     Status: Abnormal   Collection Time   08/07/11 10:13 AM      Component Value Range Comment   FIO2 1.00      Delivery systems VENTILATOR      Mode PRESSURE REGULATED VOLUME CONTROL      VT 600      Rate 22      Peep/cpap 5.0      pH, Arterial 7.336 (*) 7.350 - 7.450    pCO2 arterial 16.0 (*) 35.0 - 45.0 mmHg    pO2, Arterial 514.0 (*) 80.0 - 100.0 mmHg    Bicarbonate 8.3 (*) 20.0 - 24.0 mEq/L    TCO2 8.8  0 - 100 mmol/L    Acid-base deficit 16.7 (*) 0.0 -  2.0 mmol/L     O2 Saturation 100.0      Patient temperature 98.6      Collection site A-LINE      Drawn by [phone]      Sample type ARTERIAL DRAW      Allens test (pass/fail) PASS  PASS   CULTURE, BLOOD (ROUTINE X 2)     Status: Normal (Preliminary result)   Collection Time   08/07/11 10:55 AM      Component Value Range Comment   Specimen Description BLOOD RIGHT ARM      Special Requests BOTTLES DRAWN AEROBIC AND ANAEROBIC 10CC      Culture  Setup Time 08/07/2011 16:42      Culture        Value:        BLOOD CULTURE RECEIVED NO GROWTH TO DATE CULTURE WILL BE HELD FOR 5 DAYS BEFORE ISSUING A FINAL NEGATIVE REPORT   Report Status PENDING     CULTURE, BLOOD (ROUTINE X 2)     Status: Normal (Preliminary result)   Collection Time   08/07/11 11:00 AM      Component Value Range Comment   Specimen Description BLOOD RIGHT HAND      Special Requests BOTTLES DRAWN AEROBIC AND ANAEROBIC 10CC      Culture  Setup Time 08/07/2011 16:42      Culture        Value:        BLOOD CULTURE RECEIVED NO GROWTH TO DATE CULTURE WILL BE HELD FOR 5 DAYS BEFORE ISSUING A FINAL NEGATIVE REPORT   Report Status PENDING     STREP PNEUMONIAE URINARY ANTIGEN     Status: Normal   Collection Time   08/07/11 11:13 AM      Component Value Range Comment   Strep Pneumo Urinary Antigen NEGATIVE  NEGATIVE   LACTIC ACID, PLASMA     Status: Normal   Collection Time   08/07/11 11:55 AM      Component Value Range Comment   Lactic Acid, Venous 0.8  0.5 - 2.2 mmol/L   PROCALCITONIN     Status: Normal   Collection Time   08/07/11 11:55 AM      Component Value Range Comment   Procalcitonin 0.66     CORTISOL     Status: Normal   Collection Time   08/07/11 11:55 AM      Component Value Range Comment   Cortisol, Plasma 17.4     BASIC METABOLIC PANEL     Status: Abnormal   Collection Time   08/07/11 11:55 AM      Component Value Range Comment   Sodium 143  135 - 145 mEq/L    Potassium <2.0 (*) 3.5 - 5.1 mEq/L    Chloride 120 (*) 96 - 112 mEq/L     CO2 8 (*) 19 - 32 mEq/L    Glucose, Bld 120 (*) 70 - 99 mg/dL    BUN 46 (*) 6 - 23 mg/dL    Creatinine, Ser 9.81 (*) 0.50 - 1.35 mg/dL    Calcium 5.3 (*) 8.4 - 10.5 mg/dL    GFR calc non Af Amer 15 (*) >90 mL/min    GFR calc Af Amer 17 (*) >90 mL/min   TYPE AND SCREEN     Status: Normal (Preliminary result)   Collection Time   08/07/11 11:55 AM      Component Value Range Comment   ABO/RH(D) A POS      Antibody Screen  NEG      Sample Expiration 08/10/2011      Unit Number [id number]      Blood Component Type RED CELLS,LR      Unit division 00      Status of Unit ISSUED,FINAL      Transfusion Status OK TO TRANSFUSE      Crossmatch Result Compatible      Unit Number [id number]      Blood Component Type RED CELLS,LR      Unit division 00      Status of Unit ALLOCATED      Transfusion Status OK TO TRANSFUSE      Crossmatch Result Compatible      Unit Number [id number]      Blood Component Type RED CELLS,LR      Unit division 00      Status of Unit ALLOCATED      Transfusion Status OK TO TRANSFUSE      Crossmatch Result Compatible     PREPARE RBC (CROSSMATCH)     Status: Normal   Collection Time   08/07/11 11:55 AM      Component Value Range Comment   Order Confirmation ORDER PROCESSED BY BLOOD BANK     BASIC METABOLIC PANEL     Status: Abnormal   Collection Time   08/07/11  5:14 PM      Component Value Range Comment   Sodium 143  135 - 145 mEq/L    Potassium 2.7 (*) 3.5 - 5.1 mEq/L    Chloride 119 (*) 96 - 112 mEq/L    CO2 9 (*) 19 - 32 mEq/L    Glucose, Bld 122 (*) 70 - 99 mg/dL    BUN 45 (*) 6 - 23 mg/dL    Creatinine, Ser 2.84 (*) 0.50 - 1.35 mg/dL    Calcium 5.5 (*) 8.4 - 10.5 mg/dL    GFR calc non Af Amer 15 (*) >90 mL/min    GFR calc Af Amer 17 (*) >90 mL/min   PHOSPHORUS     Status: Abnormal   Collection Time   08/07/11  5:14 PM      Component Value Range Comment   Phosphorus 1.9 (*) 2.3 - 4.6 mg/dL   MAGNESIUM     Status: Normal   Collection Time   08/07/11  5:14 PM       Component Value Range Comment   Magnesium 1.5  1.5 - 2.5 mg/dL   HEMOGLOBIN AND HEMATOCRIT, BLOOD     Status: Abnormal   Collection Time   08/07/11  6:55 PM      Component Value Range Comment   Hemoglobin 7.8 (*) 13.0 - 17.0 g/dL    HCT 13.2 (*) 44.0 - 52.0 %   BASIC METABOLIC PANEL     Status: Abnormal   Collection Time   08/07/11 11:25 PM      Component Value Range Comment   Sodium 145  135 - 145 mEq/L    Potassium 2.7 (*) 3.5 - 5.1 mEq/L    Chloride 121 (*) 96 - 112 mEq/L    CO2 9 (*) 19 - 32 mEq/L    Glucose, Bld 131 (*) 70 - 99 mg/dL    BUN 43 (*) 6 - 23 mg/dL    Creatinine, Ser 1.02 (*) 0.50 - 1.35 mg/dL    Calcium 5.8 (*) 8.4 - 10.5 mg/dL    GFR calc non Af Amer 16 (*) >90 mL/min    GFR calc Af Amer 19 (*) >  90 mL/min   CBC     Status: Abnormal   Collection Time   08/08/11  3:50 AM      Component Value Range Comment   WBC 16.3 (*) 4.0 - 10.5 K/uL    RBC 2.43 (*) 4.22 - 5.81 MIL/uL    Hemoglobin 8.1 (*) 13.0 - 17.0 g/dL    HCT 30.8 (*) 65.7 - 52.0 %    MCV 90.9  78.0 - 100.0 fL    MCH 33.3  26.0 - 34.0 pg    MCHC 36.7 (*) 30.0 - 36.0 g/dL    RDW 84.6 (*) 96.2 - 15.5 %    Platelets 61 (*) 150 - 400 K/uL CONSISTENT WITH PREVIOUS RESULT  COMPREHENSIVE METABOLIC PANEL     Status: Abnormal   Collection Time   08/08/11  3:50 AM      Component Value Range Comment   Sodium 146 (*) 135 - 145 mEq/L    Potassium 2.7 (*) 3.5 - 5.1 mEq/L    Chloride 121 (*) 96 - 112 mEq/L    CO2 10 (*) 19 - 32 mEq/L    Glucose, Bld 143 (*) 70 - 99 mg/dL    BUN 43 (*) 6 - 23 mg/dL    Creatinine, Ser 9.52 (*) 0.50 - 1.35 mg/dL    Calcium 5.7 (*) 8.4 - 10.5 mg/dL    Total Protein 4.3 (*) 6.0 - 8.3 g/dL    Albumin 1.1 (*) 3.5 - 5.2 g/dL    AST 42 (*) 0 - 37 U/L    ALT 27  0 - 53 U/L    Alkaline Phosphatase 76  39 - 117 U/L    Total Bilirubin 0.7  0.3 - 1.2 mg/dL    GFR calc non Af Amer 16 (*) >90 mL/min    GFR calc Af Amer 18 (*) >90 mL/min   PROCALCITONIN     Status: Normal   Collection Time    08/08/11  3:50 AM      Component Value Range Comment   Procalcitonin 0.66     MAGNESIUM     Status: Abnormal   Collection Time   08/08/11  3:50 AM      Component Value Range Comment   Magnesium 1.4 (*) 1.5 - 2.5 mg/dL   PHOSPHORUS     Status: Abnormal   Collection Time   08/08/11  3:50 AM      Component Value Range Comment   Phosphorus 1.4 (*) 2.3 - 4.6 mg/dL   SODIUM, URINE, RANDOM     Status: Normal   Collection Time   08/08/11  3:50 AM      Component Value Range Comment   Sodium, Ur 13     CREATININE, URINE, RANDOM     Status: Normal   Collection Time   08/08/11  3:50 AM      Component Value Range Comment   Creatinine, Urine 136.34     BLOOD GAS, ARTERIAL     Status: Abnormal   Collection Time   08/08/11  4:00 AM      Component Value Range Comment   FIO2 0.30      Mode PRESSURE REGULATED VOLUME CONTROL      VT 600      Rate 22      Peep/cpap 5.0      pH, Arterial 7.379  7.350 - 7.450    pCO2 arterial 18.6 (*) 35.0 - 45.0 mmHg RBV CALLED RESULTS TO RN STEPANIE BELL@ 0409 ON [id number]  BY PPALMER BSRT,RCP   pO2, Arterial 166.0 (*) 80.0 - 100.0 mmHg    Bicarbonate 10.7 (*) 20.0 - 24.0 mEq/L    TCO2 11.3  0 - 100 mmol/L    Acid-base deficit 13.5 (*) 0.0 - 2.0 mmol/L    O2 Saturation 99.8      Patient temperature 98.6       Recent Results (from the past 240 hour(s))  MRSA PCR SCREENING     Status: Normal   Collection Time   08/07/11  4:28 AM      Component Value Range Status Comment   MRSA by PCR NEGATIVE  NEGATIVE Final   CULTURE, BLOOD (ROUTINE X 2)     Status: Normal (Preliminary result)   Collection Time   08/07/11 10:55 AM      Component Value Range Status Comment   Specimen Description BLOOD RIGHT ARM   Final    Special Requests BOTTLES DRAWN AEROBIC AND ANAEROBIC 10CC   Final    Culture  Setup Time 08/07/2011 16:42   Final    Culture     Final    Value:        BLOOD CULTURE RECEIVED NO GROWTH TO DATE CULTURE WILL BE HELD FOR 5 DAYS BEFORE ISSUING A FINAL NEGATIVE  REPORT   Report Status PENDING   Incomplete   CULTURE, BLOOD (ROUTINE X 2)     Status: Normal (Preliminary result)   Collection Time   08/07/11 11:00 AM      Component Value Range Status Comment   Specimen Description BLOOD RIGHT HAND   Final    Special Requests BOTTLES DRAWN AEROBIC AND ANAEROBIC 10CC   Final    Culture  Setup Time 08/07/2011 16:42   Final    Culture     Final    Value:        BLOOD CULTURE RECEIVED NO GROWTH TO DATE CULTURE WILL BE HELD FOR 5 DAYS BEFORE ISSUING A FINAL NEGATIVE REPORT   Report Status PENDING   Incomplete     Lipid Panel No results found for this basename: CHOL,TRIG,HDL,CHOLHDL,VLDL,LDLCALC in the last 72 hours  Studies/Results: Ct Head Wo Contrast  08/07/2011  *RADIOLOGY REPORT*  Clinical Data: Altered mental status  CT HEAD WITHOUT CONTRAST  Technique:  Contiguous axial images were obtained from the base of the skull through the vertex without contrast.  Comparison: 04/14/2009  Findings: Postoperative changes from left frontal craniotomy and dural scarring are stable.  Encephalomalacia in the inferior left temporal lobe is stable.  There is a new complex right-sided subdural fluid collection with both fluid density and hyperdense elements.  This would raise suspicion for acute or subacute hemorrhage within a chronic subdural fluid collection.  There is 5 mm midline shift to the left.  Some of this is due to the subdural hematoma and also due to the patient's cranial position.  No evidence of subfalcine herniation.  The subdural complex fluid collection and/or hematoma measures 4 mm in thickness.  There is probably small amount of extra-axial blood over the right frontal lobe as well.  IMPRESSION: Right supratentorial subdural hematoma containing both acute and chronic elements.  Original Report Authenticated By: Donavan Burnet, M.D.   US Renal Port  08/07/2011  *RADIOLOGY REPORT*  Clinical Data: History of elevation of BUN and creatinine level. History of  chronic renal disease.  History of hypertension. There is history of renal arterial stenoses.  RENAL/URINARY TRACT ULTRASOUND COMPLETE  Comparison:  CT 08/30/2006 study.  Findings:  Right Kidney:  Right renal length is 10.5 cm.  There is increased echogenicity of the renal parenchyma.  No hydronephrosis is evident.  No renal cyst or mass is evident.  No calculus or focal parenchymal loss is seen.  Left Kidney:  Left renal length as 11.1 cm.  There is increased echogenicity of renal parenchyma.  No hydronephrosis is evident. Small renal cyst in the lower pole measures 1.0 x 0.8 x 0.6 cm. The cyst appears simple.  No solid mass, calculus, or focal parenchymal loss is seen.  Bladder:  Urinary bladder is decompressed by Foley catheter.  Incidental note is made of ascites.  IMPRESSION: Increased echogenicity of renal parenchyma consistent with chronic medical renal disease.  No hydronephrosis evident.  Small simple appearing left renal cyst.  Ascites.  Original Report Authenticated By: Crawford Givens, M.D.   Dg Chest Port 1 View  08/08/2011  *RADIOLOGY REPORT*  Clinical Data: Intubated patient.  PORTABLE CHEST - 1 VIEW  Comparison: Chest 08/07/2011.  Findings: Endotracheal tube remains in place in good position with tip at the clavicular heads.  Right IJ catheter is also in good position.  New feeding tube has its tip in the stomach.  There is basilar atelectasis, increased since the prior study and worse on the left.  No pneumothorax.  Heart size normal.  IMPRESSION:  1.  Feeding tube tip is in the stomach. 2.  Increased basilar atelectasis, worse on the left.  Original Report Authenticated By: Bernadene Bell. Maricela Curet, M.D.   Dg Chest Port 1 View  08/07/2011  *RADIOLOGY REPORT*  Clinical Data: Intubated.  Central line placement.  PORTABLE CHEST - 1 VIEW  Comparison: 08/28/2006.  Findings: Stable right jugular catheter with its tip in the superior vena cava.  Interval endotracheal tube with its tip in satisfactory position  4 cm above the carina.  Interval minimal linear density at the left lung base.  Otherwise, clear lungs. Normal sized heart.  Unremarkable bones.  IMPRESSION:  1.  Interval minimal linear atelectasis at the left lung base. 2.  A double density is again demonstrated at the aortic arch. Again, follow-up PA and lateral views would be helpful when possible.  Original Report Authenticated By: Darrol Angel, M.D.   Dg Abd Portable 1v  08/07/2011  *RADIOLOGY REPORT*  Clinical Data: Panda placement  PORTABLE ABDOMEN - 1 VIEW  Comparison: None.  Findings: Panda tube is coiled in the stomach near the fundus. Nonspecific gas pattern.  IMPRESSION: As above.  Original Report Authenticated By: Elsie Stain, M.D.   Dg Abd Portable 1v  08/07/2011  *RADIOLOGY REPORT*  Clinical Data: Feeding tube placement.  PORTABLE ABDOMEN - 1 VIEW 08/07/2011 1455 hours:  Comparison: CTA abdomen 08/30/2006.  Findings: Feeding tube looped in the stomach, coursing back up into the esophagus, with its tip in the distal esophagus.  Bowel gas pattern unremarkable.  Iliofemoral atherosclerosis.  IMPRESSION:  1.  Feeding tube looped in the stomach, coursing back up into the esophagus, with its tip in the distal esophagus.  This will need to be withdrawn several centimeters and re-advanced. 2.  No acute abdominal abnormality.  These results will be called to the ordering clinician or representative by the Radiologist Assistant, and communication documented in the PACS Dashboard.  Original Report Authenticated By: Arnell Sieving, M.D.    Medications:  Scheduled:   . antiseptic oral rinse  15 mL Mouth Rinse QID  . calcium gluconate  1 g Intravenous Once  . chlorhexidine  15  mL Mouth/Throat BID  . escitalopram  20 mg Oral Daily  . feeding supplement  30 mL Per Tube Daily  . folic acid  1 mg Oral Daily  . levetiracetam  500 mg Intravenous Q12H  . magnesium sulfate 1 - 4 g bolus IVPB  2 g Intravenous Once  . pantoprazole (PROTONIX) IV   40 mg Intravenous Q24H  . phenytoin (DILANTIN) IV  200 mg Intravenous Q12H  . potassium chloride  10 mEq Intravenous Q1 Hr x 4  . potassium chloride  10 mEq Intravenous Q1 Hr x 4  . potassium chloride  10 mEq Intravenous Q1 Hr x 4  . potassium chloride  10 mEq Intravenous Q1 Hr x 6  . potassium chloride  40 mEq Per Tube Once  . potassium chloride      . sodium phosphate  Dextrose 5% IVPB  30 mmol Intravenous Once  . thiamine  100 mg Oral Daily  . DISCONTD: isosorbide-hydrALAZINE  2 tablet Oral TID    Assessment/Plan:  1. Altered mental status. Most likely secondary to subacute right supratentorial subdural hematoma diagnosed this admission, in conjunction with postictal state from breakthrough seizure. Possibility of subclinical seizures cannot be excluded. Currently on Versed gtt. EEG pending.   2. Seizure disorder with a history of medication noncompliance. Continue Dilantin 200 mg IV BID. Obtain Dilantin level. Calculate corrected level given low albumin in this patient. Continue Keppra 500 mg IV BID.  3. History of meningioma resection.  4. History of EtOH abuse. Currently on Versed gtt. Will need to be switched to CIWA protocol when Versed discontinued.  5. CKD.   6. Hypokalemia. Monitor and correct potassium level.  7. Mild hypomagnesemia on last blood draw. Monitor and correct potassium level.  8. Mild hypocalcemia. Laboratory value is 5.7, with level corrected for albumin of 8.0. Monitor and correct.  9. Anemia.     LOS: 1 day   @Electronically  signed: Dr. Caryl Pina 08/08/2011  9:55 AM

## 2011-08-08 NOTE — Progress Notes (Signed)
Nutrition Follow-up  Intervention:    Continue slow TF advancement.  Assessment:    Pt with electrolyte abnormalities which are being corrected. Will continue slow TF advancement.  Per RN pt is tolerating TF advancement. Currently @ 25 ml/hr. Patient is currently intubated on ventilator supportfor acute respiratory failure with inability to protect airway s/p witnessed seizure in ICU and complicated by multiple metabolic derangements.   MV: 12.8 Temp: 36.4 C  Patient has nasoenteric feeding tube in place with tip of tube near the fundus. Osmolite 1.2 is infusing @ 25 ml/hr. 30 ml Prostat via tube daily. Tube feeding regimen currently providing 820 kcal, 48 grams protein, and 492 ml H2O.   No family in room.   Diet Order:  NPO  Meds: Scheduled Meds:   . antiseptic oral rinse  15 mL Mouth Rinse QID  . calcium gluconate  1 g Intravenous Once  . chlorhexidine  15 mL Mouth/Throat BID  . escitalopram  20 mg Oral Daily  . feeding supplement  30 mL Per Tube Daily  . fentaNYL      . folic acid  1 mg Oral Daily  . hydrocortisone sodium succinate  50 mg Intravenous Q6H  . levetiracetam  500 mg Intravenous Q12H  . magnesium sulfate 1 - 4 g bolus IVPB  2 g Intravenous Once  . pantoprazole (PROTONIX) IV  40 mg Intravenous Q24H  . phenytoin (DILANTIN) IV  200 mg Intravenous Q12H  . potassium chloride  10 mEq Intravenous Q1 Hr x 4  . potassium chloride  10 mEq Intravenous Q1 Hr x 4  . potassium chloride  10 mEq Intravenous Q1 Hr x 6  . potassium chloride  40 mEq Per Tube Once  . potassium chloride      . sodium phosphate  Dextrose 5% IVPB  30 mmol Intravenous Once  . thiamine  100 mg Oral Daily   Continuous Infusions:   . feeding supplement (OSMOLITE 1.2 CAL) 1,000 mL (08/07/11 1750)  . midazolam (VERSED) infusion 5 mg/hr (08/08/11 1200)  . norepinephrine (LEVOPHED) Adult infusion    . norepinephrine (LEVOPHED) Adult infusion 15 mcg/min (08/08/11 1034)  . norepinephrine (LEVOPHED)  Adult infusion 15 mcg/min (08/08/11 1300)  .  sodium bicarbonate infusion 1000 mL 100 mL/hr at 08/08/11 0400   PRN Meds:.sodium chloride  Labs:  CMP     Component Value Date/Time   NA 143 08/08/2011 0900   K 2.7* 08/08/2011 0900   CL 118* 08/08/2011 0900   CO2 11* 08/08/2011 0900   GLUCOSE 233* 08/08/2011 0900   BUN 42* 08/08/2011 0900   CREATININE 3.88* 08/08/2011 0900   CALCIUM 5.7* 08/08/2011 0900   PROT 4.3* 08/08/2011 0350   ALBUMIN 1.1* 08/08/2011 0350   AST 42* 08/08/2011 0350   ALT 27 08/08/2011 0350   ALKPHOS 76 08/08/2011 0350   BILITOT 0.7 08/08/2011 0350   GFRNONAA 16* 08/08/2011 0900   GFRAA 19* 08/08/2011 0900   Phosphorus  Date/Time Value Range Status  08/08/2011  3:50 AM 1.4* 2.3 - 4.6 mg/dL Final  0/03/7251  6:64 PM 1.9* 2.3 - 4.6 mg/dL Final   Magnesium  Date/Time Value Range Status  08/08/2011  3:50 AM 1.4* 1.5 - 2.5 mg/dL Final  4/0/3474  2:59 PM 1.5  1.5 - 2.5 mg/dL Final  06/03/3873  6:43 AM 1.9  1.5 - 2.5 mg/dL Final   Ammonia  Date/Time Value Range Status  08/07/2011  5:30 AM 126* 11 - 60 umol/L Final    Intake/Output Summary (Last  24 hours) at 08/08/11 1601 Last data filed at 08/08/11 1300  Gross per 24 hour  Intake 4231.28 ml  Output    295 ml  Net 3936.28 ml  Last BM: 7/9  Weight Status:   181 lbs 7/10  Re-estimated needs:  1755 kcal; 85-110 grams protein  Nutrition Dx:  Inadequate oral intake r/t inability to eat AEB NPO status; ongoing.  Goal: Pt to meet >/= 90% of their estimated nutrition needs; not met.  Monitor:  TF tolerance, vent status, labs   Kendell Bane RD, LDN, CNSC [phone] Pager [phone] After Hours Pager

## 2011-08-08 NOTE — Consult Note (Signed)
Reason for Consult:CKD/AKI Referring Physician: Dr. Maury Dus is an 56 y.o. male.  HPI: 56 yr male with hx of HTN, DM ? Duration, hx Stage 3 CKD with Cr in 2s  4 yr ago, no recent values, hx sz disorder,S/P meningioma resx, Hep C now admitted with sz.  Found to have small SDH.  Has resp failure,unexplained schock, acidemia.  Cause of renal dz undefined here, and ? F/U.  Entub and sedated and hx per chart.  Had ^ LA on admit an now going down.   Past Medical History  Diagnosis Date  . HTN (hypertension), malignant   . CKD (chronic kidney disease), stage III   . Renal artery stenosis     moderate  . Meningioma     s/p resection  . Seizure disorder   . Alcohol abuse     hx  . TIA (transient ischemic attack)   . Diastolic CHF, acute     acute?; echo 7/10 with EF 65%, moderate LVH  . HCV (hepatitis C virus)     Past Surgical History  Procedure Date  . Brain surgery     meningioma resection    No family history on file.  Social History:  does not have a smoking history on file. He does not have any smokeless tobacco history on file. His alcohol and drug histories not on file.  Allergies: Allergies not on file  Medications:  I have reviewed the patient's current medications. Prior to Admission:  Prescriptions prior to admission  Medication Sig Dispense Refill  . amLODipine (NORVASC) 5 MG tablet Take 5 mg by mouth daily.        Marland Kitchen aspirin 81 MG EC tablet Take 81 mg by mouth daily.        . carvedilol (COREG) 25 MG tablet Take 25 mg by mouth 2 (two) times daily.        . colchicine 0.6 MG tablet Take 0.6 mg by mouth 2 (two) times daily as needed.       Marland Kitchen eplerenone (INSPRA) 50 MG tablet Take 50 mg by mouth daily.        Marland Kitchen escitalopram (LEXAPRO) 20 MG tablet Take 20 mg by mouth daily.        . isosorbide-hydrALAZINE (BIDIL) 20-37.5 MG per tablet Take 2 tablets by mouth 3 (three) times daily.        Marland Kitchen lisinopril (PRINIVIL,ZESTRIL) 20 MG tablet Take 20 mg by mouth  daily.        . minoxidil (LONITEN) 2.5 MG tablet Take 2.5 mg by mouth 2 (two) times daily.        . phenytoin (DILANTIN) 100 MG ER capsule Take 200 mg by mouth 2 (two) times daily.        . traZODone (DESYREL) 100 MG tablet Take 100 mg by mouth at bedtime.           Results for orders placed during the hospital encounter of 08/07/11 (from the past 48 hour(s))  GLUCOSE, CAPILLARY     Status: Normal   Collection Time   08/07/11  4:24 AM      Component Value Range Comment   Glucose-Capillary 88  70 - 99 mg/dL   MRSA PCR SCREENING     Status: Normal   Collection Time   08/07/11  4:28 AM      Component Value Range Comment   MRSA by PCR NEGATIVE  NEGATIVE   BLOOD GAS, ARTERIAL     Status: Abnormal  Collection Time   08/07/11  4:55 AM      Component Value Range Comment   O2 Content 2.0      pH, Arterial 7.382  7.350 - 7.450    pCO2 arterial 13.2 (*) 35.0 - 45.0 mmHg    pO2, Arterial 160.0 (*) 80.0 - 100.0 mmHg    Bicarbonate 7.6 (*) 20.0 - 24.0 mEq/L    TCO2 8.0  0 - 100 mmol/L    Acid-base deficit 17.0 (*) 0.0 - 2.0 mmol/L    O2 Saturation 99.6      Patient temperature 98.6      Allens test (pass/fail) PASS  PASS   URINALYSIS, ROUTINE W REFLEX MICROSCOPIC     Status: Abnormal   Collection Time   08/07/11  5:17 AM      Component Value Range Comment   Color, Urine YELLOW  YELLOW    APPearance CLOUDY (*) CLEAR    Specific Gravity, Urine 1.013  1.005 - 1.030    pH 5.0  5.0 - 8.0    Glucose, UA NEGATIVE  NEGATIVE mg/dL    Hgb urine dipstick MODERATE (*) NEGATIVE    Bilirubin Urine NEGATIVE  NEGATIVE    Ketones, ur 15 (*) NEGATIVE mg/dL    Protein, ur NEGATIVE  NEGATIVE mg/dL    Urobilinogen, UA 0.2  0.0 - 1.0 mg/dL    Nitrite NEGATIVE  NEGATIVE    Leukocytes, UA NEGATIVE  NEGATIVE   URINE MICROSCOPIC-ADD ON     Status: Normal   Collection Time   08/07/11  5:17 AM      Component Value Range Comment   Squamous Epithelial / LPF RARE  RARE    WBC, UA 0-2  <3 WBC/hpf    RBC / HPF 3-6   <3 RBC/hpf    Bacteria, UA RARE  RARE    Urine-Other MUCOUS PRESENT     CBC WITH DIFFERENTIAL     Status: Abnormal   Collection Time   08/07/11  5:30 AM      Component Value Range Comment   WBC 9.6  4.0 - 10.5 K/uL    RBC 2.17 (*) 4.22 - 5.81 MIL/uL    Hemoglobin 7.5 (*) 13.0 - 17.0 g/dL    HCT 86.5 (*) 78.4 - 52.0 %    MCV 95.4  78.0 - 100.0 fL    MCH 34.6 (*) 26.0 - 34.0 pg    MCHC 36.2 (*) 30.0 - 36.0 g/dL    RDW 69.6  29.5 - 28.4 %    Platelets 60 (*) 150 - 400 K/uL PLATELET COUNT CONFIRMED BY SMEAR   Neutrophils Relative 80 (*) 43 - 77 %    Lymphocytes Relative 10 (*) 12 - 46 %    Monocytes Relative 10  3 - 12 %    Eosinophils Relative 0  0 - 5 %    Basophils Relative 0  0 - 1 %    Neutro Abs 7.6  1.7 - 7.7 K/uL    Lymphs Abs 1.0  0.7 - 4.0 K/uL    Monocytes Absolute 1.0  0.1 - 1.0 K/uL    Eosinophils Absolute 0.0  0.0 - 0.7 K/uL    Basophils Absolute 0.0  0.0 - 0.1 K/uL    RBC Morphology ACANTHOCYTES     COMPREHENSIVE METABOLIC PANEL     Status: Abnormal   Collection Time   08/07/11  5:30 AM      Component Value Range Comment   Sodium 144  135 -  145 mEq/L    Potassium 2.7 (*) 3.5 - 5.1 mEq/L    Chloride 115 (*) 96 - 112 mEq/L    CO2 9 (*) 19 - 32 mEq/L    Glucose, Bld 106 (*) 70 - 99 mg/dL    BUN 57 (*) 6 - 23 mg/dL    Creatinine, Ser 1.61 (*) 0.50 - 1.35 mg/dL    Calcium 6.9 (*) 8.4 - 10.5 mg/dL    Total Protein 5.0 (*) 6.0 - 8.3 g/dL    Albumin 1.4 (*) 3.5 - 5.2 g/dL    AST 49 (*) 0 - 37 U/L    ALT 33  0 - 53 U/L    Alkaline Phosphatase 91  39 - 117 U/L    Total Bilirubin 0.5  0.3 - 1.2 mg/dL    GFR calc non Af Amer 11 (*) >90 mL/min    GFR calc Af Amer 13 (*) >90 mL/min   APTT     Status: Normal   Collection Time   08/07/11  5:30 AM      Component Value Range Comment   aPTT 37  24 - 37 seconds   PROTIME-INR     Status: Abnormal   Collection Time   08/07/11  5:30 AM      Component Value Range Comment   Prothrombin Time 15.8 (*) 11.6 - 15.2 seconds    INR 1.23   0.00 - 1.49   CARDIAC PANEL(CRET KIN+CKTOT+MB+TROPI)     Status: Abnormal   Collection Time   08/07/11  5:30 AM      Component Value Range Comment   Total CK 1066 (*) 7 - 232 U/L    CK, MB 7.0 (*) 0.3 - 4.0 ng/mL    Troponin I 0.39 (*) <0.30 ng/mL    Relative Index 0.7  0.0 - 2.5   LACTIC ACID, PLASMA     Status: Abnormal   Collection Time   08/07/11  5:30 AM      Component Value Range Comment   Lactic Acid, Venous 5.3 (*) 0.5 - 2.2 mmol/L   AMMONIA     Status: Abnormal   Collection Time   08/07/11  5:30 AM      Component Value Range Comment   Ammonia 126 (*) 11 - 60 umol/L   PREALBUMIN     Status: Abnormal   Collection Time   08/07/11  5:30 AM      Component Value Range Comment   Prealbumin 7.7 (*) 17.0 - 34.0 mg/dL   OSMOLALITY     Status: Abnormal   Collection Time   08/07/11  5:30 AM      Component Value Range Comment   Osmolality 313 (*) 275 - 300 mOsm/kg   PROCALCITONIN     Status: Normal   Collection Time   08/07/11  5:30 AM      Component Value Range Comment   Procalcitonin 0.99     MAGNESIUM     Status: Normal   Collection Time   08/07/11  5:30 AM      Component Value Range Comment   Magnesium 1.9  1.5 - 2.5 mg/dL   BLOOD GAS, ARTERIAL     Status: Abnormal   Collection Time   08/07/11 10:13 AM      Component Value Range Comment   FIO2 1.00      Delivery systems VENTILATOR      Mode PRESSURE REGULATED VOLUME CONTROL      VT 600  Rate 22      Peep/cpap 5.0      pH, Arterial 7.336 (*) 7.350 - 7.450    pCO2 arterial 16.0 (*) 35.0 - 45.0 mmHg    pO2, Arterial 514.0 (*) 80.0 - 100.0 mmHg    Bicarbonate 8.3 (*) 20.0 - 24.0 mEq/L    TCO2 8.8  0 - 100 mmol/L    Acid-base deficit 16.7 (*) 0.0 - 2.0 mmol/L    O2 Saturation 100.0      Patient temperature 98.6      Collection site A-LINE      Drawn by [phone]      Sample type ARTERIAL DRAW      Allens test (pass/fail) PASS  PASS   CULTURE, BLOOD (ROUTINE X 2)     Status: Normal (Preliminary result)   Collection Time    08/07/11 10:55 AM      Component Value Range Comment   Specimen Description BLOOD RIGHT ARM      Special Requests BOTTLES DRAWN AEROBIC AND ANAEROBIC 10CC      Culture  Setup Time 08/07/2011 16:42      Culture        Value:        BLOOD CULTURE RECEIVED NO GROWTH TO DATE CULTURE WILL BE HELD FOR 5 DAYS BEFORE ISSUING A FINAL NEGATIVE REPORT   Report Status PENDING     CULTURE, BLOOD (ROUTINE X 2)     Status: Normal (Preliminary result)   Collection Time   08/07/11 11:00 AM      Component Value Range Comment   Specimen Description BLOOD RIGHT HAND      Special Requests BOTTLES DRAWN AEROBIC AND ANAEROBIC 10CC      Culture  Setup Time 08/07/2011 16:42      Culture        Value:        BLOOD CULTURE RECEIVED NO GROWTH TO DATE CULTURE WILL BE HELD FOR 5 DAYS BEFORE ISSUING A FINAL NEGATIVE REPORT   Report Status PENDING     STREP PNEUMONIAE URINARY ANTIGEN     Status: Normal   Collection Time   08/07/11 11:13 AM      Component Value Range Comment   Strep Pneumo Urinary Antigen NEGATIVE  NEGATIVE   LEGIONELLA ANTIGEN, URINE     Status: Normal   Collection Time   08/07/11 11:13 AM      Component Value Range Comment   Specimen Description URINE, CATHETERIZED      Special Requests NONE      Legionella Antigen, Urine Negative for Legionella pneumophilia serogroup 1      Report Status 08/08/2011 FINAL     LACTIC ACID, PLASMA     Status: Normal   Collection Time   08/07/11 11:55 AM      Component Value Range Comment   Lactic Acid, Venous 0.8  0.5 - 2.2 mmol/L   PROCALCITONIN     Status: Normal   Collection Time   08/07/11 11:55 AM      Component Value Range Comment   Procalcitonin 0.66     CORTISOL     Status: Normal   Collection Time   08/07/11 11:55 AM      Component Value Range Comment   Cortisol, Plasma 17.4     BASIC METABOLIC PANEL     Status: Abnormal   Collection Time   08/07/11 11:55 AM      Component Value Range Comment   Sodium 143  135 - 145  mEq/L    Potassium <2.0 (*) 3.5 - 5.1  mEq/L    Chloride 120 (*) 96 - 112 mEq/L    CO2 8 (*) 19 - 32 mEq/L    Glucose, Bld 120 (*) 70 - 99 mg/dL    BUN 46 (*) 6 - 23 mg/dL    Creatinine, Ser 0.45 (*) 0.50 - 1.35 mg/dL    Calcium 5.3 (*) 8.4 - 10.5 mg/dL    GFR calc non Af Amer 15 (*) >90 mL/min    GFR calc Af Amer 17 (*) >90 mL/min   TYPE AND SCREEN     Status: Normal (Preliminary result)   Collection Time   08/07/11 11:55 AM      Component Value Range Comment   ABO/RH(D) A POS      Antibody Screen NEG      Sample Expiration 08/10/2011      Unit Number [id number]      Blood Component Type RED CELLS,LR      Unit division 00      Status of Unit ISSUED,FINAL      Transfusion Status OK TO TRANSFUSE      Crossmatch Result Compatible      Unit Number [id number]      Blood Component Type RED CELLS,LR      Unit division 00      Status of Unit ALLOCATED      Transfusion Status OK TO TRANSFUSE      Crossmatch Result Compatible      Unit Number [id number]      Blood Component Type RED CELLS,LR      Unit division 00      Status of Unit ALLOCATED      Transfusion Status OK TO TRANSFUSE      Crossmatch Result Compatible     PREPARE RBC (CROSSMATCH)     Status: Normal   Collection Time   08/07/11 11:55 AM      Component Value Range Comment   Order Confirmation ORDER PROCESSED BY BLOOD BANK     BASIC METABOLIC PANEL     Status: Abnormal   Collection Time   08/07/11  5:14 PM      Component Value Range Comment   Sodium 143  135 - 145 mEq/L    Potassium 2.7 (*) 3.5 - 5.1 mEq/L    Chloride 119 (*) 96 - 112 mEq/L    CO2 9 (*) 19 - 32 mEq/L    Glucose, Bld 122 (*) 70 - 99 mg/dL    BUN 45 (*) 6 - 23 mg/dL    Creatinine, Ser 6.29 (*) 0.50 - 1.35 mg/dL    Calcium 5.5 (*) 8.4 - 10.5 mg/dL    GFR calc non Af Amer 15 (*) >90 mL/min    GFR calc Af Amer 17 (*) >90 mL/min   PHOSPHORUS     Status: Abnormal   Collection Time   08/07/11  5:14 PM      Component Value Range Comment   Phosphorus 1.9 (*) 2.3 - 4.6 mg/dL   MAGNESIUM     Status:  Normal   Collection Time   08/07/11  5:14 PM      Component Value Range Comment   Magnesium 1.5  1.5 - 2.5 mg/dL   HEMOGLOBIN AND HEMATOCRIT, BLOOD     Status: Abnormal   Collection Time   08/07/11  6:55 PM      Component Value Range Comment   Hemoglobin 7.8 (*) 13.0 - 17.0 g/dL  HCT 21.3 (*) 39.0 - 52.0 %   BASIC METABOLIC PANEL     Status: Abnormal   Collection Time   08/07/11 11:25 PM      Component Value Range Comment   Sodium 145  135 - 145 mEq/L    Potassium 2.7 (*) 3.5 - 5.1 mEq/L    Chloride 121 (*) 96 - 112 mEq/L    CO2 9 (*) 19 - 32 mEq/L    Glucose, Bld 131 (*) 70 - 99 mg/dL    BUN 43 (*) 6 - 23 mg/dL    Creatinine, Ser 1.61 (*) 0.50 - 1.35 mg/dL    Calcium 5.8 (*) 8.4 - 10.5 mg/dL    GFR calc non Af Amer 16 (*) >90 mL/min    GFR calc Af Amer 19 (*) >90 mL/min   CALCIUM, IONIZED     Status: Abnormal   Collection Time   08/07/11 11:25 PM      Component Value Range Comment   Calcium, Ion 0.93 (*) 1.12 - 1.32 mmol/L   CBC     Status: Abnormal   Collection Time   08/08/11  3:50 AM      Component Value Range Comment   WBC 16.3 (*) 4.0 - 10.5 K/uL    RBC 2.43 (*) 4.22 - 5.81 MIL/uL    Hemoglobin 8.1 (*) 13.0 - 17.0 g/dL    HCT 09.6 (*) 04.5 - 52.0 %    MCV 90.9  78.0 - 100.0 fL    MCH 33.3  26.0 - 34.0 pg    MCHC 36.7 (*) 30.0 - 36.0 g/dL    RDW 40.9 (*) 81.1 - 15.5 %    Platelets 61 (*) 150 - 400 K/uL CONSISTENT WITH PREVIOUS RESULT  COMPREHENSIVE METABOLIC PANEL     Status: Abnormal   Collection Time   08/08/11  3:50 AM      Component Value Range Comment   Sodium 146 (*) 135 - 145 mEq/L    Potassium 2.7 (*) 3.5 - 5.1 mEq/L    Chloride 121 (*) 96 - 112 mEq/L    CO2 10 (*) 19 - 32 mEq/L    Glucose, Bld 143 (*) 70 - 99 mg/dL    BUN 43 (*) 6 - 23 mg/dL    Creatinine, Ser 9.14 (*) 0.50 - 1.35 mg/dL    Calcium 5.7 (*) 8.4 - 10.5 mg/dL    Total Protein 4.3 (*) 6.0 - 8.3 g/dL    Albumin 1.1 (*) 3.5 - 5.2 g/dL    AST 42 (*) 0 - 37 U/L    ALT 27  0 - 53 U/L    Alkaline  Phosphatase 76  39 - 117 U/L    Total Bilirubin 0.7  0.3 - 1.2 mg/dL    GFR calc non Af Amer 16 (*) >90 mL/min    GFR calc Af Amer 18 (*) >90 mL/min   PROCALCITONIN     Status: Normal   Collection Time   08/08/11  3:50 AM      Component Value Range Comment   Procalcitonin 0.66     MAGNESIUM     Status: Abnormal   Collection Time   08/08/11  3:50 AM      Component Value Range Comment   Magnesium 1.4 (*) 1.5 - 2.5 mg/dL   PHOSPHORUS     Status: Abnormal   Collection Time   08/08/11  3:50 AM      Component Value Range Comment   Phosphorus 1.4 (*)  2.3 - 4.6 mg/dL   SODIUM, URINE, RANDOM     Status: Normal   Collection Time   08/08/11  3:50 AM      Component Value Range Comment   Sodium, Ur 13     CREATININE, URINE, RANDOM     Status: Normal   Collection Time   08/08/11  3:50 AM      Component Value Range Comment   Creatinine, Urine 136.34     BLOOD GAS, ARTERIAL     Status: Abnormal   Collection Time   08/08/11  4:00 AM      Component Value Range Comment   FIO2 0.30      Mode PRESSURE REGULATED VOLUME CONTROL      VT 600      Rate 22      Peep/cpap 5.0      pH, Arterial 7.379  7.350 - 7.450    pCO2 arterial 18.6 (*) 35.0 - 45.0 mmHg RBV CALLED RESULTS TO RN STEPANIE BELL@ [id number] ON [id number] BY PPALMER BSRT,RCP   pO2, Arterial 166.0 (*) 80.0 - 100.0 mmHg    Bicarbonate 10.7 (*) 20.0 - 24.0 mEq/L    TCO2 11.3  0 - 100 mmol/L    Acid-base deficit 13.5 (*) 0.0 - 2.0 mmol/L    O2 Saturation 99.8      Patient temperature 98.6     BASIC METABOLIC PANEL     Status: Abnormal   Collection Time   08/08/11  9:00 AM      Component Value Range Comment   Sodium 143  135 - 145 mEq/L    Potassium 2.7 (*) 3.5 - 5.1 mEq/L    Chloride 118 (*) 96 - 112 mEq/L    CO2 11 (*) 19 - 32 mEq/L    Glucose, Bld 233 (*) 70 - 99 mg/dL    BUN 42 (*) 6 - 23 mg/dL    Creatinine, Ser 0.10 (*) 0.50 - 1.35 mg/dL    Calcium 5.7 (*) 8.4 - 10.5 mg/dL    GFR calc non Af Amer 16 (*) >90 mL/min    GFR calc Af Amer 19  (*) >90 mL/min     Ct Head Wo Contrast  08/07/2011  *RADIOLOGY REPORT*  Clinical Data: Altered mental status  CT HEAD WITHOUT CONTRAST  Technique:  Contiguous axial images were obtained from the base of the skull through the vertex without contrast.  Comparison: 04/14/2009  Findings: Postoperative changes from left frontal craniotomy and dural scarring are stable.  Encephalomalacia in the inferior left temporal lobe is stable.  There is a new complex right-sided subdural fluid collection with both fluid density and hyperdense elements.  This would raise suspicion for acute or subacute hemorrhage within a chronic subdural fluid collection.  There is 5 mm midline shift to the left.  Some of this is due to the subdural hematoma and also due to the patient's cranial position.  No evidence of subfalcine herniation.  The subdural complex fluid collection and/or hematoma measures 4 mm in thickness.  There is probably small amount of extra-axial blood over the right frontal lobe as well.  IMPRESSION: Right supratentorial subdural hematoma containing both acute and chronic elements.  Original Report Authenticated By: Donavan Burnet, M.D.   US Renal Port  08/07/2011  *RADIOLOGY REPORT*  Clinical Data: History of elevation of BUN and creatinine level. History of chronic renal disease.  History of hypertension. There is history of renal arterial stenoses.  RENAL/URINARY TRACT ULTRASOUND COMPLETE  Comparison:  CT 08/30/2006 study.  Findings:  Right Kidney:  Right renal length is 10.5 cm.  There is increased echogenicity of the renal parenchyma.  No hydronephrosis is evident.  No renal cyst or mass is evident.  No calculus or focal parenchymal loss is seen.  Left Kidney:  Left renal length as 11.1 cm.  There is increased echogenicity of renal parenchyma.  No hydronephrosis is evident. Small renal cyst in the lower pole measures 1.0 x 0.8 x 0.6 cm. The cyst appears simple.  No solid mass, calculus, or focal parenchymal loss  is seen.  Bladder:  Urinary bladder is decompressed by Foley catheter.  Incidental note is made of ascites.  IMPRESSION: Increased echogenicity of renal parenchyma consistent with chronic medical renal disease.  No hydronephrosis evident.  Small simple appearing left renal cyst.  Ascites.  Original Report Authenticated By: Crawford Givens, M.D.   Dg Chest Port 1 View  08/08/2011  *RADIOLOGY REPORT*  Clinical Data: Intubated patient.  PORTABLE CHEST - 1 VIEW  Comparison: Chest 08/07/2011.  Findings: Endotracheal tube remains in place in good position with tip at the clavicular heads.  Right IJ catheter is also in good position.  New feeding tube has its tip in the stomach.  There is basilar atelectasis, increased since the prior study and worse on the left.  No pneumothorax.  Heart size normal.  IMPRESSION:  1.  Feeding tube tip is in the stomach. 2.  Increased basilar atelectasis, worse on the left.  Original Report Authenticated By: Bernadene Bell. Maricela Curet, M.D.   Dg Chest Port 1 View  08/07/2011  *RADIOLOGY REPORT*  Clinical Data: Intubated.  Central line placement.  PORTABLE CHEST - 1 VIEW  Comparison: 08/28/2006.  Findings: Stable right jugular catheter with its tip in the superior vena cava.  Interval endotracheal tube with its tip in satisfactory position 4 cm above the carina.  Interval minimal linear density at the left lung base.  Otherwise, clear lungs. Normal sized heart.  Unremarkable bones.  IMPRESSION:  1.  Interval minimal linear atelectasis at the left lung base. 2.  A double density is again demonstrated at the aortic arch. Again, follow-up PA and lateral views would be helpful when possible.  Original Report Authenticated By: Darrol Angel, M.D.   Dg Abd Portable 1v  08/07/2011  *RADIOLOGY REPORT*  Clinical Data: Panda placement  PORTABLE ABDOMEN - 1 VIEW  Comparison: None.  Findings: Panda tube is coiled in the stomach near the fundus. Nonspecific gas pattern.  IMPRESSION: As above.  Original  Report Authenticated By: Elsie Stain, M.D.   Dg Abd Portable 1v  08/07/2011  *RADIOLOGY REPORT*  Clinical Data: Feeding tube placement.  PORTABLE ABDOMEN - 1 VIEW 08/07/2011 1455 hours:  Comparison: CTA abdomen 08/30/2006.  Findings: Feeding tube looped in the stomach, coursing back up into the esophagus, with its tip in the distal esophagus.  Bowel gas pattern unremarkable.  Iliofemoral atherosclerosis.  IMPRESSION:  1.  Feeding tube looped in the stomach, coursing back up into the esophagus, with its tip in the distal esophagus.  This will need to be withdrawn several centimeters and re-advanced. 2.  No acute abdominal abnormality.  These results will be called to the ordering clinician or representative by the Radiologist Assistant, and communication documented in the PACS Dashboard.  Original Report Authenticated By: Arnell Sieving, M.D.    @ROS @ Blood pressure 94/67, pulse 80, temperature 97.5 F (36.4 C), temperature source Axillary, resp. rate 21, height 5\' 11"  (  1.803 m), weight 82.1 kg (181 lb), SpO2 100.00%. @[id number] @ Physical Examination: General appearance - sedated on vent,  Eyes - pupils equal and reactive, extraocular eye movements intact, cannot see fundi Mouth - mucous membranes moist, pharynx normal without lesions Neck - adenopathy noted PCL Lymphatics - posterior cervical nodes Chest - rhonchi noted bilat Heart - normal rate, regular rhythm, normal S1, S2, no murmurs, rubs, clicks or gallops Abdomen - bs decreased, soft, liver down4 cm Extremities - peripheral pulses normal, no pedal edema, no clubbing or cyanosis, pedal edema 1 + Skin - stasis changes LE  Assessment/Plan: 1 CKD/AKI HTn vs DM etiology but not signif proteinuria, ? Could be Hep related.  Now mildly worse than baseline. Acidemic with mild AG.  Suspect hemodynamic worsening.  Needs CRRT.Marland Kitchen U/S c/w chronic dz, Urine not inflam 2 Chronic illness unexplained low alb, low ptlt, but PT ok.  ?? Severe  liver dz. 3 Hypertension:  Not an issue 4. Anemia will address  5. 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 Shunt with liver dz, ? Unexplained infx 7 Gout 8 Low ptlt 9Hep C P CRRT, Fe, PTH,   Retta Pitcher L 08/08/2011, 4:06 PM

## 2011-08-08 NOTE — Progress Notes (Addendum)
ANTIBIOTIC CONSULT NOTE - INITIAL  Pharmacy Consult for Vancomycin + Zosyn Indication: Empiric pneumonia coverage  Allergies not on file  Patient Measurements: Height: 5\' 11"  (180.3 cm) Weight: 181 lb (82.1 kg) IBW/kg (Calculated) : 75.3   Vital Signs: Temp: 97.5 F (36.4 C) (07/10 1200) Temp src: Axillary (07/10 1200) BP: 94/67 mmHg (07/10 1300) Pulse Rate: 80  (07/10 1300) Intake/Output from previous day: 07/09 0701 - 07/10 0700 In: 6893.6 [I.V.:5253.1; Blood:362.5; NG/GT:150; IV Piggyback:1128] Out: 440 [Urine:440] Intake/Output from this shift: Total I/O In: 820 [I.V.:820] Out: 40 [Urine:40]  Labs:  [facility] 08/08/11 0900 08/08/11 0350 08/07/11 2325 08/07/11 1855 08/07/11 0530  WBC -- 16.3* -- -- 9.6  HGB -- 8.1* -- 7.8* 7.5*  PLT -- 61* -- -- 60*  LABCREA -- 136.34 -- -- --  CREATININE 3.88* 3.95* 3.88* -- --   Estimated Creatinine Clearance: 22.9 ml/min (by C-G formula based on Cr of 3.88). No results found for this basename: VANCOTROUGH:2,VANCOPEAK:2,VANCORANDOM:2,GENTTROUGH:2,GENTPEAK:2,GENTRANDOM:2,TOBRATROUGH:2,TOBRAPEAK:2,TOBRARND:2,AMIKACINPEAK:2,AMIKACINTROU:2,AMIKACIN:2, in the last 72 hours   Microbiology: Recent Results (from the past 720 hour(s))  MRSA PCR SCREENING     Status: Normal   Collection Time   08/07/11  4:28 AM      Component Value Range Status Comment   MRSA by PCR NEGATIVE  NEGATIVE Final   CULTURE, BLOOD (ROUTINE X 2)     Status: Normal (Preliminary result)   Collection Time   08/07/11 10:55 AM      Component Value Range Status Comment   Specimen Description BLOOD RIGHT ARM   Final    Special Requests BOTTLES DRAWN AEROBIC AND ANAEROBIC 10CC   Final    Culture  Setup Time 08/07/2011 16:42   Final    Culture     Final    Value:        BLOOD CULTURE RECEIVED NO GROWTH TO DATE CULTURE WILL BE HELD FOR 5 DAYS BEFORE ISSUING A FINAL NEGATIVE REPORT   Report Status PENDING   Incomplete   CULTURE, BLOOD (ROUTINE X 2)     Status: Normal  (Preliminary result)   Collection Time   08/07/11 11:00 AM      Component Value Range Status Comment   Specimen Description BLOOD RIGHT HAND   Final    Special Requests BOTTLES DRAWN AEROBIC AND ANAEROBIC 10CC   Final    Culture  Setup Time 08/07/2011 16:42   Final    Culture     Final    Value:        BLOOD CULTURE RECEIVED NO GROWTH TO DATE CULTURE WILL BE HELD FOR 5 DAYS BEFORE ISSUING A FINAL NEGATIVE REPORT   Report Status PENDING   Incomplete     Medical History: Past Medical History  Diagnosis Date  . HTN (hypertension), malignant   . CKD (chronic kidney disease), stage III   . Renal artery stenosis     moderate  . Meningioma     s/p resection  . Seizure disorder   . Alcohol abuse     hx  . TIA (transient ischemic attack)   . Diastolic CHF, acute     acute?; echo 7/10 with EF 65%, moderate LVH  . HCV (hepatitis C virus)    Assessment: 56 y.o. M transferred from [facility] with AMS and seizures activity on 7/9 and found to have R-SDH. Pharmacy now consulted to start Vancomycin + Zosyn for empiric PNA coverage. The patient is noted to have CKD at baseline but is now in acute renal failure. Wt: 82.1,  SCr 3.88, CrCl~20 ml/min however UOP only 0.2 ml/kg/hr. Will dose conservatively given poor urine output.  Goal of Therapy:  Vancomycin trough level 15-20 mcg/ml  Plan:  1. Vancomycin 1500 mg IV x 1 dose now 2. Vancomycin 1000 mg IV every 48 hours 3. Zosyn 2.275 g IV every 8 hours 4. Will continue to follow renal function, culture results, LOT, and antibiotic de-escalation plans   Georgina Pillion, PharmD, BCPS Clinical Pharmacist Pager: [phone] 08/08/2011 3:54 PM

## 2011-08-09 ENCOUNTER — Inpatient Hospital Stay (HOSPITAL_COMMUNITY): Payer: Medicare Other

## 2011-08-09 DIAGNOSIS — I6200 Nontraumatic subdural hemorrhage, unspecified: Principal | ICD-10-CM

## 2011-08-09 DIAGNOSIS — R579 Shock, unspecified: Secondary | ICD-10-CM

## 2011-08-09 DIAGNOSIS — E872 Acidosis: Secondary | ICD-10-CM

## 2011-08-09 DIAGNOSIS — G9340 Encephalopathy, unspecified: Secondary | ICD-10-CM

## 2011-08-09 DIAGNOSIS — J9600 Acute respiratory failure, unspecified whether with hypoxia or hypercapnia: Secondary | ICD-10-CM

## 2011-08-09 DIAGNOSIS — D696 Thrombocytopenia, unspecified: Secondary | ICD-10-CM

## 2011-08-09 DIAGNOSIS — R4182 Altered mental status, unspecified: Secondary | ICD-10-CM

## 2011-08-09 DIAGNOSIS — N179 Acute kidney failure, unspecified: Secondary | ICD-10-CM

## 2011-08-09 DIAGNOSIS — R569 Unspecified convulsions: Secondary | ICD-10-CM

## 2011-08-09 LAB — IRON AND TIBC
Iron: <10 ug/dL — ABNORMAL LOW (ref 42–135)
UIBC: 61 ug/dL — ABNORMAL LOW (ref 125–400)

## 2011-08-09 LAB — POCT I-STAT 3, ART BLOOD GAS (G3+)
Bicarbonate: 20.3 mEq/L (ref 20.0–24.0)
Patient temperature: 34
TCO2: 21 mmol/L (ref 0–100)
pCO2 arterial: 23.3 mmHg — ABNORMAL LOW (ref 35.0–45.0)
pH, Arterial: 7.536 — ABNORMAL HIGH (ref 7.350–7.450)
pO2, Arterial: 125 mmHg — ABNORMAL HIGH (ref 80.0–100.0)

## 2011-08-09 LAB — BASIC METABOLIC PANEL
CO2: 20 mEq/L (ref 19–32)
CO2: 24 mEq/L (ref 19–32)
Calcium: 6.5 mg/dL — ABNORMAL LOW (ref 8.4–10.5)
Chloride: 110 mEq/L (ref 96–112)
Chloride: 109 mEq/L (ref 96–112)
Creatinine, Ser: 1.63 mg/dL — ABNORMAL HIGH (ref 0.50–1.35)
GFR calc Af Amer: 40 mL/min — ABNORMAL LOW (ref >90)
GFR calc Af Amer: 53 mL/min — ABNORMAL LOW (ref >90)
GFR calc Af Amer: 59 mL/min — ABNORMAL LOW (ref >90)
GFR calc non Af Amer: 46 mL/min — ABNORMAL LOW (ref >90)
Potassium: 3.2 mEq/L — ABNORMAL LOW (ref 3.5–5.1)
Sodium: 140 mEq/L (ref 135–145)
Sodium: 137 mEq/L (ref 135–145)

## 2011-08-09 LAB — POCT ACTIVATED CLOTTING TIME
Activated Clotting Time: 149 seconds
Activated Clotting Time: 155 seconds
Activated Clotting Time: 170 seconds
Activated Clotting Time: 189 seconds
Activated Clotting Time: 194 seconds

## 2011-08-09 LAB — CBC
MCH: 33.1 pg (ref 26.0–34.0)
Platelets: 27 10*3/uL — CL (ref 150–400)
RBC: 2.63 MIL/uL — ABNORMAL LOW (ref 4.22–5.81)

## 2011-08-09 LAB — PROTIME-INR: INR: 1.3 (ref 0.00–1.49)

## 2011-08-09 LAB — DIFFERENTIAL
Basophils Absolute: 0 10*3/uL (ref 0.0–0.1)
Basophils Relative: 0 % (ref 0–1)
Eosinophils Absolute: 0 10*3/uL (ref 0.0–0.7)
Eosinophils Relative: 0 % (ref 0–5)
Monocytes Absolute: 1 10*3/uL (ref 0.1–1.0)
Neutro Abs: 17.9 10*3/uL — ABNORMAL HIGH (ref 1.7–7.7)

## 2011-08-09 LAB — DIC (DISSEMINATED INTRAVASCULAR COAGULATION)PANEL
Fibrinogen: 260 mg/dL (ref 204–475)
Platelets: 23 10*3/uL — CL (ref 150–400)
aPTT: 188 seconds — ABNORMAL HIGH (ref 24–37)

## 2011-08-09 LAB — COMPREHENSIVE METABOLIC PANEL
AST: 32 U/L (ref 0–37)
Albumin: 1.1 g/dL — ABNORMAL LOW (ref 3.5–5.2)
Calcium: 5.8 mg/dL — CL (ref 8.4–10.5)
Chloride: 111 mEq/L (ref 96–112)
Creatinine, Ser: 2.43 mg/dL — ABNORMAL HIGH (ref 0.50–1.35)
Total Protein: 4.6 g/dL — ABNORMAL LOW (ref 6.0–8.3)

## 2011-08-09 LAB — RETICULOCYTES
RBC.: 2.63 MIL/uL — ABNORMAL LOW (ref 4.22–5.81)
Retic Ct Pct: 4.1 % — ABNORMAL HIGH (ref 0.4–3.1)

## 2011-08-09 LAB — MAGNESIUM: Magnesium: 1.7 mg/dL (ref 1.5–2.5)

## 2011-08-09 MED ORDER — POTASSIUM CHLORIDE 10 MEQ/50ML IV SOLN
10.0000 meq | INTRAVENOUS | Status: AC
  Administered 2011-08-09 (×5): 10 meq via INTRAVENOUS
  Filled 2011-08-09: qty 50
  Filled 2011-08-09: qty 100
  Filled 2011-08-09: qty 50

## 2011-08-09 MED ORDER — VANCOMYCIN HCL IN DEXTROSE 1-5 GM/200ML-% IV SOLN
1000.0000 mg | INTRAVENOUS | Status: DC
  Administered 2011-08-09 – 2011-08-12 (×4): 1000 mg via INTRAVENOUS
  Filled 2011-08-09 (×5): qty 200

## 2011-08-09 MED ORDER — FENTANYL CITRATE 0.05 MG/ML IJ SOLN
INTRAMUSCULAR | Status: AC
  Filled 2011-08-09: qty 2

## 2011-08-09 MED ORDER — SODIUM CHLORIDE 0.9 % IV BOLUS (SEPSIS)
1000.0000 mL | Freq: Once | INTRAVENOUS | Status: AC
  Administered 2011-08-09: 1000 mL via INTRAVENOUS

## 2011-08-09 MED ORDER — PIPERACILLIN-TAZOBACTAM 3.375 G IVPB
3.3750 g | Freq: Four times a day (QID) | INTRAVENOUS | Status: DC
  Administered 2011-08-09 – 2011-08-10 (×3): 3.375 g via INTRAVENOUS
  Filled 2011-08-09 (×5): qty 50

## 2011-08-09 MED ORDER — POTASSIUM CHLORIDE 20 MEQ/15ML (10%) PO LIQD
ORAL | Status: AC
  Filled 2011-08-09: qty 30

## 2011-08-09 MED ORDER — PIPERACILLIN-TAZOBACTAM 3.375 G IVPB
3.3750 g | Freq: Four times a day (QID) | INTRAVENOUS | Status: DC
  Administered 2011-08-09: 3.375 g via INTRAVENOUS
  Filled 2011-08-09 (×4): qty 50

## 2011-08-09 MED ORDER — POTASSIUM CHLORIDE 20 MEQ/15ML (10%) PO LIQD
40.0000 meq | Freq: Once | ORAL | Status: AC
  Administered 2011-08-09: 40 meq
  Filled 2011-08-09: qty 30

## 2011-08-09 MED ORDER — MIDAZOLAM HCL 2 MG/2ML IJ SOLN
2.0000 mg | INTRAMUSCULAR | Status: DC | PRN
  Administered 2011-08-09 – 2011-08-10 (×4): 2 mg via INTRAVENOUS
  Administered 2011-08-10: 4 mg via INTRAVENOUS
  Administered 2011-08-11 – 2011-08-13 (×8): 2 mg via INTRAVENOUS
  Filled 2011-08-09: qty 4
  Filled 2011-08-09 (×12): qty 2

## 2011-08-09 MED ORDER — FENTANYL CITRATE 0.05 MG/ML IJ SOLN
50.0000 ug | INTRAMUSCULAR | Status: DC | PRN
  Administered 2011-08-09: 50 ug via INTRAVENOUS
  Administered 2011-08-10 – 2011-08-13 (×8): 100 ug via INTRAVENOUS
  Filled 2011-08-09 (×9): qty 2

## 2011-08-09 MED ORDER — SODIUM CHLORIDE 0.9 % IV SOLN
1020.0000 mg | Freq: Once | INTRAVENOUS | Status: AC
  Administered 2011-08-09: 1020 mg via INTRAVENOUS
  Filled 2011-08-09: qty 34

## 2011-08-09 NOTE — Progress Notes (Signed)
Inpatient Diabetes Program Recommendations  AACE/ADA: New Consensus Statement on Inpatient Glycemic Control (2009)  Target Ranges:  Prepandial:   less than 140 mg/dL      Peak postprandial:   less than 180 mg/dL (1-2 hours)      Critically ill patients:  140 - 180 mg/dL   Reason for Visit: Elevated lab glucose  Inpatient Diabetes Program Recommendations Correction (SSI): noted lab glucose values elevated.  Pt on tube feeds.  Please check cbg's q 4 hrs and use correction as needed.  Note: Thank you, Lenor Coffin, RN, CNS, Diabetes Coordinator [phone])

## 2011-08-09 NOTE — Progress Notes (Signed)
CRITICAL VALUE ALERT  Critical value received:  PTT>200  Date of notification:  08/09/11  Time of notification:  0958  Critical value read back:yes  Nurse who received alert:  Redell Bhandari Illene Regulus  MD notified (1st page):Byrum   Responding MD:  Delton Coombes

## 2011-08-09 NOTE — Progress Notes (Signed)
Subjective: Interval History: none.  Objective: Vital signs in last 24 hours: Temp:  [93 F (33.9 C)-98.6 F (37 C)] 93.2 F (34 C) (07/11 0800) Pulse Rate:  [60-81] 64  (07/11 0800) Resp:  [17-31] 24  (07/11 0800) BP: (62-109)/(52-84) 86/58 mmHg (07/11 0759) SpO2:  [100 %] 100 % (07/11 0800) Arterial Line BP: (97-116)/(59-77) 103/65 mmHg (07/10 1900) FiO2 (%):  [30 %-30.4 %] 30 % (07/11 0800) Weight:  [83.3 kg (183 lb 10.3 oz)] 83.3 kg (183 lb 10.3 oz) (07/11 0500) Weight change: 1.2 kg (2 lb 10.3 oz)  Intake/Output from previous day: 07/10 0701 - 07/11 0700 In: 4587.9 [I.V.:3668.9; NG/GT:480; IV Piggyback:409] Out: 2833 [Urine:133] Intake/Output this shift: Total I/O In: 329.6 [I.V.:65.6; NG/GT:80; IV Piggyback:184] Out: 193 [Other:193]  General appearance: sedated, on vent Resp: rales bilaterally and rhonchi bilaterally Cardio: S1, S2 normal GI: few bs, liver down 6 cm, mod distension Extremities: edema 3+, Fem cath F Skin: pale  Lab Results:  [facility] 08/09/11 0310 08/08/11 0350  WBC 19.7* 16.3*  HGB 8.7* 8.1*  HCT 23.8* 22.1*  PLT 27* 61*   BMET:  Basename 08/09/11 0310 08/08/11 2115  NA 142 143  K 2.7* 2.5*  CL 111 118*  CO2 19 11*  GLUCOSE 199* 162*  BUN 27* 41*  CREATININE 2.43* 3.81*  CALCIUM 5.8* 5.6*   No results found for this basename: PTH:2 in the last 72 hours Iron Studies:  Basename 08/08/11 2115  IRON <10*  TIBC Not calculated due to Iron <10.  TRANSFERRIN --  FERRITIN --    Studies/Results: Ct Head Wo Contrast  08/07/2011  *RADIOLOGY REPORT*  Clinical Data: Altered mental status  CT HEAD WITHOUT CONTRAST  Technique:  Contiguous axial images were obtained from the base of the skull through the vertex without contrast.  Comparison: 04/14/2009  Findings: Postoperative changes from left frontal craniotomy and dural scarring are stable.  Encephalomalacia in the inferior left temporal lobe is stable.  There is a new complex right-sided  subdural fluid collection with both fluid density and hyperdense elements.  This would raise suspicion for acute or subacute hemorrhage within a chronic subdural fluid collection.  There is 5 mm midline shift to the left.  Some of this is due to the subdural hematoma and also due to the patient's cranial position.  No evidence of subfalcine herniation.  The subdural complex fluid collection and/or hematoma measures 4 mm in thickness.  There is probably small amount of extra-axial blood over the right frontal lobe as well.  IMPRESSION: Right supratentorial subdural hematoma containing both acute and chronic elements.  Original Report Authenticated By: Donavan Burnet, M.D.   US Renal Port  08/07/2011  *RADIOLOGY REPORT*  Clinical Data: History of elevation of BUN and creatinine level. History of chronic renal disease.  History of hypertension. There is history of renal arterial stenoses.  RENAL/URINARY TRACT ULTRASOUND COMPLETE  Comparison:  CT 08/30/2006 study.  Findings:  Right Kidney:  Right renal length is 10.5 cm.  There is increased echogenicity of the renal parenchyma.  No hydronephrosis is evident.  No renal cyst or mass is evident.  No calculus or focal parenchymal loss is seen.  Left Kidney:  Left renal length as 11.1 cm.  There is increased echogenicity of renal parenchyma.  No hydronephrosis is evident. Small renal cyst in the lower pole measures 1.0 x 0.8 x 0.6 cm. The cyst appears simple.  No solid mass, calculus, or focal parenchymal loss is seen.  Bladder:  Urinary bladder is decompressed by Foley catheter.  Incidental note is made of ascites.  IMPRESSION: Increased echogenicity of renal parenchyma consistent with chronic medical renal disease.  No hydronephrosis evident.  Small simple appearing left renal cyst.  Ascites.  Original Report Authenticated By: Crawford Givens, M.D.   Dg Chest Port 1 View  08/09/2011  *RADIOLOGY REPORT*  Clinical Data: To evaluate endotracheal tube  PORTABLE CHEST - 1 VIEW   Comparison: 08/08/2011.  Findings: Endotracheal tube tip 3.9 cm above the carina.  Right central line tip mid superior vena cava level.  Left Swan-Ganz catheter is in place with the tip projecting at the level of the distal aspect of the right main pulmonary artery/right lower lobe pulmonary artery junction.  No gross pneumothorax.  Cardiomegaly.  Tortuous aorta.  Central pulmonary vascular prominence.  Left base subsegmental atelectasis.  IMPRESSION: Endotracheal tube tip 3.9 cm above the carina.  Right central line tip mid superior vena cava level.  Left Swan-Ganz catheter is in place with the tip projecting at the level of the distal aspect of the right main pulmonary artery/right lower lobe pulmonary artery junction.  No gross pneumothorax.  Cardiomegaly.  Tortuous aorta.  Central pulmonary vascular prominence.  Left base subsegmental atelectasis.  Original Report Authenticated By: Fuller Canada, M.D.   Dg Chest Port 1 View  08/08/2011  *RADIOLOGY REPORT*  Clinical Data: Swan-Ganz placement  PORTABLE CHEST - 1 VIEW  Comparison: Same day  Findings: Endotracheal tube has its tip 2 cm above the carina. Soft feeding tube enters the abdomen.  Swan-Ganz catheter has its tip in the right main pulmonary artery.  Right internal jugular central line has its tip at the SVC/RA junction.  No pneumothorax. There is mild basilar atelectasis.  IMPRESSION: Lines and tubes well positioned.  Swan-Ganz catheter tip in the right main pulmonary artery, directed towards the right middle or lower lobe branch.  Original Report Authenticated By: Thomasenia Sales, M.D.   Dg Chest Port 1 View  08/08/2011  *RADIOLOGY REPORT*  Clinical Data: Intubated patient.  PORTABLE CHEST - 1 VIEW  Comparison: Chest 08/07/2011.  Findings: Endotracheal tube remains in place in good position with tip at the clavicular heads.  Right IJ catheter is also in good position.  New feeding tube has its tip in the stomach.  There is basilar atelectasis,  increased since the prior study and worse on the left.  No pneumothorax.  Heart size normal.  IMPRESSION:  1.  Feeding tube tip is in the stomach. 2.  Increased basilar atelectasis, worse on the left.  Original Report Authenticated By: Bernadene Bell. Maricela Curet, M.D.   Dg Chest Port 1 View  08/07/2011  *RADIOLOGY REPORT*  Clinical Data: Intubated.  Central line placement.  PORTABLE CHEST - 1 VIEW  Comparison: 08/28/2006.  Findings: Stable right jugular catheter with its tip in the superior vena cava.  Interval endotracheal tube with its tip in satisfactory position 4 cm above the carina.  Interval minimal linear density at the left lung base.  Otherwise, clear lungs. Normal sized heart.  Unremarkable bones.  IMPRESSION:  1.  Interval minimal linear atelectasis at the left lung base. 2.  A double density is again demonstrated at the aortic arch. Again, follow-up PA and lateral views would be helpful when possible.  Original Report Authenticated By: Darrol Angel, M.D.   Dg Abd Portable 1v  08/07/2011  *RADIOLOGY REPORT*  Clinical Data: Panda placement  PORTABLE ABDOMEN - 1 VIEW  Comparison: None.  Findings: Panda tube is coiled in the stomach near the fundus. Nonspecific gas pattern.  IMPRESSION: As above.  Original Report Authenticated By: Elsie Stain, M.D.   Dg Abd Portable 1v  08/07/2011  *RADIOLOGY REPORT*  Clinical Data: Feeding tube placement.  PORTABLE ABDOMEN - 1 VIEW 08/07/2011 1455 hours:  Comparison: CTA abdomen 08/30/2006.  Findings: Feeding tube looped in the stomach, coursing back up into the esophagus, with its tip in the distal esophagus.  Bowel gas pattern unremarkable.  Iliofemoral atherosclerosis.  IMPRESSION:  1.  Feeding tube looped in the stomach, coursing back up into the esophagus, with its tip in the distal esophagus.  This will need to be withdrawn several centimeters and re-advanced. 2.  No acute abdominal abnormality.  These results will be called to the ordering clinician or  representative by the Radiologist Assistant, and communication documented in the PACS Dashboard.  Original Report Authenticated By: Arnell Sieving, M.D.    I have reviewed the patient's current medications.  Assessment/Plan: 1 AKI/CKD acid base and solute better.  BP marginal and vol xs, keep even.  2 Low ptlt ? DIC, ?TTP, check labs, R/o HIT 3 Low alb, chronic illness 4 Hep C 5 Schock R/O infx 6 Sz disorder 7 Anemia given Fe  P CVVHD, Fe. Check hemolysis and fibrinolysis labs    LOS: 2 days   [name] 08/09/2011,8:41 AM

## 2011-08-09 NOTE — Progress Notes (Addendum)
ANTIBIOTIC CONSULT NOTE - FOLLOW UP  Pharmacy Consult for Vancomycin, Zosyn Indication: Empiric coverage for PNA  No Known Allergies  Patient Measurements: Height: 5\' 11"  (180.3 cm) Weight: 183 lb 10.3 oz (83.3 kg) IBW/kg (Calculated) : 75.3   Vital Signs: Temp: 93.6 F (34.2 C) (07/11 1300) Temp src: Core (Comment) (07/11 1300) BP: 97/79 mmHg (07/11 1200) Pulse Rate: 65  (07/11 1300) Intake/Output from previous day: 07/10 0701 - 07/11 0700 In: 4587.9 [I.V.:3668.9; NG/GT:480; IV Piggyback:409] Out: 2833 [Urine:133] Intake/Output from this shift: Total I/O In: 2129.8 [I.V.:425.8; NG/GT:320; IV Piggyback:1384] Out: 1388 [Other:1388]  Labs:  [facility] 08/09/11 1102 08/09/11 0856 08/09/11 0310 08/08/11 2115 08/08/11 0350 08/07/11 1855 08/07/11 0530  WBC -- -- 19.7* -- 16.3* -- 9.6  HGB -- -- 8.7* -- 8.1* 7.8* --  PLT 23* -- 27* -- 61* -- --  LABCREA -- -- -- -- 136.34 -- --  CREATININE -- 2.07* 2.43* 3.81* -- -- --   Estimated Creatinine Clearance: 42.9 ml/min (by C-G formula based on Cr of 2.07). No results found for this basename: VANCOTROUGH:2,VANCOPEAK:2,VANCORANDOM:2,GENTTROUGH:2,GENTPEAK:2,GENTRANDOM:2,TOBRATROUGH:2,TOBRAPEAK:2,TOBRARND:2,AMIKACINPEAK:2,AMIKACINTROU:2,AMIKACIN:2, in the last 72 hours   Assessment: 56 y.o. [name] transferred from [facility] with AMS and seizures activity on 7/9 and found to have R-SDH. Pt continues Vancomycin + Zosyn for empiric PNA coverage.   The patient is noted to have CKD at baseline but is now in acute renal failure, noted CRRT ongoing, started last PM.  WBC increased. Noted hypothermia, but this is likely d/t cooling from CRRT.  Goal of Therapy:  Vancomycin trough level 15-20 mcg/ml  Plan:  - Change Vancomycin to 1000 mg IV every 24 h - Change Zosyn to 3.375g IV every 6 h - f/up cx/sens - f/up CRRT duration  Khaleelah Yowell K. Allena Katz, PharmD, BCPS.  Clinical Pharmacist Pager [phone]. 08/09/2011 1:17 PM

## 2011-08-09 NOTE — Progress Notes (Signed)
Pt needs bite block placed before sux, he will bite down and block the tube and cause vent to alarm.  Does not tolerate bite block well, but used only when suxs.

## 2011-08-09 NOTE — Progress Notes (Signed)
CRITICAL VALUE ALERT  Critical value received:  K+= 2.7; Ca+= 5.8  Date of notification:  08/09/2011  Time of notification: 0537  Critical value read back: yes  Nurse who received alert:  Berdine Dance  MD notified (1st page):  Craige Cotta  Time of first page:  0539  MD notified (2nd page):  Time of second page:  Responding MD: Craige Cotta  Time MD responded:  [phone]

## 2011-08-09 NOTE — Progress Notes (Signed)
CRITICAL VALUE ALERT  Critical value received:  Calcium 6.2   Date of notification:  08/09/11  Time of notification:  0940  Critical value read back:yes  Nurse who received alert:  Yenny Kosa Illene Regulus   MD notified (1st page):  byrum

## 2011-08-09 NOTE — Progress Notes (Signed)
Bite block placed due to biting of tube, no new position of tube at this time bc of the difficulty of placing bite block.

## 2011-08-09 NOTE — Progress Notes (Signed)
Subjective: Patient reports Remains intubated and sedated  Objective: Vital signs in last 24 hours: Temp:  [93 F (33.9 C)-97.9 F (36.6 C)] 95 F (35 C) (07/11 1745) Pulse Rate:  [60-80] 67  (07/11 1745) Resp:  [12-31] 12  (07/11 1745) BP: (62-111)/(52-85) 103/71 mmHg (07/11 1700) SpO2:  [100 %] 100 % (07/11 1745) Arterial Line BP: (97-103)/(59-65) 103/65 mmHg (07/10 1900) FiO2 (%):  [30 %-30.6 %] 30.5 % (07/11 1745) Weight:  [83.3 kg (183 lb 10.3 oz)] 83.3 kg (183 lb 10.3 oz) (07/11 0500)  Intake/Output from previous day: 07/10 0701 - 07/11 0700 In: 4587.9 [I.V.:3668.9; NG/GT:480; IV Piggyback:409] Out: 2833 [Urine:133] Intake/Output this shift: Total I/O In: 2841.3 [I.V.:707.3; NG/GT:500; IV Piggyback:1634] Out: 2188 [Other:1938; Stool:250]  Patient grimaces to pain on sedation status post minimal withdrawal response pupils react still globally encephalopathic andd  possibly  Lab Results:  Basename 08/09/11 1102 08/09/11 0310 08/08/11 0350  WBC -- 19.7* 16.3*  HGB -- 8.7* 8.1*  HCT -- 23.8* 22.1*  PLT 23* 27* --   BMET  Basename 08/09/11 1505 08/09/11 0856  NA 139 140  K 3.2* 3.2*  CL 109 110  CO2 19 20  GLUCOSE 242* 197*  BUN 18 23  CREATININE 1.63* 2.07*  CALCIUM 6.4* 6.2*    Studies/Results: Ct Head Wo Contrast  08/07/2011  *RADIOLOGY REPORT*  Clinical Data: Altered mental status  CT HEAD WITHOUT CONTRAST  Technique:  Contiguous axial images were obtained from the base of the skull through the vertex without contrast.  Comparison: 04/14/2009  Findings: Postoperative changes from left frontal craniotomy and dural scarring are stable.  Encephalomalacia in the inferior left temporal lobe is stable.  There is a new complex right-sided subdural fluid collection with both fluid density and hyperdense elements.  This would raise suspicion for acute or subacute hemorrhage within a chronic subdural fluid collection.  There is 5 mm midline shift to the left.  Some of  this is due to the subdural hematoma and also due to the patient's cranial position.  No evidence of subfalcine herniation.  The subdural complex fluid collection and/or hematoma measures 4 mm in thickness.  There is probably small amount of extra-axial blood over the right frontal lobe as well.  IMPRESSION: Right supratentorial subdural hematoma containing both acute and chronic elements.  Original Report Authenticated By: Donavan Burnet, M.D.   Dg Chest Port 1 View  08/09/2011  *RADIOLOGY REPORT*  Clinical Data: To evaluate endotracheal tube  PORTABLE CHEST - 1 VIEW  Comparison: 08/08/2011.  Findings: Endotracheal tube tip 3.9 cm above the carina.  Right central line tip mid superior vena cava level.  Left Swan-Ganz catheter is in place with the tip projecting at the level of the distal aspect of the right main pulmonary artery/right lower lobe pulmonary artery junction.  No gross pneumothorax.  Cardiomegaly.  Tortuous aorta.  Central pulmonary vascular prominence.  Left base subsegmental atelectasis.  IMPRESSION: Endotracheal tube tip 3.9 cm above the carina.  Right central line tip mid superior vena cava level.  Left Swan-Ganz catheter is in place with the tip projecting at the level of the distal aspect of the right main pulmonary artery/right lower lobe pulmonary artery junction.  No gross pneumothorax.  Cardiomegaly.  Tortuous aorta.  Central pulmonary vascular prominence.  Left base subsegmental atelectasis.  Original Report Authenticated By: Fuller Canada, M.D.   Dg Chest Port 1 View  08/08/2011  *RADIOLOGY REPORT*  Clinical Data: Swan-Ganz placement  PORTABLE CHEST -  1 VIEW  Comparison: Same day  Findings: Endotracheal tube has its tip 2 cm above the carina. Soft feeding tube enters the abdomen.  Swan-Ganz catheter has its tip in the right main pulmonary artery.  Right internal jugular central line has its tip at the SVC/RA junction.  No pneumothorax. There is mild basilar atelectasis.  IMPRESSION:  Lines and tubes well positioned.  Swan-Ganz catheter tip in the right main pulmonary artery, directed towards the right middle or lower lobe branch.  Original Report Authenticated By: Thomasenia Sales, M.D.   Dg Chest Port 1 View  08/08/2011  *RADIOLOGY REPORT*  Clinical Data: Intubated patient.  PORTABLE CHEST - 1 VIEW  Comparison: Chest 08/07/2011.  Findings: Endotracheal tube remains in place in good position with tip at the clavicular heads.  Right IJ catheter is also in good position.  New feeding tube has its tip in the stomach.  There is basilar atelectasis, increased since the prior study and worse on the left.  No pneumothorax.  Heart size normal.  IMPRESSION:  1.  Feeding tube tip is in the stomach. 2.  Increased basilar atelectasis, worse on the left.  Original Report Authenticated By: Bernadene Bell. Maricela Curet, M.D.    Assessment/Plan: Continued global encephalopathy unknown etiology possible subclinical seizure activity possible status CT results pending continue workup for causes of his encephalopathy. The small subdural is not causing any mass effect to explain his neurologic exam. If patient medically stable would recommend additional imaging possibly an MRI scan he would be nice to give him contrast however this may not be possible with his other comorbid medical conditions. We'll continue to follow  LOS: 2 days     Sareena Odeh P 08/09/2011, 5:58 PM

## 2011-08-09 NOTE — Progress Notes (Signed)
CRITICAL VALUE ALERT  Critical value received:  Calcium 6.4  Date of notification: 08/09/11  Time of notification:  1610  Critical value read back:yes  Nurse who received alert:  Jasnoor Trussell Illene Regulus  MD notified (1st page):  byrum  Current with previous results

## 2011-08-09 NOTE — Progress Notes (Signed)
CRITICAL VALUE ALERT  Critical value received:  plt 27  Date of notification:  08/09/2011  Time of notification:  0613  Critical value read back: yes  Nurse who received alert:  Berdine Dance  MD notified (1st page):  Sood  Time of first page:  0623  MD notified (2nd page):  Time of second page:  Responding MD: Craige Cotta  Time MD responded:  [phone]

## 2011-08-09 NOTE — Procedures (Signed)
EEG NUMBER:  [id number].   Beginning time is August 08, 2011, at 12 p.m.  End time is August 09, 2011, at 4 a.m.  This 24-hour intensive telemetry EEG with simultaneous video monitoring was requested in this 56 year old man who has had altered mental status. He has a history of seizures and ischemic stroke.  He also has a history of meningioma resection.  He has been admitted with a recent subdural hemorrhage.  His medications include phenytoin, fentanyl, and midazolam.  The EEG captured period without clinical events of interest as marked by push button.  Review of entirety of the tracing did not reveal any obvious electrographic seizures.  During periods of maximal wakefulness, background activities were composed of mixed frequency and delta and theta activities.  These seem to be a slightly higher amplitude across the left hemisphere.  Faster activity did seem to be concentrated more in the frontal regions.  The right hemisphere activities were more characterized by lower-amplitude delta activities.  The periods of increased wakefulness did appear to be more prevalent at the end of the tracing.  Presumably patient was much more sedated in the beginning of the tracing.  Here again during periods of sedation the preponderance of activities was polymorphic delta activities that were low-to-medium amplitude. Superimposed upon this were frontally dominant alpha and beta activities better seen across the left hemisphere.  Activities across the right hemisphere were lower amplitude.  CLINICAL INTERPRETATION:  This 24-hour intensive telemetry EEG with simultaneous respiratory monitoring captured period without clinical evidence of interest.  Review of the entirety of the tracing did not reveal any electrographic seizures.  Background activities were too slow, but reactive suggesting a moderate encephalopathy, of nonspecific etiology.  Part of the encephalopathy is probably related to the  use of sedating agents.  In addition, there was asymmetry of activities with more suppressed activities across the right hemisphere.  These suppressed activities are probably secondary to the known subdural fluid collection in that area.          ______________________________ Denton Meek, MD    [id number] D:  08/09/2011 22:13:52  T:  08/09/2011 22:50:34  Job #:  [id number]

## 2011-08-09 NOTE — Progress Notes (Addendum)
EEG resulted: No electrographic seizures seen. There is diffuse slowing with left frontal breach and reactivity.  24 hour EEG discontinued.   I have reviewed the primary team's assessment and plan. Multifactorial etiologies for the patients AMS, including seizure with postictal state, subdural hematoma with midline shift, possible EtOH withdrawal. I agree with the primary team's current plan, including Dilantin, Keppra and thiamine rx, close monitoring. Would obtain Q3d dilantin level.   Electronically signed: Dr. Caryl Pina

## 2011-08-09 NOTE — Progress Notes (Signed)
Name: [name] MRN: [id number] DOB: 1955/07/03    LOS: 2  Referring Provider:  Transferred from [facility] Reason for Referral:  Altered mental status  PULMONARY / CRITICAL CARE MEDICINE   Brief patient description:  7/9  Brought to [facility] with altered mental status.  Head CT demonstrated small right subdural hematoma with 4 mm midline shift.  Labs demonstarted multiple metabolic derangements.Transferred to [facility] for farther management.  Interval/subjective 7/10-Had a seizure ~530 am this am, treated w/ 1 mg ativan. [id number] PCCM team called to bedside for eval of Persistent hypotension not responding to volume challenge and decrease in MS. On bedside eval he is unresponsive. Will bite down when attempting to illicit gag, but has no other response to noxious stimuli. Currently SBP in 60s. Intubated. Central line placed. Transferred to ICU due to continued hypotension of unknown origin.  Current status:  intubated, high level sedation Undergoing continuous EEG   Vital Signs:  Reviewed  Temp:  [93 F (33.9 C)-98.6 F (37 C)] 93.4 F (34.1 C) (07/11 0900) Pulse Rate:  [60-81] 65  (07/11 0900) Resp:  [17-31] 20  (07/11 0900) BP: (62-109)/(52-84) 86/58 mmHg (07/11 0759) SpO2:  [100 %] 100 % (07/11 0900) Arterial Line BP: (97-116)/(59-77) 103/65 mmHg (07/10 1900) FiO2 (%):  [30 %-30.4 %] 30.4 % (07/11 0900) Weight:  [183 lb 10.3 oz (83.3 kg)] 183 lb 10.3 oz (83.3 kg) (07/11 0500)  Intake/Output Summary (Last 24 hours) at 08/09/11 1057 Last data filed at 08/09/11 1003  Gross per 24 hour  Intake 4850.7 ml  Output   3653 ml  Net 1197.7 ml     Physical Examination: General:  Intubated, sedated Neuro:  Sedated, does not follow commands HEENT:  PERRL Neck:  Supple, no JVD Cardiovascular:  RRR, no murmurs Lungs:  CTAB Abdomen:  Soft, nontender, bowel sounds present Musculoskeletal:  Spontaneously moves all extremities, no edema Skin:  Intact  Active Problems:  NEOP, BNG, CEREBRAL  MENINGES  HYPERTENSION, BENIGN ESSENTIAL  HYPERTENSION, KIDNEY DISEASE, UNCONTROLLED  SEIZURE DISORDER  Encephalopathy acute  Hepatitis C without mention of hepatic coma  Diastolic heart failure  Subdural hematoma  Hypokalemia  Thrombocytopenia  Acidosis  Anemia  Malnutrition  Acute respiratory failure  Shock  Altered mental status  AKI (acute kidney injury)  ASSESSMENT AND PLAN  PULMONARY  Lab 08/09/11 0454 08/08/11 0400 08/07/11 1013 08/07/11 0455  PHART 7.536* 7.379 7.336* 7.382  PCO2ART 23.3* 18.6* 16.0* 13.2*  PO2ART 125.0* 166.0* 514.0* 160.0*  HCO3 20.3 10.7* 8.3* 7.6*  O2SAT 99.0 99.8 100.0 99.6   Ventilator Settings: Vent Mode:  [-] PRVC FiO2 (%):  [30 %-30.4 %] 30.4 % Set Rate:  [22 bmp] 22 bmp Vt Set:  [600 mL] 600 mL PEEP:  [5 cmH20] 5 cmH20 Plateau Pressure:  [18 cmH20-41 cmH20] 27 cmH20 CXR: 7/11 CXR-mildly improvedvascular congestion ETT: 7/9>>>  A:  Acute Respiratory Failure in setting of Inability to protect airway s/p seizure and complicated by multiple metabolic derangements.  P:   Maintain on full vent support while neurologic status is uncertain. Will turn down RR given respiratory alkalosis.  CXR mildly improved vasc congestion (with UF and CVVHD) Send sputum culture, pending.  CARDIOVASCULAR  Lab 08/08/11 2115 08/08/11 1545 08/07/11 1155 08/07/11 0530  TROPONINI -- -- -- 0.39*  LATICACIDVEN 2.3* -- 0.8 5.3*  PROBNP -- 3123.0* -- --   ECG:  NA Lines:right IJ CVL 7/9>>> Left rad aline 7/9>>>  A: History of diastolic CHF, no evidence of exacerbation.  History  of hypetension. Has mild elevated Trop I. Hard to interpret in the setting of renal failure.  P:  Cardiac enzymes positive. BNP elevated but difficult to interpret in light of multiple comorbitides.  ECHO hyperdynamic with EF of 75-80%, might consider swan if hypotension remains an issue.  A:Circulatory Shock. Unclear etiology. May be residual acidosis vs sepsis vs some mix.   Heart is hyperdynamic by TTE.  Volume status appears adequate however given a CVP of 13.  Swan in place to check PCWP and SVR. Suspect infection vs acidosis due to renal failure as cause for his shock P: CVP 13, lactate trending down from 5.3 to 2.3, PCT 0.66 Levophed for MAP >65-continues to require  CVVHD per renal Bicarb started to keep ph > 7.2. Bicarb gtt now off as of 7/11 Cortisol 17.4, on hydrocortisone IVF now [facility]  RENAL  Lab 08/09/11 0310 08/08/11 2115 08/08/11 1545 08/08/11 0900 08/08/11 0350 08/07/11 1714 08/07/11 0530  NA 142 143 144 143 146* -- --  K 2.7* 2.5* -- -- -- -- --  CL 111 118* 119* 118* 121* -- --  CO2 19 11* 10* 11* 10* -- --  BUN 27* 41* 43* 42* 43* -- --  CREATININE 2.43* 3.81* 3.87* 3.88* 3.95* -- --  CALCIUM 5.8* 5.6* 5.9* 5.7* 5.7* -- --  MG 1.7 -- -- -- 1.4* 1.5 1.9  PHOS 1.6* 1.9* -- -- 1.4* 1.9* --   Intake/Output      07/10 0701 - 07/11 0700 07/11 0701 - 07/12 0700   I.V. (mL/kg) 3668.9 (44) 131.2 (1.6)   Blood     Other 30    NG/GT 480 125   IV Piggyback 409 234   Total Intake(mL/kg) 4587.9 (55.1) 490.2 (5.9)   Urine (mL/kg/hr) 133 (0.1)    Other 2700 429   Total Output 2833 429   Net +1754.9 +61.2        Stool Occurrence 2 x     Foley:  7/9  A:  Acute on chronic renal failure (Cr 5.5), in setting of Dehydration. CKD and renal artery stenosis.  P: Appreciate renal recommendations.  Due to CVVHD CVP monitoring and levophed to ensure adequate filling pressures and MAP.  Renal US pending. Close I&O  Renal following potassium and repleting. Also repleting phosphorus.   Gap/non-gap metabolic acidosis. Likely due to renal failure and hypoperfusion.  P: Recheck lactate Bicarb infusion Q 6 chemistry  D/C NS to avoid hyperchloremia   GASTROINTESTINAL  Lab 08/09/11 0310 08/08/11 2115 08/08/11 0350 08/07/11 0530  AST 32 -- 42* 49*  ALT 28 -- 27 33  ALKPHOS 86 -- 76 91  BILITOT 0.6 -- 0.7 0.5  PROT 4.6* -- 4.3* 5.0*  ALBUMIN 1.1*  1.1* 1.1* 1.4*    A:  History of hepatitis C.  Suspected malnutrition (Alb 1.4). P:   LFT noted. Prealbumin. Consult nutrition for TF.  HEMATOLOGIC  Lab 08/09/11 0310 08/08/11 2115 08/08/11 0350 08/07/11 1855 08/07/11 0530  HGB 8.7* -- 8.1* 7.8* 7.5*  HCT 23.8* -- 22.1* 21.3* 20.7*  PLT 27* -- 61* -- 60*  INR -- 1.33 -- -- 1.23  APTT 165* 44* -- -- 37   A:  Mild anemia, macrocitic (Hb 8.0, MCV 109).  Thrombocytopenia (Plt 60 >> 27).  Both likely alcohol related. P:  CBC PTT 37 / INR 1.23 Check HITT panel and DIC panel  INFECTIOUS  Lab 08/09/11 0310 08/08/11 0350 08/07/11 1155 08/07/11 0530  WBC 19.7* 16.3* -- 9.6  PROCALCITON --  0.66 0.66 0.99   Cultures: BCX2 7/9>>>NTD Sputum 7/9>>>NTD  Antibiotics: Vanc 7/10>>> Zosyn 7/10 >>   A:  No evidence of acute infection but patient is becoming more hypotensive and shocky. P:   Recheck PCT 0.66 empiric abx as ordered  ENDOCRINE  Lab 08/08/11 1957 08/07/11 0424  GLUCAP 152* 88   A:  No active issues P:   No intervention required  NEUROLOGIC  7/9  Head CT >>> Small right subdural hematoma with 4 mm midline shift.   A:  Acute encephalopathy.  History of alcohol abuse.  History of meningioma resection.  History of seizure disorder on Dilantin.  History of medical noncompliance. Had witnessed seizure in ICU. Post-ictal this am.  Wonder if Hep C encephalitis is a possibility here.   P:   Continuous EEG to r/o recurrent seizures Thiamine & Folate. Continue Lexapro. Hold Trazodone  Neurosurgery following >> no role intervention at this time Dilantin per pharmacy.   Tana Conch, MD, PGY2 08/09/2011 10:09 AM   Attending Addendum:  I have seen the patient, discussed the issues, test results and plans with Dr Durene Cal. I agree with the Assessment and Plans as ammended above.   Levy Pupa, MD, PhD 08/09/2011, 11:04 AM [employer] [facility] [phone] or if no answer [phone]

## 2011-08-10 ENCOUNTER — Inpatient Hospital Stay (HOSPITAL_COMMUNITY): Payer: Medicare Other

## 2011-08-10 DIAGNOSIS — N19 Unspecified kidney failure: Secondary | ICD-10-CM

## 2011-08-10 LAB — CBC
HCT: 24.4 % — ABNORMAL LOW (ref 39.0–52.0)
Hemoglobin: 8.6 g/dL — ABNORMAL LOW (ref 13.0–17.0)
MCV: 94.2 fL (ref 78.0–100.0)
RBC: 2.59 MIL/uL — ABNORMAL LOW (ref 4.22–5.81)
WBC: 20.1 10*3/uL — ABNORMAL HIGH (ref 4.0–10.5)

## 2011-08-10 LAB — POCT I-STAT 3, ART BLOOD GAS (G3+)
Acid-base deficit: 1 mmol/L (ref 0.0–2.0)
O2 Saturation: 99 %
pCO2 arterial: 30.5 mmHg — ABNORMAL LOW (ref 35.0–45.0)

## 2011-08-10 LAB — HEPATIC FUNCTION PANEL
ALT: 27 U/L (ref 0–53)
AST: 30 U/L (ref 0–37)
Albumin: 1.1 g/dL — ABNORMAL LOW (ref 3.5–5.2)
Alkaline Phosphatase: 93 U/L (ref 39–117)
Total Bilirubin: 0.4 mg/dL (ref 0.3–1.2)

## 2011-08-10 LAB — PROCALCITONIN: Procalcitonin: 0.93 ng/mL

## 2011-08-10 LAB — BASIC METABOLIC PANEL
BUN: 13 mg/dL (ref 6–23)
Creatinine, Ser: 1.45 mg/dL — ABNORMAL HIGH (ref 0.50–1.35)
GFR calc Af Amer: 61 mL/min — ABNORMAL LOW (ref >90)
GFR calc non Af Amer: 53 mL/min — ABNORMAL LOW (ref >90)
Potassium: 3.7 mEq/L (ref 3.5–5.1)

## 2011-08-10 LAB — SAVE SMEAR

## 2011-08-10 LAB — LACTIC ACID, PLASMA: Lactic Acid, Venous: 2.3 mmol/L — ABNORMAL HIGH (ref 0.5–2.2)

## 2011-08-10 MED ORDER — DIPHENHYDRAMINE HCL 25 MG PO CAPS: 25.0000 mg | ORAL_CAPSULE | Freq: Four times a day (QID) | ORAL | Status: DC | PRN

## 2011-08-10 MED ORDER — CALCIUM GLUCONATE 10 % IV SOLN
2.0000 g | INTRAVENOUS | Status: DC | PRN
  Filled 2011-08-10: qty 20

## 2011-08-10 MED ORDER — PRISMASOL BGK 4/2.5 32-4-2.5 MEQ/L IV SOLN
INTRAVENOUS | Status: DC
  Administered 2011-08-10 – 2011-08-14 (×5): via INTRAVENOUS_CENTRAL
  Filled 2011-08-10 (×8): qty 5000

## 2011-08-10 MED ORDER — PRISMASOL BGK 4/2.5 32-4-2.5 MEQ/L IV SOLN
INTRAVENOUS | Status: DC
  Administered 2011-08-10 – 2011-08-14 (×5): via INTRAVENOUS_CENTRAL
  Filled 2011-08-10 (×17): qty 5000

## 2011-08-10 MED ORDER — ACD FORMULA A 0.73-2.45-2.2 GM/100ML VI SOLN
Status: AC
  Filled 2011-08-10: qty 500

## 2011-08-10 MED ORDER — OSMOLITE 1.2 CAL PO LIQD
1000.0000 mL | ORAL | Status: DC
  Administered 2011-08-10 – 2011-08-12 (×3): 1000 mL
  Filled 2011-08-10 (×4): qty 1000

## 2011-08-10 MED ORDER — ANTICOAGULANT SODIUM CITRATE 4% (200MG/5ML) IV SOLN
5.0000 mL | Freq: Once | Status: DC
  Filled 2011-08-10: qty 250

## 2011-08-10 MED ORDER — SODIUM CHLORIDE 0.9 % IV SOLN
4.0000 g | INTRAVENOUS | Status: DC | PRN
  Administered 2011-08-12: 4 g via INTRAVENOUS
  Filled 2011-08-10 (×2): qty 40

## 2011-08-10 MED ORDER — SODIUM PHOSPHATE 3 MMOLE/ML IV SOLN
30.0000 mmol | Freq: Once | INTRAVENOUS | Status: AC
  Administered 2011-08-10: 30 mmol via INTRAVENOUS
  Filled 2011-08-10: qty 10

## 2011-08-10 MED ORDER — CALCIUM CARBONATE ANTACID 500 MG PO CHEW
2.0000 | CHEWABLE_TABLET | ORAL | Status: DC | PRN
Start: 2011-08-10 — End: 2011-08-14
  Filled 2011-08-10: qty 2

## 2011-08-10 MED ORDER — ACD FORMULA A 0.73-2.45-2.2 GM/100ML VI SOLN
500.0000 mL | Status: DC
  Administered 2011-08-12: 500 mL via INTRAVENOUS
  Filled 2011-08-10 (×2): qty 500

## 2011-08-10 MED ORDER — CALCIUM GLUCONATE 10 % IV SOLN
2.0000 g | Freq: Once | INTRAVENOUS | Status: DC
  Filled 2011-08-10: qty 20

## 2011-08-10 MED ORDER — PRISMASOL BGK 4/2.5 32-4-2.5 MEQ/L IV SOLN
INTRAVENOUS | Status: DC
  Administered 2011-08-10 – 2011-08-14 (×28): via INTRAVENOUS_CENTRAL
  Filled 2011-08-10 (×45): qty 5000

## 2011-08-10 MED ORDER — SODIUM CHLORIDE 0.9 % IV SOLN
4.0000 g | INTRAVENOUS | Status: DC | PRN
  Filled 2011-08-10: qty 40

## 2011-08-10 MED ORDER — SODIUM CHLORIDE 0.9 % IV SOLN
Freq: Once | INTRAVENOUS | Status: DC
  Filled 2011-08-10: qty 200

## 2011-08-10 MED ORDER — SODIUM CHLORIDE 0.9 % IV SOLN
2.0000 g | INTRAVENOUS | Status: DC | PRN
  Filled 2011-08-10 (×2): qty 20

## 2011-08-10 MED ORDER — ACETAMINOPHEN 325 MG PO TABS: 650.0000 mg | ORAL_TABLET | ORAL | Status: DC | PRN

## 2011-08-10 MED ORDER — METHYLPREDNISOLONE SODIUM SUCC 125 MG IJ SOLR
125.0000 mg | Freq: Two times a day (BID) | INTRAMUSCULAR | Status: DC
  Administered 2011-08-10 – 2011-08-15 (×10): 125 mg via INTRAVENOUS
  Filled 2011-08-10 (×13): qty 2

## 2011-08-10 MED ORDER — PIPERACILLIN-TAZOBACTAM 3.375 G IVPB 30 MIN
3.3750 g | Freq: Four times a day (QID) | INTRAVENOUS | Status: DC
  Administered 2011-08-10: 3.375 g via INTRAVENOUS
  Filled 2011-08-10 (×4): qty 50

## 2011-08-10 MED ORDER — DIPHENHYDRAMINE HCL 25 MG PO CAPS
25.0000 mg | ORAL_CAPSULE | Freq: Four times a day (QID) | ORAL | Status: DC | PRN
  Administered 2011-08-12 – 2011-08-13 (×2): 25 mg via ORAL
  Filled 2011-08-10 (×2): qty 1

## 2011-08-10 MED ORDER — CALCIUM GLUCONATE 10 % IV SOLN
2.0000 g | INTRAVENOUS | Status: DC | PRN
  Administered 2011-08-11 – 2011-08-13 (×2): 2 g via INTRAVENOUS
  Filled 2011-08-10 (×3): qty 20

## 2011-08-10 MED ORDER — PIPERACILLIN-TAZOBACTAM 3.375 G IVPB 30 MIN
3.3750 g | Freq: Four times a day (QID) | INTRAVENOUS | Status: DC
  Administered 2011-08-10 – 2011-08-13 (×11): 3.375 g via INTRAVENOUS
  Filled 2011-08-10 (×13): qty 50

## 2011-08-10 MED ORDER — PRO-STAT SUGAR FREE PO LIQD
60.0000 mL | Freq: Three times a day (TID) | ORAL | Status: DC
  Administered 2011-08-10 – 2011-08-13 (×9): 60 mL
  Filled 2011-08-10 (×11): qty 60

## 2011-08-10 MED ORDER — SODIUM CHLORIDE 0.9 % FOR CRRT
INTRAVENOUS_CENTRAL | Status: DC | PRN
  Filled 2011-08-10: qty 1000

## 2011-08-10 MED ORDER — ACD FORMULA A 0.73-2.45-2.2 GM/100ML VI SOLN
500.0000 mL | Status: DC
  Filled 2011-08-10 (×2): qty 500

## 2011-08-10 NOTE — Progress Notes (Signed)
Subjective: Interval History: none.  Objective: Vital signs in last 24 hours: Temp:  [93.2 F (34 C)-95.7 F (35.4 C)] 95.5 F (35.3 C) (07/12 0600) Pulse Rate:  [62-82] 75  (07/12 0600) Resp:  [12-26] 15  (07/12 0600) BP: (77-111)/(58-85) 87/71 mmHg (07/12 0600) SpO2:  [100 %] 100 % (07/12 0600) FiO2 (%):  [30 %-30.8 %] 30.8 % (07/12 0600) Weight change:   Intake/Output from previous day: 07/11 0701 - 07/12 0700 In: 4938.4 [I.V.:1609.4; NG/GT:1260; IV Piggyback:2069] Out: 4384 [Urine:25; Stool:450] Intake/Output this shift:    General appearance: opens eye, winces Resp: rhonchi bilaterally Cardio: S1, S2 normal and systolic murmur: holosystolic 2/6, blowing at apex GI: pos bs, liver down 5 cm soft Extremities: edema 2+  Lab Results:  Basename 08/10/11 0310 08/09/11 1102 08/09/11 0310  WBC 20.1* -- 19.7*  HGB 8.6* -- 8.7*  HCT 24.4* -- 23.8*  PLT 16* 23* --   BMET:  Basename 08/10/11 0310 08/09/11 2110  NA 138 137  K 3.7 3.2*  CL 107 106  CO2 21 24  GLUCOSE 189* 157*  BUN 13 15  CREATININE 1.45* 1.49*  CALCIUM 6.4* 6.5*    Basename 08/08/11 2115  PTH 201.5*   Iron Studies:  Basename 08/08/11 2115  IRON <10*  TIBC Not calculated due to Iron <10.  TRANSFERRIN --  FERRITIN --    Studies/Results: Dg Chest Port 1 View  08/09/2011  *RADIOLOGY REPORT*  Clinical Data: To evaluate endotracheal tube  PORTABLE CHEST - 1 VIEW  Comparison: 08/08/2011.  Findings: Endotracheal tube tip 3.9 cm above the carina.  Right central line tip mid superior vena cava level.  Left Swan-Ganz catheter is in place with the tip projecting at the level of the distal aspect of the right main pulmonary artery/right lower lobe pulmonary artery junction.  No gross pneumothorax.  Cardiomegaly.  Tortuous aorta.  Central pulmonary vascular prominence.  Left base subsegmental atelectasis.  IMPRESSION: Endotracheal tube tip 3.9 cm above the carina.  Right central line tip mid superior vena cava  level.  Left Swan-Ganz catheter is in place with the tip projecting at the level of the distal aspect of the right main pulmonary artery/right lower lobe pulmonary artery junction.  No gross pneumothorax.  Cardiomegaly.  Tortuous aorta.  Central pulmonary vascular prominence.  Left base subsegmental atelectasis.  Original Report Authenticated By: Fuller Canada, M.D.   Dg Chest Port 1 View  08/08/2011  *RADIOLOGY REPORT*  Clinical Data: Swan-Ganz placement  PORTABLE CHEST - 1 VIEW  Comparison: Same day  Findings: Endotracheal tube has its tip 2 cm above the carina. Soft feeding tube enters the abdomen.  Swan-Ganz catheter has its tip in the right main pulmonary artery.  Right internal jugular central line has its tip at the SVC/RA junction.  No pneumothorax. There is mild basilar atelectasis.  IMPRESSION: Lines and tubes well positioned.  Swan-Ganz catheter tip in the right main pulmonary artery, directed towards the right middle or lower lobe branch.  Original Report Authenticated By: Thomasenia Sales, M.D.    I have reviewed the patient's current medications.  Assessment/Plan: 1 AKI/CKD vol xs but O2 ok so hold even. Acid base stable still mild acidemia.  Good solute clearance.Hold off anticoag with low ptlt and^ coags. 2 Resp failure stable per CCM 3 Low ptlt cause ?? DIC vs other 4 SZ stable 5 Anemia stable 6 Nutrition TF  P Cvvhd, d/c foley, TF, vent    LOS: 3 days   [name] L  08/10/2011,7:21 AM

## 2011-08-10 NOTE — Consult Note (Signed)
[facility] CONSULTATION NOTE 08-10-11 Reason for Consult: Rule Out TTP  [id number] R [name] is a 56 y.o. African American male  transferred to [facility] critical care from  [facility] on 7/9 for further management of multiple metabolic abnormalities. Past medical history is significant for "heavy, daily" ETOH back at least to 2008, chronic hepatitis C, seizure in 2008, resection of meningioma and finding of right parietal embolic strokes in 2008, stage III CKD and HTN/ diastolic CHF. Last imaging of abdomen was CT angio abdomen in 08-2006 "unremarkable liver and spleen" and renal US this admission "incidental note of ascites".   Patient was taken to [facility] 08-06-11 by EMS from home after family called due to change in mental status. ED notes report "recent  heavy ETOH" and multiple falls, and note that meds had not been refilled since 09-2010.  Evaluation at [facility] included labs with WBC 11, Hgb 8.0, MCV 109, plt 60 and K+ 1.8, CO2 10, CA++ 6.5, BUN 56, creat 5.47, alb 1.4, SGOT 67, dilantin 1.1, CK MB 6.5.  CT head without contrast showed small right subdural hematoma, possibly right frontal age-indeterminate infarcts and changes from prior craniotomy. He was transferred to [facility] from [facility] ED early am 08-07-11. He had witnessed focal seizure and was loaded with dilantin and keppra and was intubated to protect airway.  He was hypotensive/ circulatory shock  but no elevated temperatures in EMR, begun on empiric vanc and zosyn as well as initial pressors and IVF.  Two blood cultures from  08-07-11 negative to date and C diff negative by PCR. He was seen by neurosurgery, has not needed surgical intervention for the small subdural and mental status reportedly is some better today (opens eyes and tracks). Nephrology has been following with institution of CVHD (note creatinine "in 2s" 4 years ago).   Platelets on admission to [facility] 08-07-11 60k, with hgb 7.5 and WBC 9.6. On 08-08-11 plt stable at 61k, then 08-09-11 plt 27k  with repeat 23k, and this am plt 16k with hgb 8.6 and WBC 20.1. No transfusions to date. Solumedrol 125 mg every 12 hrs begun today. He did receive heparin (with CVHD?) on 08-08-11 and 08-09-11, now [facility]. Plasmaphoresis has been started today.  Only prior CBCs available are 07-2006 with plt 208k (BUN 25 and creat 1.68); 08-2006  plt 188k; 05-2008   plt 141k.  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 DIC screen on 08-09-11 reportedly INR 1.1, PTT 188, D dimer 3.91, plt 23k and "schistocytes noted".   Peripheral blood smears x 2 from 0300 today reviewed now. At most, I see < 5 schistocytes total on multiple fields/ 2 slides.    PMH:  Past Medical History  Diagnosis Date  . HTN (hypertension), malignant   . CKD (chronic kidney disease), stage III (Dr. Detterding)   . Renal artery stenosis     moderate  . Meningioma     s/p resection  . Seizure disorder   . Alcohol abuse     hx  . TIA (transient ischemic attack)   . Diastolic CHF, acute     acute?; echo 7/10 with EF 65%, moderate LVH  . HCV (hepatitis C virus)       Surgeries: Past Surgical History  Procedure Date  . Brain surgery 03/2009 Dr. Newell Coral  meningioma resection  DR Newell Coral 2008.  Allergies: No Known Allergies  Medications:  Prior to Admission:  Prescriptions prior to admission  Medication Sig Dispense Refill  . amLODipine (NORVASC) 5 MG tablet Take 5 mg by mouth daily.        Marland Kitchen aspirin 81 MG EC tablet Take 81 mg by mouth daily.        . carvedilol (COREG) 25 MG tablet Take 25 mg by mouth 2 (two) times daily.        . colchicine 0.6 MG tablet Take 0.6 mg by mouth 2 (two) times daily as needed.       Marland Kitchen eplerenone (INSPRA) 50 MG tablet Take 50 mg by mouth daily.        Marland Kitchen escitalopram (LEXAPRO) 20 MG tablet Take 20 mg by mouth daily.        .  isosorbide-hydrALAZINE (BIDIL) 20-37.5 MG per tablet Take 2 tablets by mouth 3 (three) times daily.        Marland Kitchen lisinopril (PRINIVIL,ZESTRIL) 20 MG tablet Take 20 mg by mouth daily.        . minoxidil (LONITEN) 2.5 MG tablet Take 2.5 mg by mouth 2 (two) times daily.        . phenytoin (DILANTIN) 100 MG ER capsule Take 200 mg by mouth 2 (two) times daily.        . traZODone (DESYREL) 100 MG tablet Take 100 mg by mouth at bedtime.          AVW:UJWJXB chloride, acetaminophen, calcium gluconate IVPB, calcium gluconate IVPB, calcium gluconate, diphenhydrAMINE, fentaNYL, heparin, midazolam, sodium chloride  ROS: Unable to obtain at this time. Family History:  No family history on file.  Social History:  does not have a smoking history on file. He does not have any smokeless tobacco history on file. His alcohol and drug histories not on file. Lives in [place]. No family here presently.  Addendum: 2 family members have arrived, one of whom lives across street from patient and had not seen him in several days prior to being taken to [facility]. They confirm daily heavy ETOH x years.   Physical Exam  56 year old AAM orally intubated, plasmaphoresis in process.  Opens and closes eyes to sound and touch. HEENT: Normocephalic, atraumatic, PERRLA.  Neck supple. no thyromegaly, no cervical or supraclavicular adenopathy  Lungs No wheezing, coarse rhonchi bilaterally anterior.. No axillary masses. Cardiac regular rate and rhythm, II/VI systolic murmur  Abdomen soft nontender , bowel sounds x4. Liver palpable about 4 fingers below the costal border. Cannot tell splenomegaly. GU/rectal: deferred. Extremities no clubbing cyanosis 1 + edema bilaterally. No bruising or petechial rash Neuro, responds to sound by opening and closing eyes, but does not follow commands No bleeding at any lines or tubes.   Labs:  CBC   Lab 08/10/11 0310 08/09/11 1102 08/09/11 0310 08/08/11 0350 08/07/11 1855 08/07/11 0530  WBC  20.1* -- 19.7* 16.3* -- 9.6  HGB 8.6* -- 8.7* 8.1* 7.8* 7.5*  HCT 24.4* -- 23.8* 22.1* 21.3* 20.7*  PLT 16* 23* 27* 61* -- 60*  MCV 94.2 -- 90.5 90.9 -- 95.4  MCH 33.2 -- 33.1 33.3 -- 34.6*  MCHC 35.2 -- 36.6* 36.7* -- 36.2*  RDW 18.6* -- 17.8* 16.7* -- 14.3  LYMPHSABS -- -- 0.8 -- -- 1.0  MONOABS -- -- 1.0 -- -- 1.0  EOSABS -- -- 0.0 -- -- 0.0  BASOSABS -- -- 0.0 -- -- 0.0  BANDABS -- -- -- -- -- --  Anemia panel:   Basename 08/09/11 0856 08/08/11 2115  VITAMINB12 -- --  FOLATE -- --  FERRITIN -- --  TIBC -- Not calculated due to Iron <10.  IRON -- <10*  RETICCTPCT 4.1* --      CMP    Lab 08/10/11 0310 08/09/11 2110 08/09/11 1505 08/09/11 0856 08/09/11 0310 08/08/11 0350 08/07/11 1714 08/07/11 0530  NA 138 137 139 140 142 -- -- --  K 3.7 3.2* 3.2* 3.2* 2.7* -- -- --  CL 107 106 109 110 111 -- -- --  CO2 21 24 19 20 19  -- -- --  GLUCOSE 189* 157* 242* 197* 199* -- -- --  BUN 13 15 18 23  27* -- -- --  CREATININE 1.45* 1.49* 1.63* 2.07* 2.43* -- -- --  CALCIUM 6.4* 6.5* 6.4* 6.2* 5.8* -- -- --  MG 1.9 -- -- -- 1.7 1.4* 1.5 1.9  AST 30 -- -- -- 32 42* -- 49*  ALT 27 -- -- -- 28 27 -- 33  ALKPHOS 93 -- -- -- 86 76 -- 91  BILITOT 0.4 -- -- -- 0.6 0.7 -- 0.5        Component Value Date/Time   BILITOT 0.4 08/10/2011 0310   BILIDIR 0.2 08/10/2011 0310   IBILI 0.2* 08/10/2011 0310       Lab 08/09/11 1102 08/09/11 0856 08/08/11 2115 08/07/11 0530  INR 1.17 1.30 1.33 1.23  PROTIME -- -- -- --     Basename 08/09/11 1102  DDIMER 3.91*      Imaging Studies:  Dg Chest Port 1 View  08/10/2011  *RADIOLOGY REPORT*  Clinical Data: 56 year old male with seizure disorder, encephalopathy, acute respiratory failure.  Intubated.  PORTABLE CHEST - 1 VIEW  Comparison: 08/09/2011 and earlier.  Findings: Portable semi upright AP view at 0537 hours. Endotracheal tube tip in stable position between the level of clavicles and carina.  Left subclavian approach Swan-Ganz  catheter remains in place, tip is at the level of the right lower lobe pulmonary artery.  Stable right IJ central line.  Enteric feeding tube courses to the abdomen, tip not definitely identified.  Continued low lung volumes.  No pulmonary edema.  No definite pleural effusion.  Patchy bibasilar opacity is stable and most resembles atelectasis.  IMPRESSION: 1.  Swan-Ganz catheter tip at the right lower lobe pulmonary artery level. 2.  Otherwise, stable lines and tubes. 3.  Stable mild atelectasis.  Original Report Authenticated By: Harley Hallmark, M.D.   Dg Chest Port 1 View  08/09/2011  *RADIOLOGY REPORT*  Clinical Data: To evaluate endotracheal tube  PORTABLE CHEST - 1 VIEW  Comparison: 08/08/2011.  Findings: Endotracheal tube tip 3.9 cm above the carina.  Right central line tip mid superior vena cava level.  Left Swan-Ganz catheter is in place with the tip projecting at the level of the distal aspect of the right main pulmonary artery/right lower lobe pulmonary artery junction.  No gross pneumothorax.  Cardiomegaly.  Tortuous aorta.  Central pulmonary vascular prominence.  Left base subsegmental atelectasis.  IMPRESSION: Endotracheal tube tip 3.9 cm above the carina.  Right central line tip mid superior vena cava level.  Left Swan-Ganz catheter is in place with the tip projecting at the level of the distal aspect of the right main pulmonary artery/right lower lobe pulmonary artery junction.  No gross pneumothorax.  Cardiomegaly.  Tortuous aorta.  Central pulmonary vascular prominence.  Left base subsegmental atelectasis.  Original Report Authenticated By: Fuller Canada, M.D.  Dg Chest Port 1 View  08/08/2011  *RADIOLOGY REPORT*  Clinical Data: Swan-Ganz placement  PORTABLE CHEST - 1 VIEW  Comparison: Same day  Findings: Endotracheal tube has its tip 2 cm above the carina. Soft feeding tube enters the abdomen.  Swan-Ganz catheter has its tip in the right main pulmonary artery.  Right internal jugular  central line has its tip at the SVC/RA junction.  No pneumothorax. There is mild basilar atelectasis.  IMPRESSION: Lines and tubes well positioned.  Swan-Ganz catheter tip in the right main pulmonary artery, directed towards the right middle or lower lobe branch.  Original Report Authenticated By: Thomasenia Sales, M.D.    CT HEAD WITHOUT CONTRAST  Technique: Contiguous axial images were obtained from the base of the skull through the vertex without contrast.  Comparison: 04/14/2009  Findings: Postoperative changes from left frontal craniotomy and dural scarring are stable. Encephalomalacia in the inferior left  temporal lobe is stable.There is a new complex right-sided subdural fluid collection with both fluid density and hyperdense elements. This would raise suspicion for acute or subacute hemorrhage within a chronic subdural fluid collection. There is 5 mm midline shift to the  left. Some of this is due to the subdural hematoma and also due to the patient's cranial position. No evidence of subfalcine herniation. The subdural complex fluid collection and/or hematoma measures 4 mm in thickness. There is probably small amount of  extra-axial blood over the right frontal lobe as well.  IMPRESSION:  Right supratentorial subdural hematoma containing both acute and chronic    A/P: 56 y.o. male asked to see for evaluation of thrombocytopenia/ RO TTP. Dr. Darrold Span   is to see the patient following this consult with recommendations regarding diagnosis, treatment options and further workup studies, r/o TTP. Addendum to be written.  Thank you for the referral.  [facility] 08/10/2011 2:00 PM  Patient seen and examined. All of records from [facility], this admission and prior information in this EMR back to 2008 reviewed. Peripheral blood smear reviewed as above. I have edited all of above note by PA.   I am not certain at all that this is TTP, tho if he tolerates the plasmaphoresis today that is not  unreasonable to try given rest of very critical situation.   Weighing against TTP: no significant schistocytes on smear now, other etiologies for change in MS (ETOH, shock, head trauma), other etiologies for decrease in renal function (baseline renal insufficiency, hypotension, elevated CK), other possible etiologies for low platelets (ETOH marrow suppression, possible cirrhosis/splenomegaly -- I have ordered port abd [place] to look at liver and spleen.;he DOES NOT have to be NPO for this exam, preexisting mild thrombocytopenia 2010). He does not appear to be hemolyzing and has not been febrile.  Assessment/ Recommendatiions: 1. Thrombocytopenia: etiology other than TTP seems likely. Would transfuse platelets if significant bleeding. Will repeat CBC with smear daily for now. Hold anticoagulants. 2.Anemia: iron deficiency, MCV elevated likely with ETOH but will check B12/folate. 3.acute on chronic renal failure: on CVHD 4.subdural hematoma, past CVAs, post resection of meningioma 5. Heavy daily ETOH confirmed with family now 6.chronic hepatitis C documented by liver bx 05-2008  7. Albumin 1.1  I have discussed with Dr Deterding on unit now.  I will ask my partners to follow this weekend.  Jama Flavors, MD [phone]

## 2011-08-10 NOTE — Progress Notes (Signed)
Name: [name] MRN: [id number] DOB: 1955/12/09    LOS: 3  Referring Provider:  Transferred from [facility] Reason for Referral:  Altered mental status  PULMONARY / CRITICAL CARE MEDICINE   Brief patient description:  7/9  Brought to [facility] with altered mental status.  Head CT demonstrated small right subdural hematoma with 4 mm midline shift.  Labs demonstarted multiple metabolic derangements.Transferred to [facility] for farther management.  Interval/subjective 7/10-Had a seizure ~530 am this am, treated w/ 1 mg ativan. [id number] PCCM team called to bedside for eval of Persistent hypotension not responding to volume challenge and decrease in MS. On bedside eval he is unresponsive. Will bite down when attempting to illicit gag, but has no other response to noxious stimuli. Currently SBP in 60s. Intubated. Central line placed. Transferred to ICU due to continued hypotension of unknown origin. 7/11 continuous EEG without seizure activity, asymetrical activity likely due to subdural, moderate encephalopathy-perhaps due to sedative medications. Only on intermittent sedation-infrequent use  Current status:  Intubated, opening eyes Pressors weaning to off  Vital Signs:  Reviewed  Temp:  [93.2 F (34 C)-96.4 F (35.8 C)] 96.4 F (35.8 C) (07/12 0800) Pulse Rate:  [62-82] 76  (07/12 0800) Resp:  [12-26] 17  (07/12 0800) BP: (77-111)/(62-85) 105/77 mmHg (07/12 0800) SpO2:  [94 %-100 %] 100 % (07/12 0800) FiO2 (%):  [30 %-30.8 %] 30.4 % (07/12 0800)  Intake/Output Summary (Last 24 hours) at 08/10/11 0840 Last data filed at 08/10/11 0802  Gross per 24 hour  Intake 4831.92 ml  Output   4315 ml  Net 516.92 ml     Physical Examination: General:  Intubated Neuro:  Opens eyes to stimulation, does not follow commands HEENT:  PERRL Neck:  Supple, no JVD Cardiovascular:  RRR, no murmurs Lungs:  CTAB Abdomen:  Soft, nontender, bowel sounds present Musculoskeletal:  no edema Skin:  Intact  Active  Problems:  NEOP, BNG, CEREBRAL MENINGES  HYPERTENSION, BENIGN ESSENTIAL  HYPERTENSION, KIDNEY DISEASE, UNCONTROLLED  SEIZURE DISORDER  Encephalopathy acute  Hepatitis C without mention of hepatic coma  Diastolic heart failure  Subdural hematoma  Hypokalemia  Thrombocytopenia  Acidosis  Anemia  Malnutrition  Acute respiratory failure  Shock  Altered mental status  AKI (acute kidney injury)  ASSESSMENT AND PLAN  PULMONARY  Lab 08/10/11 0417 08/09/11 0454 08/08/11 0400 08/07/11 1013 08/07/11 0455  PHART 7.466* 7.536* 7.379 7.336* 7.382  PCO2ART 30.5* 23.3* 18.6* 16.0* 13.2*  PO2ART 105.0* 125.0* 166.0* 514.0* 160.0*  HCO3 22.3 20.3 10.7* 8.3* 7.6*  O2SAT 99.0 99.0 99.8 100.0 99.6   Ventilator Settings: Vent Mode:  [-] CPAP FiO2 (%):  [30 %-30.8 %] 30.4 % Set Rate:  [14 bmp] 14 bmp Vt Set:  [600 mL] 600 mL PEEP:  [5 cmH20] 5 cmH20 Pressure Support:  [5 cmH20] 5 cmH20 Plateau Pressure:  [11 cmH20-20 cmH20] 11 cmH20 CXR: 7/11 CXR-mildly improved vascular congestion 7/12-some bilateral opacities possible atelectasis. Vascular congestion minimal.  ETT: 7/9>>>  A:  Acute Respiratory Failure in setting of Inability to protect airway s/p seizure and complicated by multiple metabolic derangements.  P:   Maintain on vent support while neurologic status-patient unable to follow commands at this time Minimal vent settings but concerned about patient's ability to protect airway Resp alkalosis improved with change in rate. Still alkalotic. On SBT at this time. Could consider turning down Vt .  CXR reviewed-minimal vascular congestion sputum culture, pending.  CARDIOVASCULAR  Lab 08/10/11 0400 08/08/11 2115 08/08/11 1545 08/07/11 1155  08/07/11 0530  TROPONINI -- -- -- -- 0.39*  LATICACIDVEN 2.3* 2.3* -- 0.8 5.3*  PROBNP -- -- 3123.0* -- --   ECG:  NA Lines:right IJ CVL 7/9>>> Left rad aline 7/9>>>  A: History of diastolic CHF, no evidence of exacerbation.  History of  hypetension. Has mild elevated Trop I. Hard to interpret in the setting of renal failure.  P:  Cardiac enzymes positive. BNP elevated but difficult to interpret in light of multiple comorbitides.  ECHO hyperdynamic with EF of 75-80%, might consider swan if hypotension remains an issue.  A:Circulatory Shock. Unclear etiology. Suspect infection vs acidosis due to renal failure as cause for his shock.  Heart is hyperdynamic by TTE. Gave additional fluid bolus yesterday of 1L due to CVP and PCWP 11 or less.  BP has improved as his acidosis has improved  P: CVP 11 yesterday and received 1L bolus. CVP 11 today, could consider further fluid repletion.   lactate trending down from 5.3 to 2.3, PCT 0.66--> 0.93 on 7/12 Levophed for MAP >65-weaned from 6 to 2 mcg/min  CVVHD per renal Bicarb started to keep ph > 7.2 initially. Bicarb gtt now off as of 7/11 Cortisol 17.4, on hydrocortisone IVF now [facility]  RENAL  Lab 08/10/11 0310 08/09/11 2110 08/09/11 1505 08/09/11 0856 08/09/11 0310 08/08/11 2115 08/08/11 0350 08/07/11 1714 08/07/11 0530  NA 138 137 139 140 142 -- -- -- --  K 3.7 3.2* -- -- -- -- -- -- --  CL 107 106 109 110 111 -- -- -- --  CO2 21 24 19 20 19  -- -- -- --  BUN 13 15 18 23  27* -- -- -- --  CREATININE 1.45* 1.49* 1.63* 2.07* 2.43* -- -- -- --  CALCIUM 6.4* 6.5* 6.4* 6.2* 5.8* -- -- -- --  MG 1.9 -- -- -- 1.7 -- 1.4* 1.5 1.9  PHOS 1.1* -- -- -- 1.6* 1.9* 1.4* 1.9* --   Intake/Output      07/11 0701 - 07/12 0700 07/12 0701 - 07/13 0700   I.V. (mL/kg) 1613.2 (19.4) 63.8 (0.8)   Other     NG/GT 1260 55   IV Piggyback 2069 55.5   Total Intake(mL/kg) 4942.2 (59.3) 174.3 (2.1)   Urine (mL/kg/hr) 25 (0)    Other 3909 124   Stool 450    Total Output 4384 124   Net +558.2 +50.3         Foley:  7/9  A:  Acute on chronic renal failure (Cr 5.5), in setting of Dehydration. CKD and renal artery stenosis.  Gap/non-gap metabolic acidosis. Likely due to renal failure and hypoperfusion.  Acidosis now resolved.  P: Appreciate renal recommendations.  Due to CVVHD CVP monitoring and levophed to ensure adequate filling pressures and MAP.  7/9 Renal US -chronic medical renal disease.  Close I&O -positive Renal following potassium and repleting. Also repleting phosphorus.   GASTROINTESTINAL  Lab 08/10/11 0310 08/09/11 0310 08/08/11 2115 08/08/11 0350 08/07/11 0530  AST 30 32 -- 42* 49*  ALT 27 28 -- 27 33  ALKPHOS 93 86 -- 76 91  BILITOT 0.4 0.6 -- 0.7 0.5  PROT 4.9* 4.6* -- 4.3* 5.0*  ALBUMIN 1.1* 1.1* 1.1* 1.1* 1.4*    A:  History of hepatitis C.  Suspected malnutrition (Alb 1.4-->1.1). P:   Tube feeds PPI  HEMATOLOGIC  Lab 08/10/11 0310 08/09/11 1102 08/09/11 0856 08/09/11 0310 08/08/11 2115 08/08/11 0350 08/07/11 1855 08/07/11 0530  HGB 8.6* -- -- 8.7* --  8.1* 7.8* 7.5*  HCT 24.4* -- -- 23.8* -- 22.1* 21.3* 20.7*  PLT 16* 23* -- 27* -- 61* -- 60*  INR -- 1.17 1.30 -- 1.33 -- -- 1.23  APTT 75* 188* >200* 165* 44* -- -- --   A:  Mild anemia, macrocitic (Hb 8.0, MCV 109).  Thrombocytopenia (Plt 60 >> 27>> 16).  Clinical syndrome now seems most consistent with TTP, especially with schistocytes on smear and negative DIC panel. He has been rx with stress dose steroids P:  Suspect we need to empirically treat TTP with plasmaphoresis; will review case with Dr Darrick Penna. Will change the steroids from hydrocort to therapeutic dose solumedrol HIT panel pending.  CBC-hgb stable, haptoglobin normal but LDH elevated, no clear evidence hemolysis  INFECTIOUS  Lab 08/10/11 0310 08/09/11 0310 08/08/11 0350 08/07/11 1155 08/07/11 0530  WBC 20.1* 19.7* 16.3* -- 9.6  PROCALCITON 0.93 -- 0.66 0.66 0.99   Cultures: BCX2 7/9>>>NTD Sputum 7/9>>>NTD  Antibiotics: Vanc 7/10>>> Zosyn 7/10 >>   A:  No evidence of acute infection but patient recovering from shock P:    PCT 0.66--> minimal elevation empiric abx as ordered WBC continues to elevate. Unclear source of  infection.   ENDOCRINE  Lab 08/08/11 1957 08/07/11 0424  GLUCAP 152* 88   A:  No active issues P:   No intervention required  NEUROLOGIC  7/9  Head CT >>> Small right subdural hematoma with 4 mm midline shift.   A:  Acute encephalopathy.  History of alcohol abuse.  History of meningioma resection.  History of seizure disorder on Dilantin.  History of medical noncompliance. Had witnessed seizure in ICU. Post-ictal 7/11.  Suspect his altered MS related to TTP P:   Neurosurgery following >> no role intervention at this time. Continuous EEG -no signs recurrent seizure.  Dilantin, Keppra continued with Q3d dilantin level. 7/12 ordered dilantin level Dilantin per pharmacy. Thiamine & Folate. Continue Lexapro. Will treat for TTP as above  Tana Conch, MD, PGY2 08/09/2011 10:09 AM   Attending Addendum:  I have seen the patient, discussed the issues, test results and plans with Dr Durene Cal. I agree with the Assessment and Plans as ammended above.   Levy Pupa, MD, PhD 08/10/2011, 8:26 AM [facility] [phone] or if no answer [phone]

## 2011-08-10 NOTE — Progress Notes (Signed)
Patient ID: [name], male   DOB: 11-30-1955, 56 y.o.   MRN: [id number] Discussed with Dr. Delton Coombes and feel TTP a signif liklihood.  Will do TPE (interrupt CVVHD while doing).  Caution with Citrate and acid/base, in setting of liver disease.

## 2011-08-10 NOTE — Procedures (Signed)
EEG NUMBER:  [id number].  BEGINNING TIME:  August 09, 2011, at 9:00 a.m.  ENDING TIME:  August 09, 2011, at 12:00 p.m.  This prolonged intensive telemetry EEG with simultaneous video monitoring was requested in this 56 year old male as part of ongoing series to monitor for electrographic seizure activity.  Listed medications include Versed, Dilantin, and fentanyl.  Background activities were composed of low-to-medium amplitude, polymorphic delta activities that were of lower amplitude over the right hemisphere.  Higher frequency activities particularly in the alpha and beta range were seen in the frontal regions.  Again, these were better seen over the left hemisphere.  Periods of increased alertness were associated with increased amplitude of the delta activities.  Review of the entirety of the tracing did not reveal any electrographic seizures.  CLINICAL INTERPRETATION:  This prolonged intensive telemetry EEG with simultaneous video monitoring captured a period without clinical events of interest.  No electrographic seizures were seen during this time.          ______________________________ Denton Meek, MD    [id number] D:  08/09/2011 22:21:46  T:  08/09/2011 22:45:46  Job #:  [id number]

## 2011-08-10 NOTE — Progress Notes (Signed)
Nutrition Follow-up  Intervention:    To better meet re-estimated needs, decrease Osmolite 1.2 to 40 ml/h with Pro-stat 60 ml TID to provide 1752 kcals, 143 grams protein, 787 ml free water daily.  Assessment:    Patient remains on ventilator support for acute respiratory failure with inability to protect airway s/p witnessed seizure in ICU and complicated by multiple metabolic derangements.  Is now receiving CVVHD for acute on chronic renal failure.  MV: 10.3 Temp: 36 C  Patient has nasoenteric feeding tube in place with tip of tube near the fundus. Osmolite 1.2 is infusing @ 55 ml/hr with 30 ml Prostat via tube daily. Tube feeding regimen currently providing 1684 kcal, 88 grams protein, and 1082 ml free water daily.   No family in room.   Diet Order:  NPO  Meds: Scheduled Meds:    . antiseptic oral rinse  15 mL Mouth Rinse QID  . chlorhexidine  15 mL Mouth/Throat BID  . escitalopram  20 mg Oral Daily  . feeding supplement  30 mL Per Tube Daily  . fentaNYL      . folic acid  1 mg Oral Daily  . hydrocortisone sodium succinate  50 mg Intravenous Q6H  . levetiracetam  500 mg Intravenous Q12H  . pantoprazole (PROTONIX) IV  40 mg Intravenous Q24H  . phenytoin (DILANTIN) IV  200 mg Intravenous Q12H  . piperacillin-tazobactam (ZOSYN)  IV  3.375 g Intravenous Q6H  . potassium chloride  40 mEq Per Tube Once  . sodium chloride  1,000 mL Intravenous Once  . sodium phosphate  Dextrose 5% IVPB  30 mmol Intravenous Once  . thiamine  100 mg Oral Daily  . vancomycin  1,000 mg Intravenous Q24H  . DISCONTD: piperacillin-tazobactam (ZOSYN)  IV  2.25 g Intravenous Q8H  . DISCONTD: piperacillin-tazobactam (ZOSYN)  IV  3.375 g Intravenous Q6H  . DISCONTD: vancomycin  1,000 mg Intravenous Q48H   Continuous Infusions:    . feeding supplement (OSMOLITE 1.2 CAL) 1,000 mL (08/09/11 1748)  . heparin 10,000 units/ 20 mL infusion syringe Stopped (08/09/11 0829)  . midazolam (VERSED) infusion 5  mg/hr (08/08/11 1200)  . norepinephrine (LEVOPHED) Adult infusion Stopped (08/10/11 0928)  . dialysis replacement fluid (prismasate) 700 mL/hr at 08/10/11 0405  . dialysis replacement fluid (prismasate) 300 mL/hr at 08/10/11 0931  . dialysate (PRISMASATE) 2,000 mL/hr at 08/10/11 0931   PRN Meds:.sodium chloride, fentaNYL, heparin, midazolam, sodium chloride  Labs:  CMP     Component Value Date/Time   NA 138 08/10/2011 0310   K 3.7 08/10/2011 0310   CL 107 08/10/2011 0310   CO2 21 08/10/2011 0310   GLUCOSE 189* 08/10/2011 0310   BUN 13 08/10/2011 0310   CREATININE 1.45* 08/10/2011 0310   CALCIUM 6.4* 08/10/2011 0310   PROT 4.9* 08/10/2011 0310   ALBUMIN 1.1* 08/10/2011 0310   AST 30 08/10/2011 0310   ALT 27 08/10/2011 0310   ALKPHOS 93 08/10/2011 0310   BILITOT 0.4 08/10/2011 0310   GFRNONAA 53* 08/10/2011 0310   GFRAA 61* 08/10/2011 0310   Phosphorus  Date/Time Value Range Status  08/10/2011  3:10 AM 1.1* 2.3 - 4.6 mg/dL Final  1/61/0960  4:54 AM 1.6* 2.3 - 4.6 mg/dL Final  0/98/1191  4:78 PM 1.9* 2.3 - 4.6 mg/dL Final   Magnesium  Date/Time Value Range Status  08/10/2011  3:10 AM 1.9  1.5 - 2.5 mg/dL Final  2/95/6213  0:86 AM 1.7  1.5 - 2.5 mg/dL Final  5/78/4696  3:50 AM 1.4* 1.5 - 2.5 mg/dL Final   Ammonia  Date/Time Value Range Status  08/07/2011  5:30 AM 126* 11 - 60 umol/L Final    Intake/Output Summary (Last 24 hours) at 08/10/11 1107 Last data filed at 08/10/11 1102  Gross per 24 hour  Intake 4937.22 ml  Output   4080 ml  Net 857.22 ml   Weight Status:   83.3 kg up from 82 kg on 7/10 and 69.3 kg on 7/9  Protein needs are increased with CVVHD. Re-estimated needs:  1700 kcal; 135-145 grams protein daily  Nutrition Dx:  Inadequate oral intake r/t inability to eat AEB NPO status; ongoing.  Goal: Pt to meet >/= 90% of their estimated nutrition needs; not met.  Monitor:  TF tolerance, vent status, labs, renal function, weight trend   Joaquin Courts, RD, CNSC,  LDN Pager# [phone] After Hours Pager# [phone]

## 2011-08-10 NOTE — Progress Notes (Signed)
Subjective: Patient reports Significantly more awake today eyes are open he stable track   Objective: Vital signs in last 24 hours: Temp:  [93.9 F (34.4 C)-96.8 F (36 C)] 96.3 F (35.7 C) (07/12 1200) Pulse Rate:  [64-82] 76  (07/12 1330) Resp:  [10-26] 12  (07/12 1330) BP: (77-107)/(58-85) 87/71 mmHg (07/12 1300) SpO2:  [94 %-100 %] 100 % (07/12 1330) FiO2 (%):  [30 %-30.8 %] 30.5 % (07/12 1330)  Intake/Output from previous day: 07/11 0701 - 07/12 0700 In: 4942.2 [I.V.:1613.2; NG/GT:1260; IV Piggyback:2069] Out: 4384 [Urine:25; Stool:450] Intake/Output this shift: Total I/O In: 1120.1 [I.V.:327.6; NG/GT:390; IV Piggyback:402.5] Out: 1193 [Other:993; Stool:200]  Eyes open to voice he is awake he is alert he tracks well as not able again to follow commands however  Lab Results:  Basename 08/10/11 0310 08/09/11 1102 08/09/11 0310  WBC 20.1* -- 19.7*  HGB 8.6* -- 8.7*  HCT 24.4* -- 23.8*  PLT 16* 23* --   BMET  Basename 08/10/11 0310 08/09/11 2110  NA 138 137  K 3.7 3.2*  CL 107 106  CO2 21 24  GLUCOSE 189* 157*  BUN 13 15  CREATININE 1.45* 1.49*  CALCIUM 6.4* 6.5*    Studies/Results: Dg Chest Port 1 View  08/10/2011  *RADIOLOGY REPORT*  Clinical Data: 56 year old male with seizure disorder, encephalopathy, acute respiratory failure.  Intubated.  PORTABLE CHEST - 1 VIEW  Comparison: 08/09/2011 and earlier.  Findings: Portable semi upright AP view at 0537 hours. Endotracheal tube tip in stable position between the level of clavicles and carina.  Left subclavian approach Swan-Ganz catheter remains in place, tip is at the level of the right lower lobe pulmonary artery.  Stable right IJ central line.  Enteric feeding tube courses to the abdomen, tip not definitely identified.  Continued low lung volumes.  No pulmonary edema.  No definite pleural effusion.  Patchy bibasilar opacity is stable and most resembles atelectasis.  IMPRESSION: 1.  Swan-Ganz catheter tip at the  right lower lobe pulmonary artery level. 2.  Otherwise, stable lines and tubes. 3.  Stable mild atelectasis.  Original Report Authenticated By: Harley Hallmark, M.D.   Dg Chest Port 1 View  08/09/2011  *RADIOLOGY REPORT*  Clinical Data: To evaluate endotracheal tube  PORTABLE CHEST - 1 VIEW  Comparison: 08/08/2011.  Findings: Endotracheal tube tip 3.9 cm above the carina.  Right central line tip mid superior vena cava level.  Left Swan-Ganz catheter is in place with the tip projecting at the level of the distal aspect of the right main pulmonary artery/right lower lobe pulmonary artery junction.  No gross pneumothorax.  Cardiomegaly.  Tortuous aorta.  Central pulmonary vascular prominence.  Left base subsegmental atelectasis.  IMPRESSION: Endotracheal tube tip 3.9 cm above the carina.  Right central line tip mid superior vena cava level.  Left Swan-Ganz catheter is in place with the tip projecting at the level of the distal aspect of the right main pulmonary artery/right lower lobe pulmonary artery junction.  No gross pneumothorax.  Cardiomegaly.  Tortuous aorta.  Central pulmonary vascular prominence.  Left base subsegmental atelectasis.  Original Report Authenticated By: Fuller Canada, M.D.   Dg Chest Port 1 View  08/08/2011  *RADIOLOGY REPORT*  Clinical Data: Swan-Ganz placement  PORTABLE CHEST - 1 VIEW  Comparison: Same day  Findings: Endotracheal tube has its tip 2 cm above the carina. Soft feeding tube enters the abdomen.  Swan-Ganz catheter has its tip in the right main pulmonary artery.  Right  internal jugular central line has its tip at the SVC/RA junction.  No pneumothorax. There is mild basilar atelectasis.  IMPRESSION: Lines and tubes well positioned.  Swan-Ganz catheter tip in the right main pulmonary artery, directed towards the right middle or lower lobe branch.  Original Report Authenticated By: Thomasenia Sales, M.D.    Assessment/Plan: Significantly improved much more awake and alert today  results of the EEG noted I still do not feel that the small 4 mm subdural fluid collection should be evacuated as it is not causing any significant mass effect. We'll continue to follow  LOS: 3 days     Korry Dalgleish P 08/10/2011, 1:42 PM

## 2011-08-10 NOTE — Procedures (Signed)
TPE starting via Trialysis cath.

## 2011-08-10 NOTE — Progress Notes (Signed)
Heme Onc  Addendum to consult note: time spent 2 hrs.  Jama Flavors, MD

## 2011-08-11 ENCOUNTER — Inpatient Hospital Stay (HOSPITAL_COMMUNITY): Payer: Medicare Other

## 2011-08-11 DIAGNOSIS — M311 Thrombotic microangiopathy: Secondary | ICD-10-CM

## 2011-08-11 DIAGNOSIS — D638 Anemia in other chronic diseases classified elsewhere: Secondary | ICD-10-CM

## 2011-08-11 LAB — TYPE AND SCREEN: Unit division: 0

## 2011-08-11 LAB — THERAPEUTIC PLASMA EXCHANGE (BLOOD BANK)
Plasma Exchange: 4000
Unit division: 0
Unit division: 0
Unit division: 0
Unit division: 0
Unit division: 0
Unit division: 0
Unit division: 0

## 2011-08-11 LAB — CBC
MCH: 31.6 pg (ref 26.0–34.0)
Platelets: 9 10*3/uL — CL (ref 150–400)
RBC: 2.37 MIL/uL — ABNORMAL LOW (ref 4.22–5.81)
RDW: 21.1 % — ABNORMAL HIGH (ref 11.5–15.5)

## 2011-08-11 LAB — CBC WITH DIFFERENTIAL/PLATELET
Basophils Absolute: 0 10*3/uL (ref 0.0–0.1)
Lymphs Abs: 1 10*3/uL (ref 0.7–4.0)
MCV: 97.8 fL (ref 78.0–100.0)
Monocytes Relative: 7 % (ref 3–12)
Platelets: 7 10*3/uL — CL (ref 150–400)
RDW: 19.2 % — ABNORMAL HIGH (ref 11.5–15.5)
WBC: 10.8 10*3/uL — ABNORMAL HIGH (ref 4.0–10.5)

## 2011-08-11 LAB — COMPREHENSIVE METABOLIC PANEL
ALT: 21 U/L (ref 0–53)
BUN: 12 mg/dL (ref 6–23)
CO2: 28 mEq/L (ref 19–32)
Calcium: 6.9 mg/dL — ABNORMAL LOW (ref 8.4–10.5)
Creatinine, Ser: 1.47 mg/dL — ABNORMAL HIGH (ref 0.50–1.35)
GFR calc Af Amer: 60 mL/min — ABNORMAL LOW (ref >90)
GFR calc non Af Amer: 52 mL/min — ABNORMAL LOW (ref >90)
Glucose, Bld: 129 mg/dL — ABNORMAL HIGH (ref 70–99)

## 2011-08-11 LAB — RENAL FUNCTION PANEL
Albumin: 2.8 g/dL — ABNORMAL LOW (ref 3.5–5.2)
Chloride: 104 mEq/L (ref 96–112)
Creatinine, Ser: 1.6 mg/dL — ABNORMAL HIGH (ref 0.50–1.35)
GFR calc non Af Amer: 47 mL/min — ABNORMAL LOW (ref >90)
Potassium: 3.1 mEq/L — ABNORMAL LOW (ref 3.5–5.1)

## 2011-08-11 LAB — PHENYTOIN LEVEL, TOTAL: Phenytoin Lvl: 15.6 ug/mL (ref 10.0–20.0)

## 2011-08-11 LAB — PREPARE RBC (CROSSMATCH)

## 2011-08-11 LAB — PROTIME-INR: INR: 1.07 (ref 0.00–1.49)

## 2011-08-11 LAB — CALCIUM: Calcium: 7.3 mg/dL — ABNORMAL LOW (ref 8.4–10.5)

## 2011-08-11 LAB — DIRECT ANTIGLOBULIN TEST (NOT AT ARMC): DAT, complement: NEGATIVE

## 2011-08-11 MED ORDER — CALCIUM GLUCONATE 10 % IV SOLN: 2.0000 g | INTRAVENOUS | Status: DC | PRN

## 2011-08-11 MED ORDER — ACD FORMULA A 0.73-2.45-2.2 GM/100ML VI SOLN: 500.0000 mL | Status: DC

## 2011-08-11 MED ORDER — SODIUM CHLORIDE 0.9 % IV SOLN: 4.0000 g | INTRAVENOUS | Status: DC | PRN

## 2011-08-11 MED ORDER — POTASSIUM CHLORIDE 10 MEQ/50ML IV SOLN
10.0000 meq | INTRAVENOUS | Status: AC
  Administered 2011-08-11 (×2): 10 meq via INTRAVENOUS
  Filled 2011-08-11 (×3): qty 50

## 2011-08-11 MED ORDER — POTASSIUM CHLORIDE 20 MEQ/15ML (10%) PO LIQD
40.0000 meq | Freq: Once | ORAL | Status: AC
  Administered 2011-08-11: 40 meq
  Filled 2011-08-11: qty 30

## 2011-08-11 MED ORDER — SODIUM CHLORIDE 0.9 % IV SOLN: 2.0000 g | INTRAVENOUS | Status: DC | PRN

## 2011-08-11 MED ORDER — CALCIUM GLUCONATE 10 % IV SOLN: 2.0000 g | Freq: Once | INTRAVENOUS | Status: DC

## 2011-08-11 MED ORDER — ANTICOAGULANT SODIUM CITRATE 4% (200MG/5ML) IV SOLN: 5.0000 mL | Freq: Once | Status: DC

## 2011-08-11 MED ORDER — DIPHENHYDRAMINE HCL 25 MG PO CAPS: 25.0000 mg | ORAL_CAPSULE | Freq: Four times a day (QID) | ORAL | Status: DC | PRN

## 2011-08-11 MED ORDER — POTASSIUM CHLORIDE 10 MEQ/50ML IV SOLN
INTRAVENOUS | Status: AC
  Administered 2011-08-11: 10 meq
  Filled 2011-08-11: qty 50

## 2011-08-11 MED ORDER — ACETAMINOPHEN 325 MG PO TABS: 650.0000 mg | ORAL_TABLET | ORAL | Status: DC | PRN

## 2011-08-11 MED ORDER — ACD FORMULA A 0.73-2.45-2.2 GM/100ML VI SOLN
Status: AC
  Filled 2011-08-11: qty 500

## 2011-08-11 MED ORDER — POTASSIUM CHLORIDE 10 MEQ/100ML IV SOLN: 10.0000 meq | INTRAVENOUS | Status: DC

## 2011-08-11 NOTE — Progress Notes (Signed)
Subjective: Interval History: none.  Objective: Vital signs in last 24 hours: Temp:  [96.3 F (35.7 C)-98.3 F (36.8 C)] 97.5 F (36.4 C) (07/13 0418) Pulse Rate:  [70-111] 87  (07/13 0700) Resp:  [10-20] 14  (07/13 0600) BP: (83-120)/(52-76) 97/71 mmHg (07/13 0700) SpO2:  [100 %] 100 % (07/12 2000) FiO2 (%):  [30 %-30.7 %] 30.5 % (07/13 0600) Weight:  [83.6 kg (184 lb 4.9 oz)] 83.6 kg (184 lb 4.9 oz) (07/13 0500) Weight change:   Intake/Output from previous day: 07/12 0701 - 07/13 0700 In: 3013.1 [I.V.:1007.6; NG/GT:1255; IV Piggyback:750.5] Out: 2341 [Stool:200] Intake/Output this shift:    General appearance: sedated, on vent, ^ tone UE Resp: coarse bs, rales in bases, scattered rhonchi Cardio: S1, S2 normal and systolic murmur: holosystolic 2/6, blowing at apex GI: no bs, firm, mild distension Extremities: edema 3+  Lab Results:  [facility] 08/11/11 0434 08/10/11 0310  WBC 10.8* 20.1*  HGB 5.9* 8.6*  HCT 17.4* 24.4*  PLT 7* 16*   BMET:  Basename 08/11/11 0434 08/10/11 0310  NA 142 138  K 3.2* 3.7  CL 108 107  CO2 28 21  GLUCOSE 129* 189*  BUN 12 13  CREATININE 1.47* 1.45*  CALCIUM 6.9* 6.4*    Basename 08/08/11 2115  PTH 201.5*   Iron Studies:  Basename 08/08/11 2115  IRON <10*  TIBC Not calculated due to Iron <10.  TRANSFERRIN --  FERRITIN --    Studies/Results: Dg Chest Port 1 View  08/10/2011  *RADIOLOGY REPORT*  Clinical Data: 56 year old male with seizure disorder, encephalopathy, acute respiratory failure.  Intubated.  PORTABLE CHEST - 1 VIEW  Comparison: 08/09/2011 and earlier.  Findings: Portable semi upright AP view at 0537 hours. Endotracheal tube tip in stable position between the level of clavicles and carina.  Left subclavian approach Swan-Ganz catheter remains in place, tip is at the level of the right lower lobe pulmonary artery.  Stable right IJ central line.  Enteric feeding tube courses to the abdomen, tip not definitely identified.   Continued low lung volumes.  No pulmonary edema.  No definite pleural effusion.  Patchy bibasilar opacity is stable and most resembles atelectasis.  IMPRESSION: 1.  Swan-Ganz catheter tip at the right lower lobe pulmonary artery level. 2.  Otherwise, stable lines and tubes. 3.  Stable mild atelectasis.  Original Report Authenticated By: Harley Hallmark, M.D.    I have reviewed the patient's current medications.  Assessment/Plan: 1 AKI/CKD no urine.  Acid /base ok.  Needs K. Vol xs but with soft BP and FIO2 ok, keep even. 2 Ptlt falling again cont TPE 3 Anemia low HB, give PRBC 4 Nutition no bs, TF on hold 5 Liver dz 6 SDH 7 Neuro status ??? P CVVHD, TPE, Transfuse, recheck Ptlt, AB    LOS: 4 days   [name] 08/11/2011,8:04 AM

## 2011-08-11 NOTE — Progress Notes (Signed)
Subjective: Patient reports More awake and alert today  Objective: Vital signs in last 24 hours: Temp:  [96.3 F (35.7 C)-98.3 F (36.8 C)] 97.8 F (36.6 C) (07/13 1045) Pulse Rate:  [70-111] 72  (07/13 1045) Resp:  [10-20] 12  (07/13 1045) BP: (83-120)/(52-76) 93/66 mmHg (07/13 0815) SpO2:  [100 %] 100 % (07/12 2000) FiO2 (%):  [30 %-30.7 %] 30 % (07/13 1041) Weight:  [83.6 kg (184 lb 4.9 oz)] 83.6 kg (184 lb 4.9 oz) (07/13 0500)  Intake/Output from previous day: 07/12 0701 - 07/13 0700 In: 3013.1 [I.V.:1007.6; NG/GT:1255; IV Piggyback:750.5] Out: 2341 [Stool:200] Intake/Output this shift: Total I/O In: 15 [Blood:15] Out: 189 [Other:189]  Patient is more awake regards tracks well attempt to follow commands but not definitively so  Lab Results:  Basename 08/11/11 0434 08/10/11 0310  WBC 10.8* 20.1*  HGB 5.9* 8.6*  HCT 17.4* 24.4*  PLT 7* 16*   BMET  Basename 08/11/11 0434 08/10/11 0310  NA 142 138  K 3.2* 3.7  CL 108 107  CO2 28 21  GLUCOSE 129* 189*  BUN 12 13  CREATININE 1.47* 1.45*  CALCIUM 6.9* 6.4*    Studies/Results: Dg Chest Port 1 View  08/11/2011  *RADIOLOGY REPORT*  Clinical Data: Endotracheal tube placement.  PORTABLE CHEST - 1 VIEW  Comparison: 08/10/2011  Findings: Endotracheal tube is 5.3 cm above the carina.  There are low lung volumes and bibasilar atelectasis.  Heart size is stable. Feeding tube extends into the abdomen.  Jugular central venous catheter in the SVC region. Left subclavian central line is still present but the pulmonary arterial catheter has been removed.  IMPRESSION: Low lung volumes with bibasilar atelectasis.  Support apparatuses as described.  Original Report Authenticated By: Richarda Overlie, M.D.   Dg Chest Port 1 View  08/10/2011  *RADIOLOGY REPORT*  Clinical Data: 56 year old male with seizure disorder, encephalopathy, acute respiratory failure.  Intubated.  PORTABLE CHEST - 1 VIEW  Comparison: 08/09/2011 and earlier.  Findings:  Portable semi upright AP view at 0537 hours. Endotracheal tube tip in stable position between the level of clavicles and carina.  Left subclavian approach Swan-Ganz catheter remains in place, tip is at the level of the right lower lobe pulmonary artery.  Stable right IJ central line.  Enteric feeding tube courses to the abdomen, tip not definitely identified.  Continued low lung volumes.  No pulmonary edema.  No definite pleural effusion.  Patchy bibasilar opacity is stable and most resembles atelectasis.  IMPRESSION: 1.  Swan-Ganz catheter tip at the right lower lobe pulmonary artery level. 2.  Otherwise, stable lines and tubes. 3.  Stable mild atelectasis.  Original Report Authenticated By: Harley Hallmark, M.D.    Assessment/Plan: Neurologically patient is improving is much more awake and interactive I still cannot can follow commands care his help patient currently still from the setting at a significant risk of spontaneous hemorrhage I think we do not need to necessarily repeat a CT of his head was cc of neurologic change dura remained small and I do not think is a large component of his encephalopathy.  LOS: 4 days     Raelyn Racette P 08/11/2011, 10:57 AM

## 2011-08-11 NOTE — Procedures (Signed)
PE begun without diff per temp Trialysis cath.  Off CVVHD for access.

## 2011-08-11 NOTE — Progress Notes (Signed)
Name: [name] MRN: [id number] DOB: 09/06/1955    LOS: 4  Referring Provider:  Transferred from [facility] Reason for Referral:  Altered mental status  PULMONARY / CRITICAL CARE MEDICINE   Brief patient description:  7/9  Brought to [facility] with altered mental status.  Head CT demonstrated small right subdural hematoma with 4 mm midline shift.  Labs demonstarted multiple metabolic derangements.Transferred to [facility] for farther management.  Interval/subjective 7/10-Had a seizure ~530 am this am, treated w/ 1 mg ativan. [id number] PCCM team called to bedside for eval of Persistent hypotension not responding to volume challenge and decrease in MS. On bedside eval he is unresponsive. Will bite down when attempting to illicit gag, but has no other response to noxious stimuli. Currently SBP in 60s. Intubated. Central line placed. Transferred to ICU due to continued hypotension of unknown origin. 7/11 continuous EEG without seizure activity, asymetrical activity likely due to subdural, moderate encephalopathy-perhaps due to sedative medications. Only on intermittent sedation-infrequent use 7/12 Plasmapheresis started due to concern for TTP; pressors weaned off  Current status:  Starting to wake up some - following commands intermittently Platelets down to 7K, Hgb 8.6 >> 5.9  Vital Signs:  Reviewed  Temp:  [96.3 F (35.7 C)-98.3 F (36.8 C)] 97.9 F (36.6 C) (07/13 0806) Pulse Rate:  [70-111] 80  (07/13 0815) Resp:  [10-20] 10  (07/13 0815) BP: (83-120)/(52-76) 93/66 mmHg (07/13 0815) SpO2:  [100 %] 100 % (07/12 2000) FiO2 (%):  [30 %-40 %] 40 % (07/13 0815) Weight:  [184 lb 4.9 oz (83.6 kg)] 184 lb 4.9 oz (83.6 kg) (07/13 0500)  Intake/Output Summary (Last 24 hours) at 08/11/11 1010 Last data filed at 08/11/11 0900  Gross per 24 hour  Intake   2477 ml  Output   1844 ml  Net    633 ml     Physical Examination: General:  Intubated Neuro:  Opens eyes to stimulation, follows some  commands HEENT:  PERRL Neck:  Supple, no JVD Cardiovascular:  RRR, no murmurs Lungs:  CTAB Abdomen:  Soft, nontender, bowel sounds present Musculoskeletal:  no edema Skin:  Intact  Active Problems:  NEOP, BNG, CEREBRAL MENINGES  HYPERTENSION, BENIGN ESSENTIAL  HYPERTENSION, KIDNEY DISEASE, UNCONTROLLED  SEIZURE DISORDER  Encephalopathy acute  Hepatitis C without mention of hepatic coma  Diastolic heart failure  Subdural hematoma  Hypokalemia  Thrombocytopenia  Acidosis  Anemia  Malnutrition  Acute respiratory failure  Shock  Altered mental status  AKI (acute kidney injury)  ASSESSMENT AND PLAN  PULMONARY  Lab 08/10/11 0417 08/09/11 0454 08/08/11 0400 08/07/11 1013 08/07/11 0455  PHART 7.466* 7.536* 7.379 7.336* 7.382  PCO2ART 30.5* 23.3* 18.6* 16.0* 13.2*  PO2ART 105.0* 125.0* 166.0* 514.0* 160.0*  HCO3 22.3 20.3 10.7* 8.3* 7.6*  O2SAT 99.0 99.0 99.8 100.0 99.6   Ventilator Settings: Vent Mode:  [-] PSV FiO2 (%):  [30 %-40 %] 40 % Set Rate:  [14 bmp] 14 bmp Vt Set:  [600 mL] 600 mL PEEP:  [5 cmH20] 5 cmH20 Pressure Support:  [8 cmH20] 8 cmH20 Plateau Pressure:  [13 cmH20-23 cmH20] 23 cmH20 CXR: 7/11 CXR-mildly improved vascular congestion 7/12-some bilateral opacities possible atelectasis. Vascular congestion minimal.  7/13 >> low volumes and basilar atx ETT: 7/9>>>  A:  Acute Respiratory Failure in setting of Inability to protect airway s/p seizure and complicated by multiple metabolic derangements.  P:   Maintain on vent support while neurologic status- Minimal vent settings but concerned about patient's ability to protect airway  On SBT at this time. CXR reviewed-minimal vascular congestion sputum culture, pending.  CARDIOVASCULAR  Lab 08/10/11 0400 08/08/11 2115 08/08/11 1545 08/07/11 1155 08/07/11 0530  TROPONINI -- -- -- -- 0.39*  LATICACIDVEN 2.3* 2.3* -- 0.8 5.3*  PROBNP -- -- 3123.0* -- --   ECG:  NA Lines:right IJ CVL 7/9>>> Left rad aline  7/9>>> PA-cath 7/10 >> 7/12  A: History of diastolic CHF, no evidence of exacerbation.  History of hypetension. Has mild elevated Trop I. Hard to interpret in the setting of renal failure.  P:  Cardiac enzymes positive. BNP elevated but difficult to interpret in light of multiple comorbitides.  ECHO hyperdynamic with EF of 75-80%  A:Circulatory Shock. Unclear etiology. Suspect infection vs acidosis due to renal failure as cause for his shock.  Heart is hyperdynamic by TTE. BP has improved as his acidosis has improved  P: Target CVP 12 Levophed weaned to off CVVHD per renal Cortisol 17.4, on hydrocortisone IVF now [facility]  RENAL  Lab 08/11/11 0434 08/10/11 0310 08/09/11 2110 08/09/11 1505 08/09/11 0856 08/09/11 0310 08/08/11 2115 08/08/11 0350 08/07/11 1714  NA 142 138 137 139 140 -- -- -- --  K 3.2* 3.7 -- -- -- -- -- -- --  CL 108 107 106 109 110 -- -- -- --  CO2 28 21 24 19 20  -- -- -- --  BUN 12 13 15 18 23  -- -- -- --  CREATININE 1.47* 1.45* 1.49* 1.63* 2.07* -- -- -- --  CALCIUM 6.9* 6.4* 6.5* 6.4* 6.2* -- -- -- --  MG 2.0 1.9 -- -- -- 1.7 -- 1.4* 1.5  PHOS 1.9* 1.1* -- -- -- 1.6* 1.9* 1.4* --   Intake/Output      07/12 0701 - 07/13 0700 07/13 0701 - 07/14 0700   I.V. (mL/kg) 1007.6 (12.1)    NG/GT 1255    IV Piggyback 750.5    Total Intake(mL/kg) 3013.1 (36)    Urine (mL/kg/hr)     Other 2141 189   Stool 200    Total Output 2341 189   Net +672.1 -189         Foley:  7/9  A:  Acute on chronic renal failure in setting of Dehydration and suspected TTP. CKD and renal artery stenosis.  Gap/non-gap metabolic acidosis.  P: Appreciate renal recommendations. CVVHD as ordered 7/9 Renal US -chronic medical renal disease.  Renal following potassium and repleting. Also repleting phosphorus.   GASTROINTESTINAL  Lab 08/11/11 0434 08/10/11 0310 08/09/11 0310 08/08/11 2115 08/08/11 0350 08/07/11 0530  AST 28 30 32 -- 42* 49*  ALT 21 27 28  -- 27 33  ALKPHOS 61 93 86 -- 76 91   BILITOT 0.4 0.4 0.6 -- 0.7 0.5  PROT 5.0* 4.9* 4.6* -- 4.3* 5.0*  ALBUMIN 2.2* 1.1* 1.1* 1.1* 1.1* --    A:  History of hepatitis C.  Suspected malnutrition (Alb 1.4-->1.1). P:   Tube feeds PPI  HEMATOLOGIC  Lab 08/11/11 0513 08/11/11 0434 08/10/11 0310 08/09/11 1102 08/09/11 0856 08/09/11 0310 08/08/11 2115 08/08/11 0350 08/07/11 1855  HGB -- 5.9* 8.6* -- -- 8.7* -- 8.1* 7.8*  HCT -- 17.4* 24.4* -- -- 23.8* -- 22.1* 21.3*  PLT -- 7* 16* 23* -- 27* -- 61* --  INR 1.08 1.07 -- 1.17 1.30 -- 1.33 -- --  APTT -- 32 75* 188* >200* 165* -- -- --   A:  Progressive anemia, macrocitic (Hb 8.0, MCV 109).  Thrombocytopenia (Plt 60 >> 27>> 16).  Clinical syndrome now seems most consistent with TTP, especially with schistocytes on smear and negative DIC panel. He has been rx with stress dose steroids P:  Empirically treat TTP with plasmaphoresis given his intracranial bleeding risk with severe thrombocytopenia; steroids ordered HIT panel pending.  PRBC's given this am for Hgb 5.9, follow serial CBC recheck haptoglobin  INFECTIOUS  Lab 08/11/11 0434 08/10/11 0310 08/09/11 0310 08/08/11 0350 08/07/11 1155 08/07/11 0530  WBC 10.8* 20.1* 19.7* 16.3* -- 9.6  PROCALCITON -- 0.93 -- 0.66 0.66 0.99   Cultures: BCX2 7/9>>>NTD Sputum 7/9>>>NTD  Antibiotics: Vanc 7/10>>> Zosyn 7/10 >>   A:  No evidence of acute infection but patient recovering from shock P:    PCT 0.66--> minimal elevation empiric abx as ordered  ENDOCRINE  Lab 08/08/11 1957 08/07/11 0424  GLUCAP 152* 88   A:  No active issues P:   No intervention required  NEUROLOGIC  7/9  Head CT >>> Small right subdural hematoma with 4 mm midline shift.   A:  Acute encephalopathy.  History of alcohol abuse.  History of meningioma resection.  History of seizure disorder on Dilantin.  History of medical noncompliance. Had witnessed seizure in ICU. Post-ictal 7/11.  Suspect his altered MS related to TTP P:   Neurosurgery  following >> no role intervention at this time. Continuous EEG -no signs recurrent seizure. Plan for repeat CT head if his MS changes Dilantin, Keppra continued with Q3d dilantin level. Dilantin per pharmacy. Thiamine & Folate. Continue Lexapro. Will treat for TTP as above  CC time 30 minutes  Levy Pupa, MD, PhD 08/11/2011, 10:10 AM [facility] [phone] or if no answer [phone]

## 2011-08-11 NOTE — Progress Notes (Signed)
SUBJECTIVE:  Patient remains intubated.  Responds to tactile stimuli.    MEDICATIONS:  Scheduled:   . therapeutic plasma exchange solution   Dialysis Once in dialysis  . anticoagulant sodium citrate  5 mL Intracatheter Once  . antiseptic oral rinse  15 mL Mouth Rinse QID  . calcium gluconate  2 g Intravenous Once  . chlorhexidine  15 mL Mouth/Throat BID  . citrate dextrose      . escitalopram  20 mg Oral Daily  . feeding supplement (OSMOLITE 1.2 CAL)  1,000 mL Per Tube Q24H  . feeding supplement  60 mL Per Tube TID WC  . folic acid  1 mg Oral Daily  . levetiracetam  500 mg Intravenous Q12H  . methylPREDNISolone (SOLU-MEDROL) injection  125 mg Intravenous Q12H  . pantoprazole (PROTONIX) IV  40 mg Intravenous Q24H  . phenytoin (DILANTIN) IV  200 mg Intravenous Q12H  . piperacillin-tazobactam  3.375 g Intravenous Q6H  . potassium chloride  10 mEq Intravenous Q1 Hr x 4  . sodium phosphate  Dextrose 5% IVPB  30 mmol Intravenous Once  . thiamine  100 mg Oral Daily  . vancomycin  1,000 mg Intravenous Q24H  . DISCONTD: anticoagulant sodium citrate  5 mL Intracatheter Once  . DISCONTD: anticoagulant sodium citrate  5 mL Intracatheter Once  . DISCONTD: calcium gluconate  2 g Intravenous Once  . DISCONTD: calcium gluconate  2 g Intravenous Once  . DISCONTD: feeding supplement  30 mL Per Tube Daily  . DISCONTD: hydrocortisone sodium succinate  50 mg Intravenous Q6H  . DISCONTD: piperacillin-tazobactam (ZOSYN)  IV  3.375 g Intravenous Q6H  . DISCONTD: piperacillin-tazobactam  3.375 g Intravenous Q6H   Continuous:   . citrate dextrose    . citrate dextrose    . midazolam (VERSED) infusion 5 mg/hr (08/08/11 1200)  . norepinephrine (LEVOPHED) Adult infusion Stopped (08/10/11 0928)  . dialysis replacement fluid (prismasate) 700 mL/hr at 08/10/11 2200  . dialysis replacement fluid (prismasate) 300 mL/hr at 08/10/11 2200  . dialysate (PRISMASATE) 2,000 mL/hr at 08/11/11 0845  . DISCONTD:  citrate dextrose    . DISCONTD: feeding supplement (OSMOLITE 1.2 CAL) 1,000 mL (08/09/11 1748)  . DISCONTD: heparin 10,000 units/ 20 mL infusion syringe Stopped (08/09/11 0829)  . DISCONTD: dialysis replacement fluid (prismasate) 700 mL/hr at 08/10/11 1152  . DISCONTD: dialysis replacement fluid (prismasate) 300 mL/hr at 08/10/11 0931  . DISCONTD: dialysate (PRISMASATE) 2,000 mL/hr at 08/10/11 1358   NWG:NFAOZH chloride, acetaminophen, calcium carbonate, calcium gluconate IVPB, calcium gluconate IVPB, calcium gluconate, diphenhydrAMINE, fentaNYL, midazolam, sodium chloride, DISCONTD: acetaminophen, DISCONTD: acetaminophen, DISCONTD: calcium gluconate IVPB, DISCONTD: calcium gluconate IVPB, DISCONTD: calcium gluconate IVPB, DISCONTD: calcium gluconate IVPB, DISCONTD: calcium gluconate, DISCONTD: calcium gluconate, DISCONTD: diphenhydrAMINE DISCONTD: diphenhydrAMINE, DISCONTD: heparin, DISCONTD: sodium chloride   PHYSICAL EXAM  Vital signs in last 24 hours: Temp:  [96.3 F (35.7 C)-98.3 F (36.8 C)] 97.9 F (36.6 C) (07/13 0806) Pulse Rate:  [70-111] 80  (07/13 0815) Resp:  [10-20] 10  (07/13 0815) BP: (83-120)/(52-76) 93/66 mmHg (07/13 0815) SpO2:  [100 %] 100 % (07/12 2000) FiO2 (%):  [30 %-40 %] 40 % (07/13 0815) Weight:  [184 lb 4.9 oz (83.6 kg)] 184 lb 4.9 oz (83.6 kg) (07/13 0500)  Intake/Output from previous day: 07/12 0701 - 07/13 0700 In: 3013.1 [I.V.:1007.6; NG/GT:1255; IV Piggyback:750.5] Out: 2341 [Stool:200] Intake/Output this shift:    HEENT: Normocephalic, atraumatic.  Neck supple.   Lungs- clear to percussion and auscultation.  CVS - first and  second sounds present.   Abdomen soft non tender.  Bowel sounds present.   Extremities no clubbing cyanosis 1 + edema bilaterally.  Skin - no bruising or petechial rash  CNS - does not follow commands. No bleeding at any lines or tubes.    LABS:  CBC    Component Value Date/Time   WBC 10.8* 08/11/2011 0434   RBC  1.78* 08/11/2011 0434   HGB 5.9* 08/11/2011 0434   HCT 17.4* 08/11/2011 0434   PLT 7* 08/11/2011 0434   MCV 97.8 08/11/2011 0434   MCH 33.1 08/11/2011 0434   MCHC 33.9 08/11/2011 0434   RDW 19.2* 08/11/2011 0434   LYMPHSABS 1.0 08/11/2011 0434   MONOABS 0.8 08/11/2011 0434   EOSABS 0.0 08/11/2011 0434   BASOSABS 0.0 08/11/2011 0434     Basename 08/11/11 0434 08/10/11 0310  NA 142 138  K 3.2* 3.7  CL 108 107  CO2 28 21  GLUCOSE 129* 189*  BUN 12 13  CREATININE 1.47* 1.45*  CALCIUM 6.9* 6.4*     XRAYS/RESULTS: Dg Chest Port 1 View  08/10/2011  *RADIOLOGY REPORT*  Clinical Data: 56 year old male with seizure disorder, encephalopathy, acute respiratory failure.  Intubated.  PORTABLE CHEST - 1 VIEW  Comparison: 08/09/2011 and earlier.  Findings: Portable semi upright AP view at 0537 hours. Endotracheal tube tip in stable position between the level of clavicles and carina.  Left subclavian approach Swan-Ganz catheter remains in place, tip is at the level of the right lower lobe pulmonary artery.  Stable right IJ central line.  Enteric feeding tube courses to the abdomen, tip not definitely identified.  Continued low lung volumes.  No pulmonary edema.  No definite pleural effusion.  Patchy bibasilar opacity is stable and most resembles atelectasis.  IMPRESSION: 1.  Swan-Ganz catheter tip at the right lower lobe pulmonary artery level. 2.  Otherwise, stable lines and tubes. 3.  Stable mild atelectasis.  Original Report Authenticated By: Harley Hallmark, M.D.     ASSESSMENT and PLAN: 1.  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  LDH not significantly elevated. <5 schistocytes per HPF. History of heavy ETOH use and chronic hepatitis.   2. Thrombocytopenia withno evidence of bleeding. HIT panel still pending.  Transfuse platelets as needed.  If platelets do not improve consider IVIG.  Continue with solumedrol. Consider ultrasound of abdomen to assess size of spleen.  Also consider "bananna bag". 3. Iron  deficiency and anemia of chronic disease - Receiving PRBC transfusion.  Rule out hemolysis if not already done.  Obtain coombs and haptoglobin.     Arlan Organ I., MD 08/11/2011

## 2011-08-11 NOTE — Progress Notes (Signed)
Subjective: Clinically improving.   Objective: Current vital signs: BP 139/98  Pulse 94  Temp 99.2 F (37.3 C) (Oral)  Resp 18  Ht 5\' 11"  (1.803 m)  Wt 83.6 kg (184 lb 4.9 oz)  BMI 25.71 kg/m2  SpO2 100% Vital signs in last 24 hours: Temp:  [97.5 F (36.4 C)-99.2 F (37.3 C)] 99.2 F (37.3 C) (07/13 1624) Pulse Rate:  [60-94] 94  (07/13 1700) Resp:  [10-27] 18  (07/13 1900) BP: (83-139)/(55-101) 139/98 mmHg (07/13 1900) SpO2:  [90 %-100 %] 100 % (07/13 1700) FiO2 (%):  [30 %-30.8 %] 30.4 % (07/13 1900) Weight:  [83.6 kg (184 lb 4.9 oz)] 83.6 kg (184 lb 4.9 oz) (07/13 0500)  Intake/Output from previous day: 07/12 0701 - 07/13 0700 In: 3093.1 [I.V.:1047.6; NG/GT:1295; IV Piggyback:750.5] Out: 2341 [Stool:200] Intake/Output this shift:   Nutritional status: NPO  Neurologic Exam: (Off sedation) Ment: Follows simple commands. Counts 5 fingers correctly (mouths "5" over ET tube). Mildly agitated.  CN: No gross facial asymmetry. Cough intact. Control and instrumentation engineer.  Motor/Sensory: Moves right upper extremity to noxious stimuli, weak movement of LUE to noxious. Weakly withdraws bilateral lower extremities to noxious stimuli, less so on left.  Reflexes: Hypoactive.   Lab Results: Results for orders placed during the hospital encounter of 08/07/11 (from the past 48 hour(s))  BASIC METABOLIC PANEL     Status: Abnormal   Collection Time   08/09/11  9:10 PM      Component Value Range Comment   Sodium 137  135 - 145 mEq/L    Potassium 3.2 (*) 3.5 - 5.1 mEq/L    Chloride 106  96 - 112 mEq/L    CO2 24  19 - 32 mEq/L    Glucose, Bld 157 (*) 70 - 99 mg/dL    BUN 15  6 - 23 mg/dL    Creatinine, Ser 1.61 (*) 0.50 - 1.35 mg/dL    Calcium 6.5 (*) 8.4 - 10.5 mg/dL    GFR calc non Af Amer 51 (*) >90 mL/min    GFR calc Af Amer 59 (*) >90 mL/min   MAGNESIUM     Status: Normal   Collection Time   08/10/11  3:10 AM      Component Value Range Comment   Magnesium 1.9  1.5 - 2.5 mg/dL   APTT      Status: Abnormal   Collection Time   08/10/11  3:10 AM      Component Value Range Comment   aPTT 75 (*) 24 - 37 seconds   CBC     Status: Abnormal   Collection Time   08/10/11  3:10 AM      Component Value Range Comment   WBC 20.1 (*) 4.0 - 10.5 K/uL    RBC 2.59 (*) 4.22 - 5.81 MIL/uL    Hemoglobin 8.6 (*) 13.0 - 17.0 g/dL    HCT 09.6 (*) 04.5 - 52.0 %    MCV 94.2  78.0 - 100.0 fL    MCH 33.2  26.0 - 34.0 pg    MCHC 35.2  30.0 - 36.0 g/dL    RDW 40.9 (*) 81.1 - 15.5 %    Platelets 16 (*) 150 - 400 K/uL   PHOSPHORUS     Status: Abnormal   Collection Time   08/10/11  3:10 AM      Component Value Range Comment   Phosphorus 1.1 (*) 2.3 - 4.6 mg/dL   HEPATIC FUNCTION PANEL     Status:  Abnormal   Collection Time   08/10/11  3:10 AM      Component Value Range Comment   Total Protein 4.9 (*) 6.0 - 8.3 g/dL    Albumin 1.1 (*) 3.5 - 5.2 g/dL    AST 30  0 - 37 U/L    ALT 27  0 - 53 U/L    Alkaline Phosphatase 93  39 - 117 U/L    Total Bilirubin 0.4  0.3 - 1.2 mg/dL    Bilirubin, Direct 0.2  0.0 - 0.3 mg/dL    Indirect Bilirubin 0.2 (*) 0.3 - 0.9 mg/dL   PROCALCITONIN     Status: Normal   Collection Time   08/10/11  3:10 AM      Component Value Range Comment   Procalcitonin 0.93     BASIC METABOLIC PANEL     Status: Abnormal   Collection Time   08/10/11  3:10 AM      Component Value Range Comment   Sodium 138  135 - 145 mEq/L    Potassium 3.7  3.5 - 5.1 mEq/L    Chloride 107  96 - 112 mEq/L    CO2 21  19 - 32 mEq/L    Glucose, Bld 189 (*) 70 - 99 mg/dL    BUN 13  6 - 23 mg/dL    Creatinine, Ser 1.61 (*) 0.50 - 1.35 mg/dL    Calcium 6.4 (*) 8.4 - 10.5 mg/dL    GFR calc non Af Amer 53 (*) >90 mL/min    GFR calc Af Amer 61 (*) >90 mL/min   SAVE SMEAR     Status: Normal   Collection Time   08/10/11  3:10 AM      Component Value Range Comment   Smear Review SMEAR STAINED AND AVAILABLE FOR REVIEW     LACTIC ACID, PLASMA     Status: Abnormal   Collection Time   08/10/11  4:00 AM       Component Value Range Comment   Lactic Acid, Venous 2.3 (*) 0.5 - 2.2 mmol/L   POCT I-STAT 3, BLOOD GAS (G3+)     Status: Abnormal   Collection Time   08/10/11  4:17 AM      Component Value Range Comment   pH, Arterial 7.466 (*) 7.350 - 7.450    pCO2 arterial 30.5 (*) 35.0 - 45.0 mmHg    pO2, Arterial 105.0 (*) 80.0 - 100.0 mmHg    Bicarbonate 22.3  20.0 - 24.0 mEq/L    TCO2 23  0 - 100 mmol/L    O2 Saturation 99.0      Acid-base deficit 1.0  0.0 - 2.0 mmol/L    Patient temperature 95.6 F      Collection site ARTERIAL LINE      Drawn by Nurse      Sample type ARTERIAL     LACTATE DEHYDROGENASE     Status: Abnormal   Collection Time   08/10/11  7:30 AM      Component Value Range Comment   LDH 359 (*) 94 - 250 U/L HEMOLYSIS AT THIS LEVEL MAY AFFECT RESULT  THERAPEUTIC PLASMA EXCHANGE     Status: Normal   Collection Time   08/10/11  1:30 PM      Component Value Range Comment   Plasma Exchange 4000 ML REQUESTED, 4095 THAWED      Unit Number [id number]      Blood Component Type FPT, CRYO DEP      Unit  division 00      Status of Unit ISSUED,FINAL      Transfusion Status OK TO TRANSFUSE      Unit Number [id number]      Blood Component Type FPT, CRYO DEP      Unit division 00      Status of Unit ISSUED,FINAL      Transfusion Status OK TO TRANSFUSE      Unit Number [id number]      Blood Component Type THAWED PLASMA      Unit division 00      Status of Unit ISSUED,FINAL      Transfusion Status OK TO TRANSFUSE      Unit Number [id number]      Blood Component Type THAWED PLASMA      Unit division 00      Status of Unit ISSUED,FINAL      Transfusion Status OK TO TRANSFUSE      Unit Number [id number]      Blood Component Type THAWED PLASMA      Unit division 00      Status of Unit ISSUED,FINAL      Transfusion Status OK TO TRANSFUSE      Unit Number [id number]      Blood Component Type THAWED PLASMA      Unit division 00      Status of Unit ISSUED,FINAL      Transfusion Status OK TO  TRANSFUSE      Unit Number [id number]      Blood Component Type THAWED PLASMA      Unit division 00      Status of Unit ISSUED,FINAL      Transfusion Status OK TO TRANSFUSE      Unit Number [id number]      Blood Component Type THAWED PLASMA      Unit division 00      Status of Unit ISSUED,FINAL      Transfusion Status OK TO TRANSFUSE      Unit Number [id number]      Blood Component Type THAWED PLASMA      Unit division 00      Status of Unit ISSUED,FINAL      Transfusion Status OK TO TRANSFUSE      Unit Number [id number]      Blood Component Type THAWED PLASMA      Unit division 00      Status of Unit ISSUED,FINAL      Transfusion Status OK TO TRANSFUSE      Unit Number [id number]      Blood Component Type THAWED PLASMA      Unit division 00      Status of Unit ISSUED,FINAL      Transfusion Status OK TO TRANSFUSE      Unit Number [id number]      Blood Component Type THAWED PLASMA      Unit division 00      Status of Unit ISSUED,FINAL      Transfusion Status OK TO TRANSFUSE      Unit Number [id number]      Blood Component Type THAWED PLASMA      Unit division 00      Status of Unit ISSUED,FINAL      Transfusion Status OK TO TRANSFUSE     MAGNESIUM     Status: Normal   Collection Time   08/11/11  4:34 AM      Component Value Range Comment   Magnesium 2.0  1.5 -  2.5 mg/dL   APTT     Status: Normal   Collection Time   08/11/11  4:34 AM      Component Value Range Comment   aPTT 32  24 - 37 seconds   PHOSPHORUS     Status: Abnormal   Collection Time   08/11/11  4:34 AM      Component Value Range Comment   Phosphorus 1.9 (*) 2.3 - 4.6 mg/dL   COMPREHENSIVE METABOLIC PANEL     Status: Abnormal   Collection Time   08/11/11  4:34 AM      Component Value Range Comment   Sodium 142  135 - 145 mEq/L    Potassium 3.2 (*) 3.5 - 5.1 mEq/L    Chloride 108  96 - 112 mEq/L    CO2 28  19 - 32 mEq/L    Glucose, Bld 129 (*) 70 - 99 mg/dL    BUN 12  6 - 23 mg/dL    Creatinine, Ser 5.62 (*)  0.50 - 1.35 mg/dL    Calcium 6.9 (*) 8.4 - 10.5 mg/dL    Total Protein 5.0 (*) 6.0 - 8.3 g/dL    Albumin 2.2 (*) 3.5 - 5.2 g/dL    AST 28  0 - 37 U/L    ALT 21  0 - 53 U/L    Alkaline Phosphatase 61  39 - 117 U/L    Total Bilirubin 0.4  0.3 - 1.2 mg/dL    GFR calc non Af Amer 52 (*) >90 mL/min    GFR calc Af Amer 60 (*) >90 mL/min   PROTIME-INR     Status: Normal   Collection Time   08/11/11  4:34 AM      Component Value Range Comment   Prothrombin Time 14.1  11.6 - 15.2 seconds    INR 1.07  0.00 - 1.49   CBC WITH DIFFERENTIAL     Status: Abnormal   Collection Time   08/11/11  4:34 AM      Component Value Range Comment   WBC 10.8 (*) 4.0 - 10.5 K/uL    RBC 1.78 (*) 4.22 - 5.81 MIL/uL    Hemoglobin 5.9 (*) 13.0 - 17.0 g/dL    HCT 13.0 (*) 86.5 - 52.0 %    MCV 97.8  78.0 - 100.0 fL    MCH 33.1  26.0 - 34.0 pg    MCHC 33.9  30.0 - 36.0 g/dL    RDW 78.4 (*) 69.6 - 15.5 %    Platelets 7 (*) 150 - 400 K/uL    Neutrophils Relative 84 (*) 43 - 77 %    Lymphocytes Relative 9 (*) 12 - 46 %    Monocytes Relative 7  3 - 12 %    Eosinophils Relative 0  0 - 5 %    Basophils Relative 0  0 - 1 %    Neutro Abs 9.0 (*) 1.7 - 7.7 K/uL    Lymphs Abs 1.0  0.7 - 4.0 K/uL    Monocytes Absolute 0.8  0.1 - 1.0 K/uL    Eosinophils Absolute 0.0  0.0 - 0.7 K/uL    Basophils Absolute 0.0  0.0 - 0.1 K/uL    RBC Morphology POLYCHROMASIA PRESENT     VITAMIN B12     Status: Normal   Collection Time   08/11/11  4:34 AM      Component Value Range Comment   Vitamin B-12 495  211 - 911 pg/mL  PROTIME-INR     Status: Normal   Collection Time   08/11/11  5:13 AM      Component Value Range Comment   Prothrombin Time 14.2  11.6 - 15.2 seconds    INR 1.08  0.00 - 1.49   TYPE AND SCREEN     Status: Normal (Preliminary result)   Collection Time   08/11/11  6:30 AM      Component Value Range Comment   ABO/RH(D) A POS      Antibody Screen NEG      Sample Expiration 08/14/2011      Unit Number [id number]       Blood Component Type RED CELLS,LR      Unit division 00      Status of Unit ISSUED      Transfusion Status OK TO TRANSFUSE      Crossmatch Result Compatible      Unit Number [id number]      Blood Component Type RED CELLS,LR      Unit division 00      Status of Unit ISSUED      Transfusion Status OK TO TRANSFUSE      Crossmatch Result Compatible     PREPARE RBC (CROSSMATCH)     Status: Normal   Collection Time   08/11/11  6:30 AM      Component Value Range Comment   Order Confirmation ORDER PROCESSED BY BLOOD BANK     DIRECT ANTIGLOBULIN TEST     Status: Normal   Collection Time   08/11/11  6:30 AM      Component Value Range Comment   DAT, complement NEG      DAT, IgG NEG     HAPTOGLOBIN     Status: Normal   Collection Time   08/11/11  9:10 AM      Component Value Range Comment   Haptoglobin 98  45 - 215 mg/dL   THERAPEUTIC PLASMA EXCHANGE     Status: Normal (Preliminary result)   Collection Time   08/11/11 12:30 PM      Component Value Range Comment   Plasma Exchange 4200 MLS      Unit Number [id number]      Blood Component Type THAWED PLASMA      Unit division 00      Status of Unit ISSUED      Transfusion Status OK TO TRANSFUSE      Unit Number [id number]      Blood Component Type THAWED PLASMA      Unit division 00      Status of Unit ISSUED      Transfusion Status OK TO TRANSFUSE      Unit Number [id number]      Blood Component Type THAWED PLASMA      Unit division 00      Status of Unit ISSUED      Transfusion Status OK TO TRANSFUSE      Unit Number [id number]      Blood Component Type THAWED PLASMA      Unit division 00      Status of Unit ISSUED      Transfusion Status OK TO TRANSFUSE      Unit Number [id number]      Blood Component Type THAWED PLASMA      Unit division 00      Status of Unit ISSUED      Transfusion Status OK TO TRANSFUSE      Unit Number [id number]  Blood Component Type THAWED PLASMA      Unit division 00      Status of Unit ISSUED       Transfusion Status OK TO TRANSFUSE      Unit Number [id number]      Blood Component Type THAWED PLASMA      Unit division 00      Status of Unit ISSUED      Transfusion Status OK TO TRANSFUSE      Unit Number [id number]      Blood Component Type THAWED PLASMA      Unit division 00      Status of Unit ISSUED      Transfusion Status OK TO TRANSFUSE      Unit Number [id number]      Blood Component Type THAWED PLASMA      Unit division 00      Status of Unit ISSUED      Transfusion Status OK TO TRANSFUSE      Unit Number [id number]      Blood Component Type THAWED PLASMA      Unit division 00      Status of Unit ISSUED      Transfusion Status OK TO TRANSFUSE      Unit Number [id number]      Blood Component Type THAWED PLASMA      Unit division 00      Status of Unit ISSUED      Transfusion Status OK TO TRANSFUSE      Unit Number [id number]      Blood Component Type THAWED PLASMA      Unit division 00      Status of Unit ISSUED      Transfusion Status OK TO TRANSFUSE      Unit Number [id number]      Blood Component Type THAWED PLASMA      Unit division 00      Status of Unit ISSUED      Transfusion Status OK TO TRANSFUSE     RENAL FUNCTION PANEL     Status: Abnormal   Collection Time   08/11/11  5:04 PM      Component Value Range Comment   Sodium 142  135 - 145 mEq/L    Potassium 3.1 (*) 3.5 - 5.1 mEq/L    Chloride 104  96 - 112 mEq/L    CO2 28  19 - 32 mEq/L    Glucose, Bld 121 (*) 70 - 99 mg/dL    BUN 14  6 - 23 mg/dL    Creatinine, Ser 7.42 (*) 0.50 - 1.35 mg/dL    Calcium 7.4 (*) 8.4 - 10.5 mg/dL    Phosphorus 2.5  2.3 - 4.6 mg/dL    Albumin 2.8 (*) 3.5 - 5.2 g/dL    GFR calc non Af Amer 47 (*) >90 mL/min    GFR calc Af Amer 54 (*) >90 mL/min   CBC     Status: Abnormal   Collection Time   08/11/11  5:40 PM      Component Value Range Comment   WBC 11.6 (*) 4.0 - 10.5 K/uL    RBC 2.37 (*) 4.22 - 5.81 MIL/uL    Hemoglobin 7.5 (*) 13.0 - 17.0 g/dL POST TRANSFUSION SPECIMEN    HCT 21.7 (*) 39.0 - 52.0 %    MCV 91.6  78.0 - 100.0 fL    MCH 31.6  26.0 - 34.0 pg    MCHC 34.6  30.0 - 36.0 g/dL    RDW 14.7 (*) 82.9 - 15.5 %    Platelets 9 (*) 150 - 400 K/uL CRITICAL VALUE NOTED.  VALUE IS CONSISTENT WITH PREVIOUSLY REPORTED AND CALLED VALUE.    Recent Results (from the past 240 hour(s))  MRSA PCR SCREENING     Status: Normal   Collection Time   08/07/11  4:28 AM      Component Value Range Status Comment   MRSA by PCR NEGATIVE  NEGATIVE Final   CULTURE, BLOOD (ROUTINE X 2)     Status: Normal (Preliminary result)   Collection Time   08/07/11 10:55 AM      Component Value Range Status Comment   Specimen Description BLOOD RIGHT ARM   Final    Special Requests BOTTLES DRAWN AEROBIC AND ANAEROBIC 10CC   Final    Culture  Setup Time 08/07/2011 16:42   Final    Culture     Final    Value:        BLOOD CULTURE RECEIVED NO GROWTH TO DATE CULTURE WILL BE HELD FOR 5 DAYS BEFORE ISSUING A FINAL NEGATIVE REPORT   Report Status PENDING   Incomplete   CULTURE, BLOOD (ROUTINE X 2)     Status: Normal (Preliminary result)   Collection Time   08/07/11 11:00 AM      Component Value Range Status Comment   Specimen Description BLOOD RIGHT HAND   Final    Special Requests BOTTLES DRAWN AEROBIC AND ANAEROBIC 10CC   Final    Culture  Setup Time 08/07/2011 16:42   Final    Culture     Final    Value:        BLOOD CULTURE RECEIVED NO GROWTH TO DATE CULTURE WILL BE HELD FOR 5 DAYS BEFORE ISSUING A FINAL NEGATIVE REPORT   Report Status PENDING   Incomplete   CLOSTRIDIUM DIFFICILE BY PCR     Status: Normal   Collection Time   08/09/11  2:17 PM      Component Value Range Status Comment   C difficile by pcr NEGATIVE  NEGATIVE Final     Lipid Panel No results found for this basename: CHOL,TRIG,HDL,CHOLHDL,VLDL,LDLCALC in the last 72 hours  Studies/Results: Dg Chest Port 1 View  08/11/2011  *RADIOLOGY REPORT*  Clinical Data: Endotracheal tube placement.  PORTABLE CHEST - 1 VIEW   Comparison: 08/10/2011  Findings: Endotracheal tube is 5.3 cm above the carina.  There are low lung volumes and bibasilar atelectasis.  Heart size is stable. Feeding tube extends into the abdomen.  Jugular central venous catheter in the SVC region. Left subclavian central line is still present but the pulmonary arterial catheter has been removed.  IMPRESSION: Low lung volumes with bibasilar atelectasis.  Support apparatuses as described.  Original Report Authenticated By: Richarda Overlie, M.D.   Dg Chest Port 1 View  08/10/2011  *RADIOLOGY REPORT*  Clinical Data: 56 year old male with seizure disorder, encephalopathy, acute respiratory failure.  Intubated.  PORTABLE CHEST - 1 VIEW  Comparison: 08/09/2011 and earlier.  Findings: Portable semi upright AP view at 0537 hours. Endotracheal tube tip in stable position between the level of clavicles and carina.  Left subclavian approach Swan-Ganz catheter remains in place, tip is at the level of the right lower lobe pulmonary artery.  Stable right IJ central line.  Enteric feeding tube courses to the abdomen, tip not definitely identified.  Continued low lung volumes.  No pulmonary edema.  No definite pleural effusion.  Patchy  bibasilar opacity is stable and most resembles atelectasis.  IMPRESSION: 1.  Swan-Ganz catheter tip at the right lower lobe pulmonary artery level. 2.  Otherwise, stable lines and tubes. 3.  Stable mild atelectasis.  Original Report Authenticated By: Harley Hallmark, M.D.    Medications:  Scheduled:   . therapeutic plasma exchange solution   Dialysis Once in dialysis  . anticoagulant sodium citrate  5 mL Intracatheter Once  . antiseptic oral rinse  15 mL Mouth Rinse QID  . calcium gluconate  2 g Intravenous Once  . chlorhexidine  15 mL Mouth/Throat BID  . citrate dextrose      . citrate dextrose      . escitalopram  20 mg Oral Daily  . feeding supplement (OSMOLITE 1.2 CAL)  1,000 mL Per Tube Q24H  . feeding supplement  60 mL Per Tube TID  WC  . folic acid  1 mg Oral Daily  . levetiracetam  500 mg Intravenous Q12H  . methylPREDNISolone (SOLU-MEDROL) injection  125 mg Intravenous Q12H  . pantoprazole (PROTONIX) IV  40 mg Intravenous Q24H  . phenytoin (DILANTIN) IV  200 mg Intravenous Q12H  . piperacillin-tazobactam  3.375 g Intravenous Q6H  . potassium chloride  10 mEq Intravenous Q1 Hr x 4  . potassium chloride      . thiamine  100 mg Oral Daily  . vancomycin  1,000 mg Intravenous Q24H  . DISCONTD: anticoagulant sodium citrate  5 mL Intracatheter Once  . DISCONTD: calcium gluconate  2 g Intravenous Once  . DISCONTD: potassium chloride  10 mEq Intravenous Q1 Hr x 4    Assessment/Plan: 1. AMS. Etiology for the patient's AMS was most likely seizure with postictal state, in conjunction with the subdural hematoma with midline shift and possible EtOH withdrawal. TTP is also a likely contributing factor. The patient is clinically improving. Continue Dilantin, Keppra and thiamine rx. Continue close monitoring. Track Dilantin levels (free and total) with albumin level. Repeat ionized and total calcium levels. 2. TTP. On plasmapheresis. TTP a known cause of AMS and seizures.     LOS: 4 days   @Electronically  signed: Dr. Caryl Pina 08/11/2011  7:56 PM

## 2011-08-11 NOTE — Progress Notes (Signed)
CRITICAL VALUE ALERT  Critical value received:  hgb 5.9  Date of notification:  08/11/2011   Time of notification:  0600  Critical value read back:yes  Nurse who received alert:  Carlyon Prows  MD notified (1st page):  Pola Corn MD Dr. Ulla Potash  Time of first page:  0600  MD notified (2nd page):  Time of second page:  Responding MD:  Pola Corn MD Dr. Ulla Potash   Time MD responded:  0600

## 2011-08-11 NOTE — Progress Notes (Signed)
Nursing: Pt hypertensive with BP 170s/100s via arterial line. CVP measured, result= 29. Pt also with edema in all 4 extremities. Dr. Marin Shutter notified, new orders received to change desired fluid removal from 0 to -100 cc/hr. Will continue to closely monitor. Alva Garnet, Dalena Plantz [place]

## 2011-08-12 ENCOUNTER — Inpatient Hospital Stay (HOSPITAL_COMMUNITY): Payer: Medicare Other

## 2011-08-12 DIAGNOSIS — K7010 Alcoholic hepatitis without ascites: Secondary | ICD-10-CM

## 2011-08-12 LAB — THERAPEUTIC PLASMA EXCHANGE (BLOOD BANK)
Plasma Exchange: 4200
Unit division: 0
Unit division: 0
Unit division: 0
Unit division: 0
Unit division: 0
Unit division: 0

## 2011-08-12 LAB — TYPE AND SCREEN: Unit division: 0

## 2011-08-12 LAB — COMPREHENSIVE METABOLIC PANEL
ALT: 22 U/L (ref 0–53)
AST: 33 U/L (ref 0–37)
Albumin: 2.5 g/dL — ABNORMAL LOW (ref 3.5–5.2)
Alkaline Phosphatase: 54 U/L (ref 39–117)
Calcium: 7.3 mg/dL — ABNORMAL LOW (ref 8.4–10.5)
GFR calc Af Amer: 67 mL/min — ABNORMAL LOW (ref >90)
Glucose, Bld: 134 mg/dL — ABNORMAL HIGH (ref 70–99)
Potassium: 3.6 mEq/L (ref 3.5–5.1)
Sodium: 142 mEq/L (ref 135–145)
Total Protein: 5.2 g/dL — ABNORMAL LOW (ref 6.0–8.3)

## 2011-08-12 LAB — CALCIUM, IONIZED: Calcium, Ion: 0.95 mmol/L — ABNORMAL LOW (ref 1.12–1.32)

## 2011-08-12 LAB — CBC WITH DIFFERENTIAL/PLATELET
Basophils Relative: 0 % (ref 0–1)
Eosinophils Absolute: 0 10*3/uL (ref 0.0–0.7)
Hemoglobin: 7.6 g/dL — ABNORMAL LOW (ref 13.0–17.0)
Lymphocytes Relative: 6 % — ABNORMAL LOW (ref 12–46)
MCHC: 34.2 g/dL (ref 30.0–36.0)
Neutrophils Relative %: 87 % — ABNORMAL HIGH (ref 43–77)
Platelets: 10 10*3/uL — CL (ref 150–400)
RBC: 2.41 MIL/uL — ABNORMAL LOW (ref 4.22–5.81)

## 2011-08-12 LAB — VANCOMYCIN, TROUGH: Vancomycin Tr: 17.2 ug/mL (ref 10.0–20.0)

## 2011-08-12 LAB — MAGNESIUM: Magnesium: 2 mg/dL (ref 1.5–2.5)

## 2011-08-12 LAB — APTT: aPTT: 29 seconds (ref 24–37)

## 2011-08-12 MED ORDER — DIPHENHYDRAMINE HCL 25 MG PO CAPS: 25.0000 mg | ORAL_CAPSULE | Freq: Four times a day (QID) | ORAL | Status: DC | PRN

## 2011-08-12 MED ORDER — PHENYTOIN SODIUM 50 MG/ML IJ SOLN
100.0000 mg | Freq: Three times a day (TID) | INTRAMUSCULAR | Status: DC
  Administered 2011-08-12 – 2011-08-15 (×8): 100 mg via INTRAVENOUS
  Filled 2011-08-12 (×11): qty 2

## 2011-08-12 MED ORDER — SODIUM CHLORIDE 0.9 % IV SOLN: 4.0000 g | INTRAVENOUS | Status: DC | PRN

## 2011-08-12 MED ORDER — CALCIUM GLUCONATE 10 % IV SOLN: 2.0000 g | INTRAVENOUS | Status: DC | PRN

## 2011-08-12 MED ORDER — SODIUM CHLORIDE 0.9 % IV SOLN: 2.0000 g | INTRAVENOUS | Status: DC | PRN

## 2011-08-12 MED ORDER — PANTOPRAZOLE SODIUM 40 MG PO PACK
40.0000 mg | PACK | Freq: Every day | ORAL | Status: DC
  Administered 2011-08-12 – 2011-08-13 (×2): 40 mg
  Filled 2011-08-12 (×2): qty 20

## 2011-08-12 MED ORDER — ACD FORMULA A 0.73-2.45-2.2 GM/100ML VI SOLN: 500.0000 mL | Status: DC

## 2011-08-12 MED ORDER — ANTICOAGULANT SODIUM CITRATE 4% (200MG/5ML) IV SOLN: 5.0000 mL | Freq: Once | Status: DC

## 2011-08-12 MED ORDER — CHLORHEXIDINE GLUCONATE 0.12 % MT SOLN
15.0000 mL | Freq: Two times a day (BID) | OROMUCOSAL | Status: DC
  Administered 2011-08-12 – 2011-08-13 (×2): 15 mL via OROMUCOSAL
  Filled 2011-08-12: qty 15

## 2011-08-12 MED ORDER — ACD FORMULA A 0.73-2.45-2.2 GM/100ML VI SOLN
Status: AC
  Administered 2011-08-12: 500 mL via INTRAVENOUS
  Filled 2011-08-12: qty 1000

## 2011-08-12 MED ORDER — CALCIUM GLUCONATE 10 % IV SOLN: 2.0000 g | Freq: Once | INTRAVENOUS | Status: DC

## 2011-08-12 MED ORDER — CALCIUM CARBONATE ANTACID 500 MG PO CHEW: 2.0000 | CHEWABLE_TABLET | ORAL | Status: DC | PRN

## 2011-08-12 MED ORDER — ACETAMINOPHEN 325 MG PO TABS: 650.0000 mg | ORAL_TABLET | ORAL | Status: DC | PRN

## 2011-08-12 NOTE — Consult Note (Signed)
[facility] INPATIENT PROGRESS NOTE  Name: [name]      MRN: [id number]    Location: 2111/2111-01  Date: 08/12/2011 Time:10:33 AM   Subjective: Interval History:[name] was still intubated.  He was more awake today compared to yesterday according to nursing staff.  He denied SOB, CP, abdominal pain.  Nursing staff noticed some oozing from insertion site of right IJ.  However, there was no notice of bleeding elsewhere.   Objective: Vital signs in last 24 hours: Temp:  [97.5 F (36.4 C)-99.2 F (37.3 C)] 98.4 F (36.9 C) (07/14 0738) Pulse Rate:  [60-98] 88  (07/14 1000) Resp:  [10-27] 15  (07/14 1000) BP: (101-165)/(71-108) 132/92 mmHg (07/14 1000) SpO2:  [90 %-100 %] 100 % (07/14 1000) FiO2 (%):  [30 %-40 %] 30.4 % (07/14 1000) Weight:  [187 lb 9.8 oz (85.1 kg)] 187 lb 9.8 oz (85.1 kg) (07/14 0500)    Intake/Output from previous day: 07/13 0701 - 07/14 0700 In: 2994 [I.V.:905] Out: 4512     PHYSICAL EXAM:  Gen: intubated, slightly sedated.  Eyes: No scleral icterus or jaundice. Respiratory: Lungs showed slight bibasilar crackles. Cardiovascular: normal heart rate and rhythm; S1/S2; without murmur, rubs, or gallop. There was no pedal edema. GI: Abdomen was soft, flat, nontender, nondistended, without organomegaly. Skin exam was without ecchymosis, petechiae. There was no cyanosis or abnormality with examination of his toes.      Studies/Results:  Reviewed.   Abdominal US:  08/11/11:   Cirrhosis with ascites. This is felt to be responsible for the  pericholecystic fluid. This limits detection of cholecystitis.  Gallbladder wall thickening may be related to the cirrhosis. No  gallstones.  Secondary to habitus and bowel gas, poor delineation of the  pancreas, left kidney and aorta.   MEDICATIONS: reviewed.   Assessment/Plan:  1.  EtOH abuse:  Prior to this admission. 2.  Seizure disorder: query if due to EtOH.   3.  History of menigioma 4.   Acute to subacute subdural hematoma:  Stable on CT.  5.  Acute renal failure:  Unclear etiology.  He is on hemodialysis per Nephrology. 6.  Hepatitis C. 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 8.  Anemia and thrombocytopenia:  Most likely due to uncompensated cirrhosis and/or bone marrow suppression from acute illness.  HIT panel sent on 08/09/11 is still pending.  There is low clinical suspicion for TTP (due to lack of hemolysis). Agree with transfusion prn for Hgb <7 or Plt <10.  I defer to Nephrology to continue plasma exchange as see they deem appropriate.

## 2011-08-12 NOTE — Progress Notes (Signed)
Subjective: Interval History: none.  Objective: Vital signs in last 24 hours: Temp:  [97.5 F (36.4 C)-99.2 F (37.3 C)] 98.4 F (36.9 C) (07/14 0738) Pulse Rate:  [60-98] 91  (07/14 0751) Resp:  [10-27] 23  (07/14 0751) BP: (91-165)/(68-108) 165/81 mmHg (07/14 0751) SpO2:  [90 %-100 %] 100 % (07/14 0751) FiO2 (%):  [30 %-40 %] 40 % (07/14 0751) Weight:  [85.1 kg (187 lb 9.8 oz)] 85.1 kg (187 lb 9.8 oz) (07/14 0500) Weight change: 1.5 kg (3 lb 4.9 oz)  Intake/Output from previous day: 07/13 0701 - 07/14 0700 In: 2994 [I.V.:905; Blood:730; NG/GT:500; IV Piggyback:859] Out: 4512 [Stool:900] Intake/Output this shift: Total I/O In: 110 [I.V.:20; NG/GT:40; IV Piggyback:50] Out: 188 [Other:188]  General appearance: uncooperative and opens eyes, does not follow commands Resp: rales bibasilar Chest wall: no tenderness, iJ cath Cardio: S1, S2 normal and systolic murmur: holosystolic 2/6, blowing at apex GI: mod distension, pos bs, liver down 4 cm Extremities: edema 3+  Lab Results:  Basename 08/12/11 0502 08/11/11 1740  WBC 9.2 11.6*  HGB 7.6* 7.5*  HCT 22.2* 21.7*  PLT 10* 9*   BMET:  Basename 08/12/11 0502 08/11/11 2021 08/11/11 1704  NA 142 -- 142  K 3.6 -- 3.1*  CL 105 -- 104  CO2 27 -- 28  GLUCOSE 134* -- 121*  BUN 11 -- 14  CREATININE 1.34 -- 1.60*  CALCIUM 7.3* 7.3* --   No results found for this basename: PTH:2 in the last 72 hours Iron Studies: No results found for this basename: IRON,TIBC,TRANSFERRIN,FERRITIN in the last 72 hours  Studies/Results: US Abdomen Port  08/12/2011  *RADIOLOGY REPORT*  Clinical Data:  Thrombocytopenia.  Hepatitis C.  Alcohol abuse. Hypertension.  COMPLETE ABDOMINAL ULTRASOUND  Comparison:  08/07/2011 renal sonogram.  Findings:  Gallbladder:  No gallstones.  Gallbladder wall thickening measuring up to 5.2 mm.  Ascites with pericholecystic fluid.  The patient well not tender over this region during scanning.  Common bile duct:  3.4  mm.  Liver:  Diffusely echogenic with lobular contour consistent with cirrhosis.  No obvious mass.  Evaluation limited.  IVC:  Appears normal.  Pancreas:  Not visualized secondary to bowel gas.  Spleen:  8.1 cm.  Poorly delineated.  Right Kidney:  10.5 cm.  No hydronephrosis.  Increased renal echogenicity.  Left Kidney:  Not well delineated secondary to habitus and bowel gas.  Abdominal aorta:  Poorly delineated secondary to habitus and bowel gas.  IMPRESSION: Cirrhosis with ascites.  This is felt to be responsible for the pericholecystic fluid.  This limits detection of cholecystitis. Gallbladder wall thickening may be related to the cirrhosis.  No gallstones.  Secondary to habitus and bowel gas, poor delineation of the pancreas, left kidney and aorta.  Original Report Authenticated By: Fuller Canada, M.D.   Dg Chest Port 1 View  08/12/2011  *RADIOLOGY REPORT*  Clinical Data: Postop from craniotomy for subdural hematoma.  On ventilator.  Chronic renal failure  PORTABLE CHEST - 1 VIEW  Comparison: 08/11/2011  Findings: Support lines tubes remain in appropriate position.  Low lung volumes are again seen with bibasilar atelectasis which appears stable.  No new areas of pulmonary past year seen. Heart size is stable.  IMPRESSION: No significant change in low lung volumes and bibasilar atelectasis.  Original Report Authenticated By: Danae Orleans, M.D.   Dg Chest Port 1 View  08/11/2011  *RADIOLOGY REPORT*  Clinical Data: Endotracheal tube placement.  PORTABLE CHEST - 1 VIEW  Comparison: 08/10/2011  Findings: Endotracheal tube is 5.3 cm above the carina.  There are low lung volumes and bibasilar atelectasis.  Heart size is stable. Feeding tube extends into the abdomen.  Jugular central venous catheter in the SVC region. Left subclavian central line is still present but the pulmonary arterial catheter has been removed.  IMPRESSION: Low lung volumes with bibasilar atelectasis.  Support apparatuses as described.   Original Report Authenticated By: Richarda Overlie, M.D.   Dg Abd Portable 1v  08/12/2011  *RADIOLOGY REPORT*  Clinical Data: Feeding tube placement.  PORTABLE ABDOMEN - 1 VIEW  Comparison: 08/07/2011  Findings: Feeding tube tip projects over the proximal stomach. Dilated small bowel loops measuring up to 3.8 cm. There is some gas within colon however decreased in the interval.  A central venous catheter projects over the right pelvis.  Lung bases are excluded from the image.  No acute osseous finding.  IMPRESSION: Increased small bowel distension up to 3.8 cm. Small bowel obstruction not excluded.  Feeding tube tip projects over proximal stomach. Advancement recommended.  Original Report Authenticated By: Waneta Martins, M.D.    I have reviewed the patient's current medications.  Assessment/Plan: 1 AKI/CKD oliguric vol xs, start net neg now bp ^.  Acid/base/k ok.  Good solute clearance. 2 Cirrohsis on U/S changes 3 Ptlt TTP vs other cont PE 4 SDH 5 Sz disorder 6 Anemia stable 7Resp failure per CCM 8 Gout  P CVVHD, net neg, epo/fe, AB , PE    LOS: 5 days   [name] L 08/12/2011,9:23 AM

## 2011-08-12 NOTE — Progress Notes (Signed)
Name: HERMON ZEA MRN: [id number] DOB: 04-Apr-1955    LOS: 5  Referring Provider:  Transferred from [facility] Reason for Referral:  Altered mental status  PULMONARY / CRITICAL CARE MEDICINE   Brief patient description:  7/9  Brought to [facility] with altered mental status.  Head CT demonstrated small right subdural hematoma with 4 mm midline shift.  Labs demonstarted multiple metabolic derangements.Transferred to [facility] for farther management.  Interval/subjective 7/10-Had a seizure ~530 am this am, treated w/ 1 mg ativan. [id number] PCCM team called to bedside for eval of Persistent hypotension not responding to volume challenge and decrease in MS. On bedside eval he is unresponsive. Will bite down when attempting to illicit gag, but has no other response to noxious stimuli. Currently SBP in 60s. Intubated. Central line placed. Transferred to ICU due to continued hypotension of unknown origin. 7/11 continuous EEG without seizure activity, asymetrical activity likely due to subdural, moderate encephalopathy-perhaps due to sedative medications. Only on intermittent sedation-infrequent use 7/12 Plasmapheresis started due to concern for TTP; pressors weaned off  Current status:  following commands intermittently Haptoglobin 78   Vital Signs:  Reviewed  Temp:  [97.5 F (36.4 C)-99.2 F (37.3 C)] 98.4 F (36.9 C) (07/14 0738) Pulse Rate:  [60-98] 88  (07/14 1400) Resp:  [6-27] 14  (07/14 1400) BP: (109-169)/(71-108) 141/95 mmHg (07/14 1400) SpO2:  [90 %-100 %] 100 % (07/14 1400) FiO2 (%):  [30 %-40 %] 30.5 % (07/14 1400) Weight:  [85.1 kg (187 lb 9.8 oz)] 85.1 kg (187 lb 9.8 oz) (07/14 0500)  Intake/Output Summary (Last 24 hours) at 08/12/11 1411 Last data filed at 08/12/11 1400  Gross per 24 hour  Intake   2228 ml  Output   5098 ml  Net  -2870 ml     Physical Examination: General:  Intubated Neuro:  Opens eyes to stimulation, follows some commands HEENT:  PERRL Neck:  Supple, no  JVD Cardiovascular:  RRR, no murmurs Lungs:  CTAB Abdomen:  Soft, nontender, bowel sounds present Musculoskeletal:  no edema Skin:  Intact  Active Problems:  NEOP, BNG, CEREBRAL MENINGES  HYPERTENSION, BENIGN ESSENTIAL  HYPERTENSION, KIDNEY DISEASE, UNCONTROLLED  SEIZURE DISORDER  Encephalopathy acute  Hepatitis C without mention of hepatic coma  Diastolic heart failure  Subdural hematoma  Hypokalemia  Thrombocytopenia  Acidosis  Anemia  Malnutrition  Acute respiratory failure  Shock  Altered mental status  AKI (acute kidney injury)  TTP (thrombotic thrombocytopenic purpura)  ASSESSMENT AND PLAN  PULMONARY  Lab 08/10/11 0417 08/09/11 0454 08/08/11 0400 08/07/11 1013 08/07/11 0455  PHART 7.466* 7.536* 7.379 7.336* 7.382  PCO2ART 30.5* 23.3* 18.6* 16.0* 13.2*  PO2ART 105.0* 125.0* 166.0* 514.0* 160.0*  HCO3 22.3 20.3 10.7* 8.3* 7.6*  O2SAT 99.0 99.0 99.8 100.0 99.6   Ventilator Settings: Vent Mode:  [-] PSV FiO2 (%):  [30 %-40 %] 30.5 % Set Rate:  [14 bmp] 14 bmp Vt Set:  [600 mL] 600 mL PEEP:  [5 cmH20] 5 cmH20 Pressure Support:  [5 cmH20-10 cmH20] 10 cmH20 Plateau Pressure:  [16 cmH20-18 cmH20] 16 cmH20 CXR: 7/11 CXR-mildly improved vascular congestion 7/12-some bilateral opacities possible atelectasis. Vascular congestion minimal.  7/13 >> low volumes and basilar atx 7/14 >> stable compared w 7/13 ETT: 7/9>>>  A:  Acute Respiratory Failure in setting of Inability to protect airway s/p seizure and complicated by multiple metabolic derangements.  P:   Maintain on vent support while neurologic status- Minimal vent settings but concerned about patient's ability to protect airway  SBT as tolerated CXR reviewed-minimal vascular congestion, some basilar atx sputum culture, pending.  CARDIOVASCULAR  Lab 08/10/11 0400 08/08/11 2115 08/08/11 1545 08/07/11 1155 08/07/11 0530  TROPONINI -- -- -- -- 0.39*  LATICACIDVEN 2.3* 2.3* -- 0.8 5.3*  PROBNP -- -- 3123.0* --  --   ECG:  NA Lines:right IJ CVL 7/9>>> Left rad aline 7/9>>> PA-cath 7/10 >> 7/12  A: History of diastolic CHF, no evidence of exacerbation.  History of hypetension. Has mild elevated Trop I. Hard to interpret in the setting of renal failure.  P:  Cardiac enzymes positive. BNP elevated but difficult to interpret in light of multiple comorbitides.  ECHO hyperdynamic with EF of 75-80%  A:Circulatory Shock. Unclear etiology. Suspect infection vs acidosis due to renal failure as cause for his shock.  Heart is hyperdynamic by TTE. BP has improved as his acidosis has improved  P: Target CVP 12 Levophed weaned to off CVVHD per renal Cortisol 17.4, on hydrocortisone IVF now [facility]  RENAL  Lab 08/12/11 0502 08/11/11 2021 08/11/11 1704 08/11/11 0434 08/10/11 0310 08/09/11 2110 08/09/11 0310 08/08/11 0350  NA 142 -- 142 142 138 137 -- --  K 3.6 -- 3.1* -- -- -- -- --  CL 105 -- 104 108 107 106 -- --  CO2 27 -- 28 28 21 24  -- --  BUN 11 -- 14 12 13 15  -- --  CREATININE 1.34 -- 1.60* 1.47* 1.45* 1.49* -- --  CALCIUM 7.3* 7.3* 7.4* 6.9* 6.4* -- -- --  MG 2.0 -- -- 2.0 1.9 -- 1.7 1.4*  PHOS 1.7* -- 2.5 1.9* 1.1* -- 1.6* --   Intake/Output      07/13 0701 - 07/14 0700 07/14 0701 - 07/15 0700   I.V. (mL/kg) 905 (10.6) 170 (2)   Blood 730    NG/GT 500 400   IV Piggyback 859 154   Total Intake(mL/kg) 2994 (35.2) 724 (8.5)   Other 3612 1290   Stool 900    Total Output 4512 1290   Net -1518 -566         Foley:  7/9  A:  Acute on chronic renal failure in setting of Dehydration and suspected TTP. CKD and renal artery stenosis.  Gap/non-gap metabolic acidosis.  P: Appreciate renal recommendations. CVVHD as ordered 7/9 Renal US -chronic medical renal disease.  Renal following potassium and repleting. Also repleting phosphorus.   GASTROINTESTINAL  Lab 08/12/11 0502 08/11/11 2021 08/11/11 1704 08/11/11 0434 08/10/11 0310 08/09/11 0310 08/08/11 0350  AST 33 -- -- 28 30 32 42*  ALT 22 --  -- 21 27 28 27   ALKPHOS 54 -- -- 61 93 86 76  BILITOT 0.9 -- -- 0.4 0.4 0.6 0.7  PROT 5.2* -- -- 5.0* 4.9* 4.6* 4.3*  ALBUMIN 2.5* 2.8* 2.8* 2.2* 1.1* -- --    A:  History of hepatitis C.  Suspected malnutrition. P:   Tube feeds PPI  HEMATOLOGIC  Lab 08/12/11 0502 08/11/11 1740 08/11/11 0513 08/11/11 0434 08/10/11 0310 08/09/11 1102 08/09/11 0856 08/09/11 0310 08/08/11 2115  HGB 7.6* 7.5* -- 5.9* 8.6* -- -- 8.7* --  HCT 22.2* 21.7* -- 17.4* 24.4* -- -- 23.8* --  PLT 10* 9* -- 7* 16* 23* -- -- --  INR -- -- 1.08 1.07 -- 1.17 1.30 -- 1.33  APTT 29 -- -- 32 75* 188* >200* -- --   A:  Progressive anemia, macrocitic (Hb 8.0, MCV 109).  Thrombocytopenia (Plt 60 >> 27>> 16).  Clinical syndrome now  seems most consistent with TTP, especially with schistocytes on smear and negative DIC panel. He has been rx with stress dose steroids. Haptoglobin low-normal (78), LDH 359 >> probable low grade hemolysis P:  Empirically treat TTP with plasmaphoresis given his intracranial bleeding risk with severe thrombocytopenia; steroids ordered HIT panel still pending.  PRBC for Hgb < 7, Plt for < 10k Follow LDH and haptoglobin  INFECTIOUS  Lab 08/12/11 0502 08/11/11 1740 08/11/11 0434 08/10/11 0310 08/09/11 0310 08/08/11 0350 08/07/11 1155 08/07/11 0530  WBC 9.2 11.6* 10.8* 20.1* 19.7* -- -- --  PROCALCITON -- -- -- 0.93 -- 0.66 0.66 0.99   Cultures: BCX2 7/9>>>NTD Sputum 7/9>>>NTD  Antibiotics: Vanc 7/10>>> Zosyn 7/10 >>   A:  No evidence of acute infection but patient recovering from shock P:   PCT 0.66--> minimal elevation empiric abx as ordered  ENDOCRINE  Lab 08/08/11 1957 08/07/11 0424  GLUCAP 152* 88   A:  No active issues P:   No intervention required  NEUROLOGIC  7/9  Head CT >>> Small right subdural hematoma with 4 mm midline shift.   A:  Acute encephalopathy.  History of alcohol abuse.  History of meningioma resection.  History of seizure disorder on Dilantin.  History  of medical noncompliance. Had witnessed seizure in ICU. Post-ictal 7/11.  Suspect his altered MS related to TTP P:   Neurosurgery following >> no role intervention at this time. Continuous EEG -no signs recurrent seizure. Plan for repeat CT head if his MS changes Dilantin, Keppra continued with Q3d dilantin level. Dilantin per pharmacy. Thiamine & Folate. Continue Lexapro. Will treat for TTP as above  CC time 30 minutes  Levy Pupa, MD, PhD 08/12/2011, 2:11 PM [facility] [phone] or if no answer [phone]

## 2011-08-12 NOTE — Progress Notes (Signed)
Subjective: Patient reports Remains encephalopathic intubated on ventilator.  Objective: Vital signs in last 24 hours: Temp:  [97.5 F (36.4 C)-99.2 F (37.3 C)] 98.4 F (36.9 C) (07/14 0738) Pulse Rate:  [60-98] 88  (07/14 1000) Resp:  [10-27] 15  (07/14 1000) BP: (101-165)/(71-108) 132/92 mmHg (07/14 1000) SpO2:  [90 %-100 %] 100 % (07/14 1000) FiO2 (%):  [30 %-40 %] 30.4 % (07/14 1000) Weight:  [85.1 kg (187 lb 9.8 oz)] 85.1 kg (187 lb 9.8 oz) (07/14 0500)  Intake/Output from previous day: 07/13 0701 - 07/14 0700 In: 2994 [I.V.:905; Blood:730; NG/GT:500; IV Piggyback:859] Out: 4512 [Stool:900] Intake/Output this shift: Total I/O In: 1374 [I.V.:90; NG/GT:220; IV Piggyback:1064] Out: 394 [Other:394]  Sedated at this time. Pupils small reactive  Lab Results:  Basename 08/12/11 0502 08/11/11 1740  WBC 9.2 11.6*  HGB 7.6* 7.5*  HCT 22.2* 21.7*  PLT 10* 9*   BMET  Basename 08/12/11 0502 08/11/11 2021 08/11/11 1704  NA 142 -- 142  K 3.6 -- 3.1*  CL 105 -- 104  CO2 27 -- 28  GLUCOSE 134* -- 121*  BUN 11 -- 14  CREATININE 1.34 -- 1.60*  CALCIUM 7.3* 7.3* --    Studies/Results: US Abdomen Port  08/12/2011  *RADIOLOGY REPORT*  Clinical Data:  Thrombocytopenia.  Hepatitis C.  Alcohol abuse. Hypertension.  COMPLETE ABDOMINAL ULTRASOUND  Comparison:  08/07/2011 renal sonogram.  Findings:  Gallbladder:  No gallstones.  Gallbladder wall thickening measuring up to 5.2 mm.  Ascites with pericholecystic fluid.  The patient well not tender over this region during scanning.  Common bile duct:  3.4 mm.  Liver:  Diffusely echogenic with lobular contour consistent with cirrhosis.  No obvious mass.  Evaluation limited.  IVC:  Appears normal.  Pancreas:  Not visualized secondary to bowel gas.  Spleen:  8.1 cm.  Poorly delineated.  Right Kidney:  10.5 cm.  No hydronephrosis.  Increased renal echogenicity.  Left Kidney:  Not well delineated secondary to habitus and bowel gas.  Abdominal  aorta:  Poorly delineated secondary to habitus and bowel gas.  IMPRESSION: Cirrhosis with ascites.  This is felt to be responsible for the pericholecystic fluid.  This limits detection of cholecystitis. Gallbladder wall thickening may be related to the cirrhosis.  No gallstones.  Secondary to habitus and bowel gas, poor delineation of the pancreas, left kidney and aorta.  Original Report Authenticated By: Fuller Canada, M.D.   Dg Chest Port 1 View  08/12/2011  *RADIOLOGY REPORT*  Clinical Data: Postop from craniotomy for subdural hematoma.  On ventilator.  Chronic renal failure  PORTABLE CHEST - 1 VIEW  Comparison: 08/11/2011  Findings: Support lines tubes remain in appropriate position.  Low lung volumes are again seen with bibasilar atelectasis which appears stable.  No new areas of pulmonary past year seen. Heart size is stable.  IMPRESSION: No significant change in low lung volumes and bibasilar atelectasis.  Original Report Authenticated By: Danae Orleans, M.D.   Dg Chest Port 1 View  08/11/2011  *RADIOLOGY REPORT*  Clinical Data: Endotracheal tube placement.  PORTABLE CHEST - 1 VIEW  Comparison: 08/10/2011  Findings: Endotracheal tube is 5.3 cm above the carina.  There are low lung volumes and bibasilar atelectasis.  Heart size is stable. Feeding tube extends into the abdomen.  Jugular central venous catheter in the SVC region. Left subclavian central line is still present but the pulmonary arterial catheter has been removed.  IMPRESSION: Low lung volumes with bibasilar atelectasis.  Support  apparatuses as described.  Original Report Authenticated By: Richarda Overlie, M.D.   Dg Abd Portable 1v  08/12/2011  *RADIOLOGY REPORT*  Clinical Data: Feeding tube placement.  PORTABLE ABDOMEN - 1 VIEW  Comparison: 08/07/2011  Findings: Feeding tube tip projects over the proximal stomach. Dilated small bowel loops measuring up to 3.8 cm. There is some gas within colon however decreased in the interval.  A central  venous catheter projects over the right pelvis.  Lung bases are excluded from the image.  No acute osseous finding.  IMPRESSION: Increased small bowel distension up to 3.8 cm. Small bowel obstruction not excluded.  Feeding tube tip projects over proximal stomach. Advancement recommended.  Original Report Authenticated By: Waneta Martins, M.D.    Assessment/Plan: Continue supportive care for multiple medical problems. Continue to observe clinical status  LOS: 5 days  Continue to observe clinical status   Dejour Vos J 08/12/2011, 10:54 AM

## 2011-08-12 NOTE — Progress Notes (Signed)
ANTIBIOTIC CONSULT NOTE - FOLLOW UP  Pharmacy Consult for Vancomycin, Zosyn Indication: Empiric coverage for PNA  No Known Allergies  Patient Measurements: Height: 5\' 11"  (180.3 cm) Weight: 187 lb 9.8 oz (85.1 kg) IBW/kg (Calculated) : 75.3   Vital Signs: Temp: 98.4 F (36.9 C) (07/14 0738) Temp src: Oral (07/14 0738) BP: 141/95 mmHg (07/14 1400) Pulse Rate: 88  (07/14 1400) Intake/Output from previous day: 07/13 0701 - 07/14 0700 In: 2994 [I.V.:905; Blood:730; NG/GT:500; IV Piggyback:859] Out: 4512 [Stool:900] Intake/Output from this shift: Total I/O In: 1094 [I.V.:210; NG/GT:480; IV Piggyback:404] Out: 1290 [Other:1290]  Labs:  [facility] 08/12/11 0502 08/11/11 1740 08/11/11 1704 08/11/11 0434  WBC 9.2 11.6* -- 10.8*  HGB 7.6* 7.5* -- 5.9*  PLT 10* 9* -- 7*  LABCREA -- -- -- --  CREATININE 1.34 -- 1.60* 1.47*   Estimated Creatinine Clearance: 66.3 ml/min (by C-G formula based on Cr of 1.34).  Basename 08/12/11 1310  VANCOTROUGH 17.2  VANCOPEAK --  VANCORANDOM --  GENTTROUGH --  GENTPEAK --  GENTRANDOM --  TOBRATROUGH --  TOBRAPEAK --  TOBRARND --  AMIKACINPEAK --  AMIKACINTROU --  AMIKACIN --     Assessment: 56 y.o. M transferred from [facility] with AMS and seizures activity on 7/9 and found to have R-SDH not requiring surgery. Acute renal failure currently on CVVHD.  Hematologic derangement currently being treated for TTP with plasmapheresis - nadir pltc 7 >>10.   Pt continues Vancomycin + Zosyn for empiric PNA coverage LLL opacties on CXR 7/9.   WBC trended back to WNL despite steroids.  Hypothermia (93.4) improved Tm 98.4.  Vancomycin trough 17.2  Hx Sz low DPH free level on admit ? Non-compliance pta -given recent MS change per family - Dilantin load 1250mg  x1 7/9 and dilantin 200mg  bid IV continued ( home dose).  Dilatin level checked 7/13 15.6 corrected for low alb (2.5)  = 26 - will decrease Goal of Therapy:  Vancomycin trough level 15-20 mcg/ml  Plan:    Continue Vancomycin  1000 mg IV every 24 h  Zosyn 3.375g IV every 6 h bolus Decrease dilantin 100mg  iv q8  - follow up increase dose  as pt improves   Leota Sauers Pharm.D. CPP, BCPS Clinical Pharmacist [phone] 08/12/2011 3:07 PM

## 2011-08-13 ENCOUNTER — Encounter (HOSPITAL_COMMUNITY): Payer: Self-pay | Admitting: Pulmonary Disease

## 2011-08-13 DIAGNOSIS — K7030 Alcoholic cirrhosis of liver without ascites: Secondary | ICD-10-CM

## 2011-08-13 DIAGNOSIS — E46 Unspecified protein-calorie malnutrition: Secondary | ICD-10-CM

## 2011-08-13 DIAGNOSIS — D509 Iron deficiency anemia, unspecified: Secondary | ICD-10-CM

## 2011-08-13 DIAGNOSIS — R188 Other ascites: Secondary | ICD-10-CM

## 2011-08-13 LAB — THERAPEUTIC PLASMA EXCHANGE (BLOOD BANK)
Unit division: 0
Unit division: 0
Unit division: 0
Unit division: 0
Unit division: 0
Unit division: 0
Unit division: 0

## 2011-08-13 LAB — CBC WITH DIFFERENTIAL/PLATELET
Eosinophils Absolute: 0 10*3/uL (ref 0.0–0.7)
Eosinophils Relative: 0 % (ref 0–5)
Lymphs Abs: 0.4 10*3/uL — ABNORMAL LOW (ref 0.7–4.0)
MCH: 31.2 pg (ref 26.0–34.0)
MCHC: 32.9 g/dL (ref 30.0–36.0)
MCV: 94.8 fL (ref 78.0–100.0)
Monocytes Absolute: 0.7 10*3/uL (ref 0.1–1.0)
Platelets: 12 10*3/uL — CL (ref 150–400)
RBC: 2.31 MIL/uL — ABNORMAL LOW (ref 4.22–5.81)

## 2011-08-13 LAB — COMPREHENSIVE METABOLIC PANEL
ALT: 18 U/L (ref 0–53)
Alkaline Phosphatase: 56 U/L (ref 39–117)
BUN: 14 mg/dL (ref 6–23)
CO2: 29 mEq/L (ref 19–32)
Calcium: 7.8 mg/dL — ABNORMAL LOW (ref 8.4–10.5)
GFR calc Af Amer: 71 mL/min — ABNORMAL LOW (ref >90)
GFR calc non Af Amer: 61 mL/min — ABNORMAL LOW (ref >90)
Glucose, Bld: 127 mg/dL — ABNORMAL HIGH (ref 70–99)
Potassium: 3.3 mEq/L — ABNORMAL LOW (ref 3.5–5.1)
Sodium: 139 mEq/L (ref 135–145)

## 2011-08-13 LAB — CULTURE, BLOOD (ROUTINE X 2)

## 2011-08-13 LAB — RENAL FUNCTION PANEL
CO2: 32 mEq/L (ref 19–32)
Calcium: 8.4 mg/dL (ref 8.4–10.5)
GFR calc Af Amer: 62 mL/min — ABNORMAL LOW (ref >90)
Glucose, Bld: 104 mg/dL — ABNORMAL HIGH (ref 70–99)
Sodium: 145 mEq/L (ref 135–145)

## 2011-08-13 LAB — HEPARIN INDUCED THROMBOCYTOPENIA PNL
Heparin Induced Plt Ab: NEGATIVE
Patient O.D.: 0.244
UFH SRA Result: NEGATIVE

## 2011-08-13 LAB — MAGNESIUM: Magnesium: 2.1 mg/dL (ref 1.5–2.5)

## 2011-08-13 MED ORDER — POTASSIUM CHLORIDE 10 MEQ/50ML IV SOLN
INTRAVENOUS | Status: AC
  Administered 2011-08-13: 10 meq
  Filled 2011-08-13: qty 50

## 2011-08-13 MED ORDER — K PHOS MONO-SOD PHOS DI & MONO 155-852-130 MG PO TABS
250.0000 mg | ORAL_TABLET | Freq: Three times a day (TID) | ORAL | Status: AC
  Administered 2011-08-13 (×3): 250 mg via ORAL
  Filled 2011-08-13 (×3): qty 1

## 2011-08-13 MED ORDER — CARVEDILOL 25 MG PO TABS
25.0000 mg | ORAL_TABLET | Freq: Two times a day (BID) | ORAL | Status: DC
  Administered 2011-08-13 – 2011-08-15 (×5): 25 mg via ORAL
  Filled 2011-08-13 (×7): qty 1

## 2011-08-13 MED ORDER — ACD FORMULA A 0.73-2.45-2.2 GM/100ML VI SOLN
Status: AC
  Administered 2011-08-13: 42 mL
  Filled 2011-08-13: qty 500

## 2011-08-13 MED ORDER — LISINOPRIL 20 MG PO TABS
20.0000 mg | ORAL_TABLET | Freq: Every day | ORAL | Status: DC
  Administered 2011-08-13 – 2011-08-15 (×3): 20 mg via ORAL
  Filled 2011-08-13 (×3): qty 1

## 2011-08-13 MED ORDER — POTASSIUM CHLORIDE 10 MEQ/100ML IV SOLN: 10.0000 meq | INTRAVENOUS | Status: DC

## 2011-08-13 MED ORDER — POTASSIUM CHLORIDE 20 MEQ/15ML (10%) PO LIQD
40.0000 meq | ORAL | Status: AC
  Administered 2011-08-13 – 2011-08-14 (×2): 40 meq via ORAL
  Filled 2011-08-13 (×2): qty 30

## 2011-08-13 MED ORDER — NICARDIPINE HCL IN NACL 20-0.86 MG/200ML-% IV SOLN
5.0000 mg/h | INTRAVENOUS | Status: DC
  Administered 2011-08-13: 5 mg/h via INTRAVENOUS
  Administered 2011-08-13 (×2): 8 mg/h via INTRAVENOUS
  Filled 2011-08-13 (×4): qty 200

## 2011-08-13 MED ORDER — AMLODIPINE BESYLATE 5 MG PO TABS
5.0000 mg | ORAL_TABLET | Freq: Every day | ORAL | Status: DC
  Administered 2011-08-13 – 2011-08-15 (×3): 5 mg via ORAL
  Filled 2011-08-13 (×3): qty 1

## 2011-08-13 MED ORDER — POTASSIUM CHLORIDE 10 MEQ/50ML IV SOLN
10.0000 meq | INTRAVENOUS | Status: AC
  Administered 2011-08-13: 10 meq via INTRAVENOUS
  Filled 2011-08-13 (×2): qty 50

## 2011-08-13 NOTE — Progress Notes (Signed)
08/13/2011, 7:48 AM  Hospital day: 7 Antibiotics: zosyn ~ day 6, vancomycin ~ day 6 Plasmaphoresis daily since 08-10-11, solumedrol since 08-10-11.   Subjective: Awake, orally intubated, less agitated this am per RN. Motions to mouth with hand (protective gloves).   Objective: Vital signs in last 24 hours: Blood pressure 114/78, pulse 83, temperature 98.7 F (37.1 C), temperature source Oral, resp. rate 14, height 5\' 11"  (1.803 m), weight 183 lb 6.8 oz (83.2 kg), SpO2 100.00%.   Intake/Output from previous day: 07/14 0701 - 07/15 0700 In: 3149 [I.V.:1155; NG/GT:1280; IV Piggyback:714] Out: 5788 [Stool:1000] Intake/Output this shift:    Physical exam: appears alert and NAD, orally intubated with CVHD ongoing. No apparent bleeding at any lines or tubes. Sclerae not icteric. Heart RRR. Respirations not labored, breath sounds heard bilaterally.  Abdomen soft, quiet. SCD on, feet warm.  Lab Results:  Basename 08/13/11 0430 08/12/11 0502  WBC 7.4 9.2  HGB 7.2* 7.6*  HCT 21.9* 22.2*  PLT 12* 10*   BMET  Basename 08/13/11 0430 08/12/11 0502  NA 139 142  K 3.3* 3.6  CL 102 105  CO2 29 27  GLUCOSE 127* 134*  BUN 14 11  CREATININE 1.29 1.34  CALCIUM 7.8* 7.3*  Tprot 5.8, alb 2.9 Tbili 0.6. Other LFTs ok LDH 231 Hapatoglobin pending today, 78 yesterday No positive cultures at [facility]. HIT pending B12 495 (7-13) RBC folate pending Studies/Results: US Abdomen Port  08/12/2011  *RADIOLOGY REPORT*  Clinical Data:  Thrombocytopenia.  Hepatitis C.  Alcohol abuse. Hypertension.  COMPLETE ABDOMINAL ULTRASOUND  Comparison:  08/07/2011 renal sonogram.  Findings:  Gallbladder:  No gallstones.  Gallbladder wall thickening measuring up to 5.2 mm.  Ascites with pericholecystic fluid.  The patient well not tender over this region during scanning.  Common bile duct:  3.4 mm.  Liver:  Diffusely echogenic with lobular contour consistent with cirrhosis.  No obvious mass.  Evaluation limited.  IVC:   Appears normal.  Pancreas:  Not visualized secondary to bowel gas.  Spleen:  8.1 cm.  Poorly delineated.  Right Kidney:  10.5 cm.  No hydronephrosis.  Increased renal echogenicity.  Left Kidney:  Not well delineated secondary to habitus and bowel gas.  Abdominal aorta:  Poorly delineated secondary to habitus and bowel gas.  IMPRESSION: Cirrhosis with ascites.  This is felt to be responsible for the pericholecystic fluid.  This limits detection of cholecystitis. Gallbladder wall thickening may be related to the cirrhosis.  No gallstones.  Secondary to habitus and bowel gas, poor delineation of the pancreas, left kidney and aorta.  Original Report Authenticated By: Fuller Canada, M.D.   Dg Chest Port 1 View  08/12/2011  *RADIOLOGY REPORT*  Clinical Data: Postop from craniotomy for subdural hematoma.  On ventilator.  Chronic renal failure  PORTABLE CHEST - 1 VIEW  Comparison: 08/11/2011  Findings: Support lines tubes remain in appropriate position.  Low lung volumes are again seen with bibasilar atelectasis which appears stable.  No new areas of pulmonary past year seen. Heart size is stable.  IMPRESSION: No significant change in low lung volumes and bibasilar atelectasis.  Original Report Authenticated By: Danae Orleans, M.D.   Dg Abd Portable 1v  08/12/2011  *RADIOLOGY REPORT*  Clinical Data: Feeding tube placement.  PORTABLE ABDOMEN - 1 VIEW  Comparison: 08/07/2011  Findings: Feeding tube tip projects over the proximal stomach. Dilated small bowel loops measuring up to 3.8 cm. There is some gas within colon however decreased in the interval.  A central venous catheter projects over the right pelvis.  Lung bases are excluded from the image.  No acute osseous finding.  IMPRESSION: Increased small bowel distension up to 3.8 cm. Small bowel obstruction not excluded.  Feeding tube tip projects over proximal stomach. Advancement recommended.  Original Report Authenticated By: Waneta Martins, M.D.      Assessment/Plan: 1.Thrombocytopenia: apparently acute on some baseline mild thrombocytopenia (140k from 05-2008). No apparent bleeding.  My partners over weekend agree that this is not obviously TTP and no hematologic improvement with plasmaphoresis thus far. Would consider stopping plasmaphoresis. Spleen volume not calculated on abd [place], but length 8.1 cm doubt splenomegaly; [place] did confirm cirrhotic changes of liver and ascites. No improvement with steroids thus far.  HIT pending. Could be medication related, including vancomycin; could antibiotics be adjusted?  Likely also bone marrow suppression from alcohol abuse. Would transfuse plt if bleeding or if other concerns re subdural. 2.Anemia:  Not clearly hemolyzing. Is iron deficient, with iron < 10 by lab 08-08-11. Likely multifactorial, including CKD.  Smear not available presently and I have asked path to review. Please call if needed prior to my next rounds.  Shanna Strength P [phone]

## 2011-08-13 NOTE — Progress Notes (Signed)
[facility] ADULT ICU REPLACEMENT PROTOCOL FOR AM LAB REPLACEMENT ONLY  The patient does not apply for the [facility] Adult ICU Electrolyte Replacment Protocol based on the criteria listed below:   1. Is GFR >/= 50 ml/min? yes  Patient's GFR today is 71 2. Is urine output >/= 0.5 ml/kg/hr for the last 8 hours? no Pt on CVVH 3. Is BUN < 30 mg/dL? yes  Patient's BUN today is 14 4. Abnormal electrolyte K 3.3 Phos 1.8  Hayden Rasmussen [facility] 08/13/2011 5:48 AM

## 2011-08-13 NOTE — Procedures (Signed)
Extubation Procedure Note  Patient Details:   Name: [name] DOB: Dec 21, 1955 MRN: [id number]   Airway Documentation:  Airway 7.5 mm (Active)    Evaluation  O2 sats: stable throughout Complications: No apparent complications Patient did tolerate procedure well. Bilateral Breath Sounds: Rhonchi Suctioning: Airway Yes Suctioned patient and extubayed. Pt now wearing nasal cannula with O2 running at 4lpm. Clearance Coots 08/13/2011, 12:27 PM

## 2011-08-13 NOTE — Progress Notes (Addendum)
Nursing: Dr. Molli Knock in [facility] notified of hypertension, which has persisted despite sedation efforts and CRRT -150 cc/hr. New orders received for Cardene gtt. Will continue to closely monitor. Alva Garnet, [facility] [place]

## 2011-08-13 NOTE — Progress Notes (Signed)
Name: [name] MRN: [id number] DOB: Dec 26, 1955    LOS: 6  Referring Provider:  Transferred from [facility] Reason for Referral:  Altered mental status  PULMONARY / CRITICAL CARE MEDICINE   Brief patient description: 56 yo brought to [facility] with altered mental status.  Head CT demonstrated small right subdural hematoma with 4 mm midline shift.  Labs demonstarted multiple metabolic derangements.Transferred to [facility] for farther management.  Interval/subjective 7/9    Brought to [facility] with altered mental status.  Head CT demonstrated small right subdural hematoma with 4 mm midline shift.  Labs demonstarted multiple metabolic derangements.Transferred to [facility] for farther management. 7/10  Had a seizure ~530 am this am, treated w/ 1 mg ativan. [id number] PCCM team called to bedside for eval of Persistent hypotension not responding to volume challenge and decrease in MS. On bedside eval he is unresponsive. Will bite down when attempting to illicit gag, but has no other response to noxious stimuli. Currently SBP in 60s. Intubated. Central line placed. Transferred to ICU due to continued hypotension of unknown origin. 7/11  Continuous EEG without seizure activity, asymetrical activity likely due to subdural, moderate encephalopathy-perhaps due to sedative medications. Only on intermittent sedation-infrequent use 7/12  Plasmapheresis started due to concern for TTP; pressors weaned off 7/15  Started on cardene gtt due to elevated BP  Current status: Following commands at this time, able to take deep breaths.  Vital Signs:  Reviewed  Temp:  [97.8 F (36.6 C)-99.1 F (37.3 C)] 98.7 F (37.1 C) (07/15 0400) Pulse Rate:  [67-91] 83  (07/15 0700) Resp:  [6-25] 14  (07/15 0700) BP: (109-171)/(75-116) 114/78 mmHg (07/15 0700) SpO2:  [98 %-100 %] 100 % (07/15 0700) FiO2 (%):  [30 %-40 %] 30.7 % (07/15 0700) Weight:  [183 lb 6.8 oz (83.2 kg)] 183 lb 6.8 oz (83.2 kg) (07/15 0500) Intake/Output      07/14 0701 - 07/15  0700 07/15 0701 - 07/16 0700   I.V. (mL/kg) 1155 (13.9) 320 (3.8)   Blood     NG/GT 1280 80   IV Piggyback 714 100   Total Intake(mL/kg) 3149 (37.8) 500 (6)   Other 4788 2450   Stool 1000    Total Output 5788 2450   Net -2639 -1950         Physical Examination: General:  Intubated. Reaching for tube and requesting for removal.  Neuro:  Opens eyes to stimulation, follows commands.  HEENT:  PERRL Neck:  Supple, no JVD Cardiovascular:  RRR, no murmurs Lungs:  CTAB Abdomen:  Soft, nontender, bowel sounds present Musculoskeletal:  no edema Skin:  Intact  ASSESSMENT AND PLAN  PULMONARY  Lab 08/10/11 0417 08/09/11 0454 08/08/11 0400 08/07/11 1013 08/07/11 0455  PHART 7.466* 7.536* 7.379 7.336* 7.382  PCO2ART 30.5* 23.3* 18.6* 16.0* 13.2*  PO2ART 105.0* 125.0* 166.0* 514.0* 160.0*  HCO3 22.3 20.3 10.7* 8.3* 7.6*  O2SAT 99.0 99.0 99.8 100.0 99.6   Ventilator Settings: Vent Mode:  [-] PRVC FiO2 (%):  [30 %-40 %] 30.7 % Set Rate:  [14 bmp] 14 bmp Vt Set:  [600 mL] 600 mL PEEP:  [5 cmH20] 5 cmH20 Pressure Support:  [5 cmH20-10 cmH20] 10 cmH20 Plateau Pressure:  [11 cmH20-29 cmH20] 29 cmH20  CXR: 7/15 >>> None today ETT: 7/9 >>> 7/15  A:  Acute respiratory failure (intubated for airway protection) P:   SBT today and consideration of extubation given improved mental status  CARDIOVASCULAR  Lab 08/10/11 0400 08/08/11 2115 08/08/11 1545 08/07/11 1155 08/07/11  0530  TROPONINI -- -- -- -- 0.39*  LATICACIDVEN 2.3* 2.3* -- 0.8 5.3*  PROBNP -- -- 3123.0* -- --   ECG:  NA TTE:  EF 75% Lines: Right IJ CVL 7/9>>> Left rad aline 7/9>>>7/15 PA-cath 7/10 >> 7/12  A: History of diastolic CHF, no evidence of exacerbation. Shock, now resolved. Now hypertensive. Demand ischemia. P: Wean Cardene gtt Restart home lisinopril, carvedilol, amlodipine   RENAL  Lab 08/13/11 0430 08/12/11 0502 08/11/11 2021 08/11/11 1704 08/11/11 0434 08/10/11 0310 08/09/11 0310  NA 139 142 -- 142  142 138 --  K 3.3* 3.6 -- -- -- -- --  CL 102 105 -- 104 108 107 --  CO2 29 27 -- 28 28 21  --  BUN 14 11 -- 14 12 13  --  CREATININE 1.29 1.34 -- 1.60* 1.47* 1.45* --  CALCIUM 7.8* 7.3* 7.3* 7.4* 6.9* -- --  MG 2.1 2.0 -- -- 2.0 1.9 1.7  PHOS 1.8* 1.7* -- 2.5 1.9* 1.1* --   Intake/Output      07/14 0701 - 07/15 0700 07/15 0701 - 07/16 0700   I.V. (mL/kg) 1155 (13.9)    Blood     NG/GT 1280    IV Piggyback 714    Total Intake(mL/kg) 3149 (37.8)    Other 4788    Stool 1000    Total Output 5788    Net -2639          Renal US:  7/9 Chronic medical renal disease.  Foley:  7/9 >>>  A:  Acute on chronic renal failure in setting of dehydration and suspected TTP. CKD and renal artery stenosis.  Gap/non-gap metabolic acidosis. Hypokalemia.  Hypophosphatemia. P: CVVH Renal following Replace K / Phos Trend BMP  GASTROINTESTINAL  Lab 08/13/11 0430 08/12/11 0502 08/11/11 2021 08/11/11 1704 08/11/11 0434 08/10/11 0310 08/09/11 0310  AST 26 33 -- -- 28 30 32  ALT 18 22 -- -- 21 27 28   ALKPHOS 56 54 -- -- 61 93 86  BILITOT 0.6 0.9 -- -- 0.4 0.4 0.6  PROT 5.8* 5.2* -- -- 5.0* 4.9* 4.6*  ALBUMIN 2.9* 2.5* 2.8* 2.8* 2.2* -- --   A:  History of hepatitis C.  Suspected malnutrition. Cirrhosis. ETOH abuse.  P:   Diet after swallow eval  HEMATOLOGIC  Lab 08/13/11 0430 08/12/11 0502 08/11/11 1740 08/11/11 0513 08/11/11 0434 08/10/11 0310 08/09/11 1102 08/09/11 0856  HGB 7.2* 7.6* 7.5* -- 5.9* 8.6* -- --  HCT 21.9* 22.2* 21.7* -- 17.4* 24.4* -- --  PLT 12* 10* 9* -- 7* 16* -- --  INR 1.13 -- -- 1.08 1.07 -- 1.17 1.30  APTT 30 29 -- -- 32 75* 188* --     A:  Progressive anemia. Thrombocytopenia.  Clinical syndrome now seems most consistent with TTP, especially with schistocytes on smear and negative DIC panel. Treated stress dose steroids.  P:  Hematology following Empirically treatment for TTP with plasmapheresis and steroids. 5 rounds planned, day 4 today.  Follow HIT panel PRBC  for Hgb < 7, Plt for < 10k  INFECTIOUS  Lab 08/13/11 0430 08/12/11 0502 08/11/11 1740 08/11/11 0434 08/10/11 0310 08/08/11 0350 08/07/11 1155 08/07/11 0530  WBC 7.4 9.2 11.6* 10.8* 20.1* -- -- --  PROCALCITON 0.40 -- -- -- 0.93 0.66 0.66 0.99   Cultures: 7/9  Blood>>>ntd 7/9  Sputum>>>ntd  Antibiotics: Vanc 7/10>>>7/15 Zosyn 7/10 >>7/15  A:  No strong evidence of acute infection. P:   Discontinue empiric abx as cultures negative  ENDOCRINE  Lab 08/08/11 1957 08/07/11 0424  GLUCAP 152* 88   A:  Risk for steroid induced hyperglycemia. P:   No intervention required  NEUROLOGIC  Head CT: 7/9  >>> Small right subdural hematoma with 4 mm midline shift.  Cont EEG: >>> No seizures A:  Acute encephalopathy.  History of alcohol abuse.  History of meningioma resection.  History of seizure disorder on Dilantin.  History of medical noncompliance. Had witnessed seizure in ICU. ETOH abuse.  AMS related to TTP. P:   Neurosurgery following >> no role intervention at this time Dilantin, Keppra continued Thiamine, Folate. Continue Lexapro. Will treat for TTP as above  DISPO: Status:  ICU Primary:  PCCM Code:  Full GI Px: Not indicated DVT Px:  SCDs  Tana Conch, MD, PGY2 08/13/2011 7:38 AM  Patient examined.  Records reviewed.  Assessment and plan above is edited and discussed with ICU Resident Team.  The patient is critically ill with multiple organ systems failure and requires high complexity decision making for assessment and support, frequent evaluation and titration of therapies, application of advanced monitoring technologies and extensive interpretation of multiple databases. Critical Care Time devoted to patient care services described in this note is 45 minutes.  Orlean Bradford, M.D., F.C.C.P. Pulmonary and Critical Care Medicine [facility] Cell: [phone] Pager: [phone]

## 2011-08-13 NOTE — Progress Notes (Signed)
Patient ID: [name], male   DOB: 03/08/1955, 56 y.o.   MRN: [id number] S:No recent changes O:BP 114/78  Pulse 83  Temp 98.7 F (37.1 C) (Oral)  Resp 14  Ht 5\' 11"  (1.803 m)  Wt 83.2 kg (183 lb 6.8 oz)  BMI 25.58 kg/m2  SpO2 100%  Intake/Output Summary (Last 24 hours) at 08/13/11 0819 Last data filed at 08/13/11 0700  Gross per 24 hour  Intake   3039 ml  Output   5600 ml  Net  -2561 ml   Weight change: -1.9 kg (-4 lb 3 oz) WUJ:WJXBJYNWGN ill appearing AAM intubated CVS:no precordial rub Resp:scattered rhonchi FAO:ZHYQMV Ext:+edema R>L lower ext   Lab 08/13/11 0430 08/12/11 0502 08/11/11 2021 08/11/11 1704 08/11/11 0434 08/10/11 0310 08/09/11 2110 08/09/11 1505 08/09/11 0310 08/08/11 2115 08/08/11 0350 08/07/11 0530  NA 139 142 -- 142 142 138 137 139 -- -- -- --  K 3.3* 3.6 -- 3.1* 3.2* 3.7 3.2* 3.2* -- -- -- --  CL 102 105 -- 104 108 107 106 109 -- -- -- --  CO2 29 27 -- 28 28 21 24 19  -- -- -- --  GLUCOSE 127* 134* -- 121* 129* 189* 157* 242* -- -- -- --  BUN 14 11 -- 14 12 13 15 18  -- -- -- --  CREATININE 1.29 1.34 -- 1.60* 1.47* 1.45* 1.49* 1.63* -- -- -- --  ALBUMIN 2.9* 2.5* 2.8* 2.8* 2.2* 1.1* -- -- 1.1* -- -- --  CALCIUM 7.8* 7.3* 7.3* 7.4* 6.9* 6.4* 6.5* -- -- -- -- --  PHOS 1.8* 1.7* -- 2.5 1.9* 1.1* -- -- 1.6* 1.9* -- --  AST 26 33 -- -- 28 30 -- -- 32 -- 42* 49*  ALT 18 22 -- -- 21 27 -- -- 28 -- 27 33   Liver Function Tests:  Lab 08/13/11 0430 08/12/11 0502 08/11/11 2021 08/11/11 0434  AST 26 33 -- 28  ALT 18 22 -- 21  ALKPHOS 56 54 -- 61  BILITOT 0.6 0.9 -- 0.4  PROT 5.8* 5.2* -- 5.0*  ALBUMIN 2.9* 2.5* 2.8* --   No results found for this basename: LIPASE:3,AMYLASE:3 in the last 168 hours  Lab 08/07/11 0530  AMMONIA 126*   CBC:  Lab 08/13/11 0430 08/12/11 0502 08/11/11 1740 08/11/11 0434 08/10/11 0310  WBC 7.4 9.2 11.6* -- --  NEUTROABS 6.3 8.0* -- 9.0* --  HGB 7.2* 7.6* 7.5* -- --  HCT 21.9* 22.2* 21.7* -- --  MCV 94.8 92.1 91.6 97.8  94.2  PLT 12* 10* 9* -- --   Cardiac Enzymes:  Lab 08/08/11 2115 08/07/11 0530  CKTOTAL 502* 1066*  CKMB -- 7.0*  CKMBINDEX -- --  TROPONINI -- 0.39*   CBG:  Lab 08/08/11 1957 08/07/11 0424  GLUCAP 152* 88    Iron Studies: No results found for this basename: IRON,TIBC,TRANSFERRIN,FERRITIN in the last 72 hours Studies/Results: US Abdomen Port  08/12/2011  *RADIOLOGY REPORT*  Clinical Data:  Thrombocytopenia.  Hepatitis C.  Alcohol abuse. Hypertension.  COMPLETE ABDOMINAL ULTRASOUND  Comparison:  08/07/2011 renal sonogram.  Findings:  Gallbladder:  No gallstones.  Gallbladder wall thickening measuring up to 5.2 mm.  Ascites with pericholecystic fluid.  The patient well not tender over this region during scanning.  Common bile duct:  3.4 mm.  Liver:  Diffusely echogenic with lobular contour consistent with cirrhosis.  No obvious mass.  Evaluation limited.  IVC:  Appears normal.  Pancreas:  Not visualized secondary to bowel gas.  Spleen:  8.1 cm.  Poorly delineated.  Right Kidney:  10.5 cm.  No hydronephrosis.  Increased renal echogenicity.  Left Kidney:  Not well delineated secondary to habitus and bowel gas.  Abdominal aorta:  Poorly delineated secondary to habitus and bowel gas.  IMPRESSION: Cirrhosis with ascites.  This is felt to be responsible for the pericholecystic fluid.  This limits detection of cholecystitis. Gallbladder wall thickening may be related to the cirrhosis.  No gallstones.  Secondary to habitus and bowel gas, poor delineation of the pancreas, left kidney and aorta.  Original Report Authenticated By: Fuller Canada, M.D.   Dg Chest Port 1 View  08/12/2011  *RADIOLOGY REPORT*  Clinical Data: Postop from craniotomy for subdural hematoma.  On ventilator.  Chronic renal failure  PORTABLE CHEST - 1 VIEW  Comparison: 08/11/2011  Findings: Support lines tubes remain in appropriate position.  Low lung volumes are again seen with bibasilar atelectasis which appears stable.  No new  areas of pulmonary past year seen. Heart size is stable.  IMPRESSION: No significant change in low lung volumes and bibasilar atelectasis.  Original Report Authenticated By: Danae Orleans, M.D.   Dg Abd Portable 1v  08/12/2011  *RADIOLOGY REPORT*  Clinical Data: Feeding tube placement.  PORTABLE ABDOMEN - 1 VIEW  Comparison: 08/07/2011  Findings: Feeding tube tip projects over the proximal stomach. Dilated small bowel loops measuring up to 3.8 cm. There is some gas within colon however decreased in the interval.  A central venous catheter projects over the right pelvis.  Lung bases are excluded from the image.  No acute osseous finding.  IMPRESSION: Increased small bowel distension up to 3.8 cm. Small bowel obstruction not excluded.  Feeding tube tip projects over proximal stomach. Advancement recommended.  Original Report Authenticated By: Waneta Martins, M.D.      . anticoagulant sodium citrate  5 mL Intracatheter Once  . antiseptic oral rinse  15 mL Mouth Rinse QID  . calcium gluconate  2 g Intravenous Once  . chlorhexidine  15 mL Mouth/Throat BID  . escitalopram  20 mg Oral Daily  . feeding supplement (OSMOLITE 1.2 CAL)  1,000 mL Per Tube Q24H  . feeding supplement  60 mL Per Tube TID WC  . folic acid  1 mg Oral Daily  . levetiracetam  500 mg Intravenous Q12H  . methylPREDNISolone (SOLU-MEDROL) injection  125 mg Intravenous Q12H  . pantoprazole sodium  40 mg Per Tube Q1200  . phenytoin (DILANTIN) IV  100 mg Intravenous Q8H  . piperacillin-tazobactam  3.375 g Intravenous Q6H  . thiamine  100 mg Oral Daily  . vancomycin  1,000 mg Intravenous Q24H  . DISCONTD: therapeutic plasma exchange solution   Dialysis Once in dialysis  . DISCONTD: anticoagulant sodium citrate  5 mL Intracatheter Once  . DISCONTD: calcium gluconate  2 g Intravenous Once  . DISCONTD: chlorhexidine  15 mL Mouth/Throat BID  . DISCONTD: pantoprazole (PROTONIX) IV  40 mg Intravenous Q24H  . DISCONTD: phenytoin  (DILANTIN) IV  200 mg Intravenous Q12H    BMET    Component Value Date/Time   NA 139 08/13/2011 0430   K 3.3* 08/13/2011 0430   CL 102 08/13/2011 0430   CO2 29 08/13/2011 0430   GLUCOSE 127* 08/13/2011 0430   BUN 14 08/13/2011 0430   CREATININE 1.29 08/13/2011 0430   CALCIUM 7.8* 08/13/2011 0430   GFRNONAA 61* 08/13/2011 0430   GFRAA 71* 08/13/2011 0430   CBC    Component Value  Date/Time   WBC 7.4 08/13/2011 0430   RBC 2.31* 08/13/2011 0430   HGB 7.2* 08/13/2011 0430   HCT 21.9* 08/13/2011 0430   PLT 12* 08/13/2011 0430   MCV 94.8 08/13/2011 0430   MCH 31.2 08/13/2011 0430   MCHC 32.9 08/13/2011 0430   RDW 21.2* 08/13/2011 0430   LYMPHSABS 0.4* 08/13/2011 0430   MONOABS 0.7 08/13/2011 0430   EOSABS 0.0 08/13/2011 0430   BASOSABS 0.0 08/13/2011 0430     Assessment/Plan:  1. AKI/CKD- remains anuric and requiring CVVHD.   2. Thrombocytopenia- ?TTP vs drug-induced vs chronic liver disease +/- Hep C.  Day #4 of plasmapheresis planned for today.  No significant change in plt count.  Heme recommends stopping PE's and adjust abx.  Will plan on completing 5 treatments and reassess.  Agree with plt transfusion if bleeding occurs. 3. Shock/SIRS- per PCCM.  Wean levophed as able 4. VDRF- on vent per PCCM 5. Metabolic acidosis- resolved with CVVHD 6. Hypokalemia- cont to replete 7. Cirrhosis/Etoh/Hep C-  8. CHF- volume excess- tolerating UF with CVVHD 9. AMS- slight improvement per nsg 10. ID- on vanc/zosyn. Follow trough levels.  [place] A

## 2011-08-13 NOTE — Progress Notes (Signed)
TPE complete. Volume exchanged =3900. VS stable after reinfusion, Lona Kettle given report. Family with patient

## 2011-08-14 LAB — THERAPEUTIC PLASMA EXCHANGE (BLOOD BANK)
Unit division: 0
Unit division: 0
Unit division: 0
Unit division: 0
Unit division: 0
Unit division: 0

## 2011-08-14 LAB — RENAL FUNCTION PANEL
CO2: 29 mEq/L (ref 19–32)
Chloride: 104 mEq/L (ref 96–112)
Creatinine, Ser: 1.25 mg/dL (ref 0.50–1.35)
GFR calc Af Amer: 73 mL/min — ABNORMAL LOW (ref >90)
GFR calc non Af Amer: 63 mL/min — ABNORMAL LOW (ref >90)
Sodium: 141 mEq/L (ref 135–145)

## 2011-08-14 LAB — PATHOLOGIST SMEAR REVIEW

## 2011-08-14 LAB — CBC
HCT: 22.3 % — ABNORMAL LOW (ref 39.0–52.0)
Hemoglobin: 7.2 g/dL — ABNORMAL LOW (ref 13.0–17.0)
MCH: 31.6 pg (ref 26.0–34.0)
MCV: 97.8 fL (ref 78.0–100.0)
RBC: 2.28 MIL/uL — ABNORMAL LOW (ref 4.22–5.81)

## 2011-08-14 MED ORDER — LABETALOL HCL 5 MG/ML IV SOLN: 5.0000 mg | INTRAVENOUS | Status: DC | PRN

## 2011-08-14 MED ORDER — HEPARIN SODIUM (PORCINE) 1000 UNIT/ML IJ SOLN
INTRAMUSCULAR | Status: AC
  Administered 2011-08-14: 17:00:00
  Filled 2011-08-14: qty 1

## 2011-08-14 MED ORDER — K PHOS MONO-SOD PHOS DI & MONO 155-852-130 MG PO TABS
250.0000 mg | ORAL_TABLET | Freq: Once | ORAL | Status: AC
  Administered 2011-08-14: 250 mg via ORAL
  Filled 2011-08-14: qty 1

## 2011-08-14 MED ORDER — HEPARIN SODIUM (PORCINE) 1000 UNIT/ML IJ SOLN
INTRAMUSCULAR | Status: AC
  Administered 2011-08-14: 17:00:00
  Filled 2011-08-14: qty 2

## 2011-08-14 NOTE — Progress Notes (Signed)
OT Cancellation Note  Treatment cancelled today due to patient receiving procedure or test. Will re attempt as able later this afternoon or next day.   [name] 08/14/2011, 11:04 AM

## 2011-08-14 NOTE — Progress Notes (Signed)
PT Cancellation Note  Treatment cancelled today due to medical issues with patient which prohibited therapy. Pt with temporary femoral HD cath.  This will need to be removed prior to PT assessing mobility.  [name] 08/14/2011, 5:07 PM

## 2011-08-14 NOTE — Progress Notes (Signed)
PT Cancellation Note  Treatment cancelled today due to pt receiving HD.Marland Kitchen  [name] 08/14/2011, 11:09 AM

## 2011-08-14 NOTE — Progress Notes (Signed)
Clinical Social Worker received referral from RN indicating that pt may benefit from SNF at time of dc.  CSW reviewed chart and met with pt at bedside.  Pt oriented to person, but stated the year was "2016" and when asked where he was, pt stated, "[place]".  Pt does not appear to be fully oriented.  CSW left voicemail message with pt's aunt.  CSW to continue to follow and assist as needed.   Angelia Mould, MSW, [place] [phone]

## 2011-08-14 NOTE — Progress Notes (Signed)
Medical Oncology  Status reviewed; patient having line removed. Extubated 08-13-11.  No reports of bleeding per nursing staff. Plasmaphoresis discontinued. Off vancomycin since 08-13-11. Continues solumedrol 125 mg q 12 hrs.  HIT negative.  CBC today with WBC 7.5 on steroids, Hgb stable over past 24 hrs at 7.2, plt 15k. Note no schistocytes seen on path review of peripheral smear on 08-13-11 and no giant platelets called. No apparent bleeding. Alert and conversant with staff.  Assessment/ recommendations: 1.Thrombocytopenia: likely multifactorial including Etoh and possibly medications (vanc or other). Follow, transfuse if bleeding. 2.Anemia: also likely multifactorial with renal disease,  Etoh and iron deficient by labs 08-08-11. Suggest Feraheme or other IV preparation per nephrology.  Jama Flavors, MD [phone]

## 2011-08-14 NOTE — Care Management Note (Signed)
    Page 1 of 2   08/15/2011     4:40:40 PM   CARE MANAGEMENT NOTE 08/15/2011  Patient:  [name], [name]   Account Number:  [id number]  Date Initiated:  08/07/2011  Documentation initiated by:  [facility]  Subjective/Objective Assessment:   Admitted with acute encephalopathy, SDH.     Action/Plan:   Anticipated DC Date:  08/10/2011   Anticipated DC Plan:  HOME W HOME HEALTH SERVICES      DC Planning Services  CM consult      Choice offered to / List presented to:             Status of service:  Completed, signed off Medicare Important Message given?   (If response is "NO", the following Medicare IM given date fields will be blank) Date Medicare IM given:   Date Additional Medicare IM given:    Discharge Disposition:  LONG TERM ACUTE CARE (LTAC)  Per UR Regulation:  Reviewed for med. necessity/level of care/duration of stay  If discussed at Long Length of Stay Meetings, dates discussed:    Comments:  08/15/11- 1000- Donn Pierini RN, BSN [phone] Received word from [facility] that [facility] will accept pt and have a bed for today for admission- Dr. Delton Coombes notified- call made to [facility] with [facility] to confirm that they will accept pt with a temp. HD cath and manage pt for perm. cath placement- return call from Boneta Lucks confirmed that [facility] will take pt with temp. cath- Dr. Delton Coombes informed of this- and pt to d/c to [facility] today.  08-14-11 2:30pm Avie Arenas, RNBSN [phone] Extubated - aunt Maggie from [place] in room.  Patient states has medicare, Celine Ahr has applied for Medicaid in past for him.  Financial counselor notified.  Found patient does have Medicare.  Aunt and patient both agreeable for Ltach - prefer the ltach upstairs - [facility] - consult placed.  08-13-11 - 2:50pm Avie Arenas, RNBSN [phone] Trying to wean off.  On CRRT/plasmaphoresis - started on cardene drip today.   08-10-11 12:30am Avie Arenas, RNBSN [phone] Appears to be waking up, still on vent - trying to wean -  CRRT, dc'ing swan - continues with Aline.  Asked for PT/OT orders.

## 2011-08-14 NOTE — Progress Notes (Signed)
Patient ID: [name], male   DOB: [birth year], 56 y.o.   MRN: [id number] S:pt is extubated, awake/alert/oriented and without complaints O:BP 177/111  Pulse 72  Temp 97.5 F (36.4 C) (Oral)  Resp 18  Ht 5\' 11"  (1.803 m)  Wt 80.2 kg (176 lb 12.9 oz)  BMI 24.66 kg/m2  SpO2 99%  Intake/Output Summary (Last 24 hours) at 08/14/11 4098 Last data filed at 08/14/11 0900  Gross per 24 hour  Intake   1309 ml  Output   5040 ml  Net  -3731 ml   Intake/Output: I/O last 3 completed shifts: In: 3169 [I.V.:1855; NG/GT:800; IV Piggyback:514] Out: 8866 [JXBJY:7829; Stool:2050]  Intake/Output this shift:  Total I/O In: 80 [I.V.:80] Out: 183 [Other:183] Weight change: 0.1 kg (3.5 oz) Gen:WD WN AAM inNAD CVS:RRR Resp:CTA FAO:ZHYQMV Ext:2+edema bilaterally   Lab 08/14/11 0500 08/13/11 1758 08/13/11 0430 08/12/11 0502 08/11/11 2021 08/11/11 1704 08/11/11 0434 08/10/11 0310 08/09/11 0310 08/08/11 0350  NA 141 145 139 142 -- 142 142 138 -- --  K 4.5 3.2* 3.3* 3.6 -- 3.1* 3.2* 3.7 -- --  CL 104 104 102 105 -- 104 108 107 -- --  CO2 29 32 29 27 -- 28 28 21  -- --  GLUCOSE 92 104* 127* 134* -- 121* 129* 189* -- --  BUN 14 17 14 11  -- 14 12 13  -- --  CREATININE 1.25 1.43* 1.29 1.34 -- 1.60* 1.47* 1.45* -- --  ALBUMIN 3.0* 3.0* 2.9* 2.5* 2.8* 2.8* 2.2* -- -- --  CALCIUM 8.2* 8.4 7.8* 7.3* 7.3* 7.4* 6.9* -- -- --  PHOS 2.2* 2.0* 1.8* 1.7* -- 2.5 1.9* 1.1* -- --  AST -- -- 26 33 -- -- 28 30 32 42*  ALT -- -- 18 22 -- -- 21 27 28 27    Liver Function Tests:  Lab 08/14/11 0500 08/13/11 1758 08/13/11 0430 08/12/11 0502 08/11/11 0434  AST -- -- 26 33 28  ALT -- -- 18 22 21   ALKPHOS -- -- 56 54 61  BILITOT -- -- 0.6 0.9 0.4  PROT -- -- 5.8* 5.2* 5.0*  ALBUMIN 3.0* 3.0* 2.9* -- --   No results found for this basename: LIPASE:3,AMYLASE:3 in the last 168 hours No results found for this basename: AMMONIA:3 in the last 168 hours CBC:  Lab 08/14/11 0500 08/13/11 0430 08/12/11 0502 08/11/11 1740  08/11/11 0434  WBC 7.5 7.4 9.2 -- --  NEUTROABS -- 6.3 8.0* -- 9.0*  HGB 7.2* 7.2* 7.6* -- --  HCT 22.3* 21.9* 22.2* -- --  MCV 97.8 94.8 92.1 91.6 97.8  PLT 15* 12* 10* -- --   Cardiac Enzymes:  Lab 08/08/11 2115  CKTOTAL 502*  CKMB --  CKMBINDEX --  TROPONINI --   CBG:  Lab 08/08/11 1957  GLUCAP 152*    Iron Studies: No results found for this basename: IRON,TIBC,TRANSFERRIN,FERRITIN in the last 72 hours Studies/Results: No results found.    Marland Kitchen amLODipine  5 mg Oral Daily  . anticoagulant sodium citrate  5 mL Intracatheter Once  . antiseptic oral rinse  15 mL Mouth Rinse QID  . calcium gluconate  2 g Intravenous Once  . carvedilol  25 mg Oral BID WC  . citrate dextrose      . escitalopram  20 mg Oral Daily  . folic acid  1 mg Oral Daily  . levetiracetam  500 mg Intravenous Q12H  . lisinopril  20 mg Oral Daily  . methylPREDNISolone (SOLU-MEDROL) injection  125 mg Intravenous  Q12H  . phenytoin (DILANTIN) IV  100 mg Intravenous Q8H  . phosphorus  250 mg Oral TID  . phosphorus  250 mg Oral Once  . potassium chloride  10 mEq Intravenous Q1 Hr x 3  . potassium chloride      . potassium chloride  40 mEq Oral Q4H  . thiamine  100 mg Oral Daily  . DISCONTD: chlorhexidine  15 mL Mouth/Throat BID  . DISCONTD: feeding supplement (OSMOLITE 1.2 CAL)  1,000 mL Per Tube Q24H  . DISCONTD: feeding supplement  60 mL Per Tube TID WC  . DISCONTD: pantoprazole sodium  40 mg Per Tube Q1200  . DISCONTD: piperacillin-tazobactam  3.375 g Intravenous Q6H  . DISCONTD: potassium chloride  10 mEq Intravenous Q1 Hr x 3  . DISCONTD: vancomycin  1,000 mg Intravenous Q24H    BMET    Component Value Date/Time   NA 141 08/14/2011 0500   K 4.5 08/14/2011 0500   CL 104 08/14/2011 0500   CO2 29 08/14/2011 0500   GLUCOSE 92 08/14/2011 0500   BUN 14 08/14/2011 0500   CREATININE 1.25 08/14/2011 0500   CALCIUM 8.2* 08/14/2011 0500   GFRNONAA 63* 08/14/2011 0500   GFRAA 73* 08/14/2011 0500   CBC      Component Value Date/Time   WBC 7.5 08/14/2011 0500   RBC 2.28* 08/14/2011 0500   HGB 7.2* 08/14/2011 0500   HCT 22.3* 08/14/2011 0500   PLT 15* 08/14/2011 0500   MCV 97.8 08/14/2011 0500   MCH 31.6 08/14/2011 0500   MCHC 32.3 08/14/2011 0500   RDW 21.1* 08/14/2011 0500   LYMPHSABS 0.4* 08/13/2011 0430   MONOABS 0.7 08/13/2011 0430   EOSABS 0.0 08/13/2011 0430   BASOSABS 0.0 08/13/2011 0430     Assessment/Plan:  1. AKI/CKD- had advanced CKD at baseline (stage3-4) now likely ischemic ATN due to SIRS.  Continue with HD for now.  Will transition to IHD and stop CVVHD later today after 1 Liter UF.  Will increase UF rate due to edema and HTN.  Discussed with pt and his "mother" that there is a chance that HD will be a longterm consequence. Will check bladder scan and follow UOP/Scr between HD 2. Thrombocytopenia- s/p plasma exchange daily for 4 days without sig change 10-15.  Low suspicion for TTP by Hematology.  Will d/c daily pheresis and follow.  HIT panel is negative.  Likely due to Etoh/Hep/cirrhosis 3. Anemia of chronic disease vs ABLA- on epo and transfuse for Hgb <7 4. HTN- increase UF, eval swallowing and start po meds 5. DM- per primary svc 6. F/E/N- d/c oral Kcl  And follow 7. Hypophosphatemia- started on oral neutra phos and should improve with improved po intake. 8. Volume excess- cont with UF and consider starting lasix IV if UOP improves. 9. SIRS- unclear etiology. All cx's negative 10. AMS/SDH- improved  Mara Favero A

## 2011-08-14 NOTE — Progress Notes (Deleted)
PT Cancellation Note  Treatment cancelled today due to medical issues with patient which prohibited therapy. Waiting for doppler for UE and with new DVT LE.    [name] 08/14/2011, 11:12 AM

## 2011-08-14 NOTE — Evaluation (Signed)
Clinical/Bedside Swallow Evaluation Patient Details  Name: [name] SHEHADEH MRN: [id number] Date of Birth: 12/11/1955  Today's Date: 08/14/2011 Time: 0945-1000 SLP Time Calculation (min): 15 min  Past Medical History:  Past Medical History  Diagnosis Date  . HTN (hypertension), malignant   . CKD (chronic kidney disease), stage III   . Renal artery stenosis     moderate  . Meningioma     s/p resection  . Seizure disorder   . Alcohol abuse     hx  . TIA (transient ischemic attack)   . Diastolic CHF, acute     acute?; echo 7/10 with EF 65%, moderate LVH  . HCV (hepatitis C virus)    Past Surgical History:  Past Surgical History  Procedure Date  . Brain surgery     meningioma resection   HPI:  56 yo brought to [facility] with altered mental status.  Head CT demonstrated small right subdural hematoma with 4 mm midline shift.  Labs demonstarted multiple metabolic derangements.Transferred to [facility] for further management. S/p seizure activity on 7/10, intubated due to changes in mentation. Extubated 7/15.    Assessment / Plan / Recommendation Clinical Impression  Patient presents with a functional oropharyngeal swallow without overt indication of aspiration however risk increased due to subacute CVA, mild oral weakness, and impulsivity (? new vs baseline decreased cognitive status, will continue to f/u for need for evaluation). Recommend regular diet with aspiration precautions.     Aspiration Risk  Mild    Diet Recommendation Regular;Thin liquid   Liquid Administration via: Cup;Straw Medication Administration: Whole meds with liquid Supervision: Patient able to self feed;Full supervision/cueing for compensatory strategies Compensations: Slow rate;Small sips/bites;Check for pocketing (left pocketing) Postural Changes and/or Swallow Maneuvers: Seated upright 90 degrees    Other  Recommendations Oral Care Recommendations: Oral care BID   Follow Up Recommendations  None    Frequency  and Duration min 2x/week  2 weeks   Pertinent Vitals/Pain n/a    SLP Swallow Goals Patient will consume recommended diet without observed clinical signs of aspiration with: Supervision/safety Swallow Study Goal #1 - Progress: Not Met Patient will utilize recommended strategies during swallow to increase swallowing safety with: Supervision/safety Swallow Study Goal #2 - Progress: Not met   Swallow Study    General HPI: 56 yo brought to [facility] with altered mental status.  Head CT demonstrated small right subdural hematoma with 4 mm midline shift.  Labs demonstarted multiple metabolic derangements.Transferred to [facility] for further management. S/p seizure activity on 7/10, intubated due to changes in mentation. Extubated 7/15.  Type of Study: Bedside swallow evaluation Diet Prior to this Study: NPO Temperature Spikes Noted: No Respiratory Status: Room air History of Recent Intubation: Yes Length of Intubations (days): 5 days Date extubated: 08/13/11 Behavior/Cognition: Alert;Cooperative;Pleasant mood;Impulsive;Distractible;Requires cueing;Decreased sustained attention Oral Cavity - Dentition: Adequate natural dentition Self-Feeding Abilities: Able to feed self Patient Positioning: Partially reclined (due to femoral dialysis line) Baseline Vocal Quality: Hoarse;Low vocal intensity Volitional Cough: Strong Volitional Swallow: Able to elicit    Oral/Motor/Sensory Function Overall Oral Motor/Sensory Function: Impaired Labial ROM: Reduced left Labial Symmetry: Abnormal symmetry left Labial Strength: Reduced Labial Sensation: Within Functional Limits Lingual ROM: Within Functional Limits Lingual Symmetry: Abnormal symmetry left Lingual Strength: Reduced Lingual Sensation: Within Functional Limits Facial ROM: Within Functional Limits Facial Symmetry: Left droop Facial Strength: Within Functional Limits Facial Sensation: Within Functional Limits Velum: Within Functional Limits Mandible:  Within Functional Limits   Ice Chips Ice chips: Not tested   Thin  Liquid Thin Liquid: Within functional limits Presentation: Cup;Straw;Self Fed    Nectar Thick Nectar Thick Liquid: Not tested   Honey Thick Honey Thick Liquid: Not tested   Puree Puree: Within functional limits Presentation: Spoon   Solid Solid: Within functional limits Presentation: Self Fed   [name] Nappier MA, CCC-SLP [phone]  [name] 08/14/2011,10:13 AM

## 2011-08-14 NOTE — Progress Notes (Signed)
Name: [name] MRN: [id number] DOB: 08-Mar-1955    LOS: 7  Referring Provider:  Transferred from [facility] Reason for Referral:  Altered mental status  PULMONARY / CRITICAL CARE MEDICINE   Brief patient description: 56 yo with a history of medication noncompliance for HTN and CKD Stage III brought to [facility] with altered mental status with a Head CT demonstrated small right subdural hematoma with 4 mm midline shift (no intervention per neurosurgery) noted to have a metabolic acidosis from acute on chronic renal failure whose mental status has improved with CVVHD. Also some concern for TTP so patient received 4 days of HD, but hem-onc thinks alternate etiology (ethanol abuse, meds) more likely.   Interval/subjective 7/9    Brought to [facility] with altered mental status.  Head CT demonstrated small right subdural hematoma with 4 mm midline shift.  Labs demonstarted multiple metabolic derangements.Transferred to [facility] for farther management. 7/10  Had a seizure ~530 am this am, treated w/ 1 mg ativan. [phone] PCCM team called to bedside for eval of Persistent hypotension not responding to volume challenge and decrease in MS. On bedside eval he is unresponsive. Will bite down when attempting to illicit gag, but has no other response to noxious stimuli. Currently SBP in 60s. Intubated. Central line placed. Transferred to ICU due to continued hypotension of unknown origin. 7/11  Continuous EEG without seizure activity, asymetrical activity likely due to subdural, moderate encephalopathy-perhaps due to sedative medications. Only on intermittent sedation-infrequent use 7/12  Plasmapheresis started due to concern for TTP; pressors weaned off 7/14  Started on cardene gtt due to elevated BP 7/15 extubated, cardene gtt stopped  Current status: oriented to person, place, month, seasons. Disoriented to year and day/date. Pulled 4 Panda tubes overnight-swallow study ordered this AM BP remains elevated  Vital Signs:   Reviewed  Temp:  [97.6 F (36.4 C)-98.7 F (37.1 C)] 97.6 F (36.4 C) (07/16 0358) Pulse Rate:  [69-87] 73  (07/16 0700) Resp:  [0-21] 17  (07/16 0700) BP: (85-179)/(61-109) 167/105 mmHg (07/16 0700) SpO2:  [97 %-100 %] 99 % (07/16 0700) FiO2 (%):  [30.2 %-30.5 %] 30.4 % (07/15 1200) Weight:  [176 lb 12.9 oz (80.2 kg)-183 lb 10.3 oz (83.3 kg)] 176 lb 12.9 oz (80.2 kg) (07/16 0442) Intake/Output      07/15 0701 - 07/16 0700 07/16 0701 - 07/17 0700   I.V. (mL/kg) 1070 (13.3)    NG/GT 200    IV Piggyback 309    Total Intake(mL/kg) 1579 (19.7)    Other 4434    Stool 1050    Total Output 5484    Net -3905          Physical Examination: General: NAD, resting in bed Neuro:  Follows commands, answers most questions appropriately HEENT:  PERRL Neck:  Supple, no JVD Cardiovascular:  RRR, no murmurs Lungs:  CTAB Abdomen:  Soft, nontender, bowel sounds present Musculoskeletal:  no edema Skin:  Intact  ASSESSMENT AND PLAN  PULMONARY  Lab 08/10/11 0417 08/09/11 0454 08/08/11 0400 08/07/11 1013  PHART 7.466* 7.536* 7.379 7.336*  PCO2ART 30.5* 23.3* 18.6* 16.0*  PO2ART 105.0* 125.0* 166.0* 514.0*  HCO3 22.3 20.3 10.7* 8.3*  O2SAT 99.0 99.0 99.8 100.0   Ventilator Settings: Vent Mode:  [-] CPAP FiO2 (%):  [30.2 %-30.5 %] 30.4 % PEEP:  [5 cmH20] 5 cmH20 Pressure Support:  [5 cmH20] 5 cmH20  CXR: 7/15 >>> None today ETT: 7/9 >>> 7/15  A:  Acute respiratory failure secondary to AMS-now  resolved. S/p extubation P:   Supplemental o2 for sats >92%-none required  CARDIOVASCULAR  Lab 08/10/11 0400 08/08/11 2115 08/08/11 1545 08/07/11 1155  TROPONINI -- -- -- --  LATICACIDVEN 2.3* 2.3* -- 0.8  PROBNP -- -- 3123.0* --   ECG:  NA TTE:  EF 75% Lines: Right IJ CVL 7/9>>> Left rad aline 7/9>>>7/15 PA-cath 7/10 >> 7/12 PA-cath port 7/10 >>  A: History of diastolic CHF, no evidence of exacerbation. Shock, now resolved. Now hypertensive. Demand ischemia. P: D/c pa cath  introducer Restarted home lisinopril, carvedilol, amlodipine 7/15-awaiting swallow study to receive this AM Labetalol prn for bp 160/100  RENAL  Lab 08/14/11 0500 08/13/11 1758 08/13/11 0430 08/12/11 0502 08/11/11 2021 08/11/11 1704 08/11/11 0434 08/10/11 0310  NA 141 145 139 142 -- 142 -- --  K 4.5 3.2* -- -- -- -- -- --  CL 104 104 102 105 -- 104 -- --  CO2 29 32 29 27 -- 28 -- --  BUN 14 17 14 11  -- 14 -- --  CREATININE 1.25 1.43* 1.29 1.34 -- 1.60* -- --  CALCIUM 8.2* 8.4 7.8* 7.3* 7.3* -- -- --  MG 2.2 -- 2.1 2.0 -- -- 2.0 1.9  PHOS 2.2* 2.0* 1.8* 1.7* -- 2.5 -- --   Intake/Output      07/15 0701 - 07/16 0700 07/16 0701 - 07/17 0700   I.V. (mL/kg) 1070 (13.3)    NG/GT 200    IV Piggyback 309    Total Intake(mL/kg) 1579 (19.7)    Other 4434    Stool 1050    Total Output 5484    Net -3905          Renal US:  7/9 Chronic medical renal disease.  Foley:  7/9 >>>  A:  Acute on chronic renal failure in setting of dehydration and suspected TTP. CKD and renal artery stenosis. Hypokalemia, resolved.  Hypophosphatemia. P: CVVH-->discuss with renal transition to HD Renal following Phos repletion Trend BMP  GASTROINTESTINAL  Lab 08/14/11 0500 08/13/11 1758 08/13/11 0430 08/12/11 0502 08/11/11 2021 08/11/11 0434 08/10/11 0310 08/09/11 0310  AST -- -- 26 33 -- 28 30 32  ALT -- -- 18 22 -- 21 27 28   ALKPHOS -- -- 56 54 -- 61 93 86  BILITOT -- -- 0.6 0.9 -- 0.4 0.4 0.6  PROT -- -- 5.8* 5.2* -- 5.0* 4.9* 4.6*  ALBUMIN 3.0* 3.0* 2.9* 2.5* 2.8* -- -- --   A:  History of hepatitis C.  Suspected malnutrition. Cirrhosis. ETOH abuse.  P:   Diet after swallow eval  HEMATOLOGIC  Lab 08/14/11 0500 08/13/11 0430 08/12/11 0502 08/11/11 1740 08/11/11 0513 08/11/11 0434 08/10/11 0310 08/09/11 1102 08/09/11 0856  HGB 7.2* 7.2* 7.6* 7.5* -- 5.9* -- -- --  HCT 22.3* 21.9* 22.2* 21.7* -- 17.4* -- -- --  PLT 15* 12* 10* 9* -- 7* -- -- --  INR -- 1.13 -- -- 1.08 1.07 -- 1.17 1.30  APTT --  30 29 -- -- 32 75* 188* --   A:  Progressive anemia. Thrombocytopenia.  Clinical syndrome with possibility of TTP, especially with schistocytes on smear and negative DIC panel. Treated stress dose steroids. HIT panel negative.  P:  Hematology following Empirically treatment for TTP with plasmapheresis and steroids. 5 rounds planned, day 5 today. Minimal response in platelets HIT-neg PRBC for Hgb < 7, Plt for < 10k  INFECTIOUS  Lab 08/14/11 0500 08/13/11 0430 08/12/11 0502 08/11/11 1740 08/11/11 0434 08/10/11 0310 08/08/11  0350 08/07/11 1155  WBC 7.5 7.4 9.2 11.6* 10.8* -- -- --  PROCALCITON -- 0.40 -- -- -- 0.93 0.66 0.66   Cultures: 7/9  Blood>>>ntd 7/9  Sputum>>>ntd  Antibiotics: Vanc 7/10>>>7/15 Zosyn 7/10 >>7/15  A:  No strong evidence of acute infection. P:   Discontinued empiric abx 7/15 as cultures negative  ENDOCRINE  Lab 08/08/11 1957  GLUCAP 152*   A:  Risk for steroid induced hyperglycemia-glucose not elevated on BMETs P:   No intervention required  NEUROLOGIC  Head CT: 7/9  >>> Small right subdural hematoma with 4 mm midline shift.  Cont EEG: >>> No seizures A:  Acute encephalopathy.  History of alcohol abuse.  History of meningioma resection.  History of seizure disorder on Dilantin.  History of medical noncompliance. Had witnessed seizure in ICU. ETOH abuse.  AMS related to TTP. P:   Neurosurgery following >> no role intervention at this time Dilantin, Keppra continued Thiamine, Folate. Continue Lexapro.  DISPO: Status:  ICU, will downgrade to SDU if not eligible to LTAC Primary:  PCCM Consulting: Neurosurgery Code:  Full GI Px: Not indicated DVT Px:  SCDs  Tana Conch, MD, PGY2 08/14/2011 8:03 AM  Patient examined.  Records reviewed.  Assessment and plan above is edited and discussed with ICU Resident Team.  Orlean Bradford, M.D., F.C.C.P. Pulmonary and Critical Care Medicine [facility] Cell: [phone] Pager: [phone]

## 2011-08-14 NOTE — Clinical Social Work Psychosocial (Signed)
     Clinical Social Work Department BRIEF PSYCHOSOCIAL ASSESSMENT 08/14/2011  Patient:  [name], [name]     Account Number:  [id number]     Admit date:  08/07/2011  Clinical Social Worker:  Margaree Mackintosh  Date/Time:  08/14/2011 11:00 AM  Referred by:  RN  Date Referred:  08/14/2011 Referred for  SNF Placement   Other Referral:   Interview type:  Patient Other interview type:   Family present.    PSYCHOSOCIAL DATA Living Status:  ALONE Admitted from facility:   Level of care:   Primary support name:  [name]: [phone] Primary support relationship to patient:  FAMILY Degree of support available:   Aunt  Adequate.    CURRENT CONCERNS Current Concerns  Post-Acute Placement   Other Concerns:    SOCIAL WORK ASSESSMENT / PLAN Clinical Social Worker recieved referral from RN indicating that pt may benefit from post acute rehab at dc.  CSW reviewed chart and met with aunt and cousin in pt's room; pt currently appears confused.  CSW introduced self, explained role, and provided support.  CSW reviewed dc planning; pt currently does not have anyone who can provide 24/7 care at time of dc.  CSW reviewed potential SNF option at time of dc pending PT/OT recommendations.  Aunt and cousin are in agreement with this plan.  CSW to begin paperwork for potential SNF search in [place].  CSW to update pt as his clarity improves.  CSW to continue to follow and assist as needed.   Assessment/plan status:  Psychosocial Support/Ongoing Assessment of Needs Other assessment/ plan:   Information/referral to community resources:   SNF  Financial Counseling-Medicaid.    PATIENTS/FAMILYS RESPONSE TO PLAN OF CARE: Pt currently appears confused.  Aunt and cousin were pleasant and engaged in conversation.  Aunt and Cousin thanked CSW for intervention.

## 2011-08-15 ENCOUNTER — Inpatient Hospital Stay
Admission: AD | Admit: 2011-08-15 | Discharge: 2011-09-19 | Disposition: A | Discharge status: Skilled Nursing Facility | Payer: Self-pay | Source: Ambulatory Visit | Attending: Internal Medicine | Admitting: Internal Medicine

## 2011-08-15 DIAGNOSIS — M311 Thrombotic microangiopathy: Secondary | ICD-10-CM

## 2011-08-15 LAB — RENAL FUNCTION PANEL
Albumin: 2.7 g/dL — ABNORMAL LOW (ref 3.5–5.2)
Calcium: 8.3 mg/dL — ABNORMAL LOW (ref 8.4–10.5)
GFR calc Af Amer: 37 mL/min — ABNORMAL LOW (ref >90)
Glucose, Bld: 94 mg/dL (ref 70–99)
Phosphorus: 2.1 mg/dL — ABNORMAL LOW (ref 2.3–4.6)
Potassium: 3.8 mEq/L (ref 3.5–5.1)
Sodium: 137 mEq/L (ref 135–145)

## 2011-08-15 LAB — CBC
MCH: 32 pg (ref 26.0–34.0)
MCHC: 32.9 g/dL (ref 30.0–36.0)
RDW: 20.2 % — ABNORMAL HIGH (ref 11.5–15.5)

## 2011-08-15 LAB — PHENYTOIN LEVEL, FREE AND TOTAL
Phenytoin Bound: 16.4 mg/L
Phenytoin, Free: 3.6 mg/L — ABNORMAL HIGH (ref 1.0–2.0)
Phenytoin, Total: 20 mg/L (ref 10.0–20.0)

## 2011-08-15 MED ORDER — SODIUM CHLORIDE 0.9 % IV SOLN: 500.0000 mg | Freq: Two times a day (BID) | INTRAVENOUS | Status: AC

## 2011-08-15 MED ORDER — SODIUM CHLORIDE 0.9 % IJ SOLN
INTRAMUSCULAR | Status: DC
Start: 2011-08-15 — End: 2011-08-15
  Filled 2011-08-15: qty 20

## 2011-08-15 MED ORDER — THIAMINE HCL 100 MG PO TABS: 100.0000 mg | ORAL_TABLET | Freq: Every day | ORAL | Status: AC

## 2011-08-15 MED ORDER — METHYLPREDNISOLONE SODIUM SUCC 125 MG IJ SOLR: 125.0000 mg | Freq: Two times a day (BID) | INTRAMUSCULAR | Status: AC

## 2011-08-15 MED ORDER — NEPRO/CARBSTEADY PO LIQD
237.0000 mL | Freq: Every day | ORAL | Status: DC | PRN
  Filled 2011-08-15: qty 237

## 2011-08-15 MED ORDER — METHYLPREDNISOLONE SODIUM SUCC 125 MG IJ SOLR: 80.0000 mg | INTRAMUSCULAR | Status: DC

## 2011-08-15 MED ORDER — LABETALOL HCL 5 MG/ML IV SOLN: 5.0000 mg | INTRAVENOUS | Status: AC | PRN

## 2011-08-15 MED ORDER — FOLIC ACID 1 MG PO TABS: 1.0000 mg | ORAL_TABLET | Freq: Every day | ORAL | Status: AC

## 2011-08-15 MED ORDER — LISINOPRIL 20 MG PO TABS: 20.0000 mg | ORAL_TABLET | Freq: Every day | ORAL | Status: AC

## 2011-08-15 MED ORDER — PHENYTOIN SODIUM 50 MG/ML IJ SOLN: 100.0000 mg | Freq: Three times a day (TID) | INTRAMUSCULAR | Status: AC

## 2011-08-15 MED ORDER — ESCITALOPRAM OXALATE 20 MG PO TABS: 20.0000 mg | ORAL_TABLET | Freq: Every day | ORAL | Status: AC

## 2011-08-15 MED ORDER — CARVEDILOL 25 MG PO TABS: 25.0000 mg | ORAL_TABLET | Freq: Two times a day (BID) | ORAL | Status: AC

## 2011-08-15 MED ORDER — AMLODIPINE BESYLATE 5 MG PO TABS: 5.0000 mg | ORAL_TABLET | Freq: Every day | ORAL | Status: AC

## 2011-08-15 MED ORDER — ESCITALOPRAM OXALATE 20 MG PO TABS: 20.0000 mg | ORAL_TABLET | Freq: Every day | ORAL | Status: DC

## 2011-08-15 NOTE — Discharge Summary (Signed)
Physician Discharge Summary  Patient ID: [name] MRN: [id number] DOB/AGE: [birth year] 56 y.o.  Admit date: 08/07/2011 Discharge date: 08/15/2011  Problem List Active Problems:  NEOP, BNG, CEREBRAL MENINGES  HYPERTENSION, BENIGN ESSENTIAL  HYPERTENSION, KIDNEY DISEASE, UNCONTROLLED  SEIZURE DISORDER  Encephalopathy acute  Hepatitis C without mention of hepatic coma  Diastolic heart failure  Subdural hematoma  Hypokalemia  Thrombocytopenia  Acidosis  Anemia  Malnutrition  Acute respiratory failure  Shock  Altered mental status  AKI (acute kidney injury)  TTP (thrombotic thrombocytopenic purpura)  HPI: 56 yo with a history of medication noncompliance for HTN and CKD Stage III brought to [facility] with altered mental status c/b seizure activity. Head CT demonstrated small right subdural hematoma with 4 mm midline shift (no intervention per neurosurgery). Noted to have a metabolic acidosis from acute on chronic renal failure. CCM evaluated for metabolic acidosis, renal failure, shock (?septic shock), and altered MS. Mental status has improved with CVVHD. Entire clinical presentation concerning for TTP so patient received 4 days of plasma exchange + steroids, but hem-onc thinks alternate etiology (ethanol abuse, meds) more likely.   Hospital Course:  Interval/subjective  7/9 Brought to [facility] with altered mental status. Head CT demonstrated small right subdural hematoma with 4 mm midline shift. Labs demonstarted multiple metabolic derangements.Transferred to [facility] for farther management.  7/10 Had a seizure ~530 am this am, treated w/ 1 mg ativan. 4098 PCCM team called to bedside for eval of Persistent hypotension not responding to volume challenge and decrease in MS. On bedside eval he is unresponsive. Will bite down when attempting to illicit gag, but has no other response to noxious stimuli. Currently SBP in 60s. Intubated. Central line placed. Transferred to ICU due to continued  hypotension of unknown origin.  7/11 Continuous EEG without seizure activity, asymetrical activity likely due to subdural, moderate encephalopathy-perhaps due to sedative medications. Only on intermittent sedation-infrequent use  7/12 Plasmapheresis started due to concern for TTP; pressors weaned off; received 4 treatments of plasma exchange  7/14 Started on cardene gtt due to elevated BP  7/15 extubated, cardene gtt stopped  7/16 passed swallow eval   Current status:  Some confusion  Got OOB with assist   Vital Signs: Reviewed  Temp: [97.7 F (36.5 C)-98.6 F (37 C)] 98.6 F (37 C) (07/17 0700)  Pulse Rate: [66-86] 66 (07/17 0800)  Resp: [13-21] 13 (07/17 0800)  BP: (121-204)/(83-114) 175/96 mmHg (07/17 0800)  SpO2: [98 %-100 %] 99 % (07/17 0800)  Intake/Output  07/16 0701 - 07/17 0700 07/17 0701 - 07/18 0700  P.O. 240  I.V. (mL/kg) 460 (5.7)  NG/GT  IV Piggyback 2  Total Intake(mL/kg) 702 (8.8)  Urine (mL/kg/hr) 50 (0)  Other 1404  Stool 250  Total Output 1704  Net -1002   Physical Examination:  General: NAD, resting in bed  Neuro: Follows commands, answers most questions appropriately  HEENT: PERRL  Neck: Supple, no JVD  Cardiovascular: RRR, no murmurs  Lungs: CTAB  Abdomen: Soft, nontender, bowel sounds present  Musculoskeletal: no edema  Skin: Intact   ASSESSMENT AND PLAN   PULMONARY   Lab  08/10/11 0417  08/09/11 0454   PHART  7.466*  7.536*   PCO2ART  30.5*  23.3*   PO2ART  105.0*  125.0*   HCO3  22.3  20.3   O2SAT  99.0  99.0    Ventilator Settings:   CXR: None today  ETT: 7/9 >>> 7/15  A: Acute respiratory failure secondary to AMS-now  resolved. S/p extubation  P:  Continue pulm hygiene   CARDIOVASCULAR   Lab  08/10/11 0400  08/08/11 2115  08/08/11 1545   TROPONINI  --  --  --   LATICACIDVEN  2.3*  2.3*  --   PROBNP  --  --  3123.0*    ECG: NA  TTE: EF 75%  Lines:  Right IJ CVL 7/9>>>  Left rad aline 7/9>>>7/15  R Femoral HD cath 7/10  >>  PA-cath 7/10 >> 7/12  PA-cath port 7/10 >> 7/16  A: History of diastolic CHF, no evidence of exacerbation. Shock, now resolved. Now hypertensive. Demand ischemia.  P:  Restarted home lisinopril, carvedilol, amlodipine 7/15  Labetalol prn for bp 160/100   RENAL   Lab  08/15/11 0529  08/14/11 0500  08/13/11 1758  08/13/11 0430  08/12/11 0502  08/11/11 0434  08/10/11 0310   NA  137  141  145  139  142  --  --   K  3.8  4.5  --  --  --  --  --   CL  101  104  104  102  105  --  --   CO2  28  29  32  29  27  --  --   BUN  17  14  17  14  11   --  --   CREATININE  2.22*  1.25  1.43*  1.29  1.34  --  --   CALCIUM  8.3*  8.2*  8.4  7.8*  7.3*  --  --   MG  --  2.2  --  2.1  2.0  2.0  1.9   PHOS  2.1*  2.2*  2.0*  1.8*  1.7*  --  --    Intake/Output  07/16 0701 - 07/17 0700 07/17 0701 - 07/18 0700  P.O. 240  I.V. (mL/kg) 460 (5.7)  NG/GT  IV Piggyback 2  Total Intake(mL/kg) 702 (8.8)  Urine (mL/kg/hr) 50 (0)  Other 1404  Stool 250  Total Output 1704  Net -1002   Renal US: 7/9 Chronic medical renal disease.  Foley: 7/9 >>>  Femoral HD catheter 7/10 >>  A: Acute on chronic renal failure in setting of dehydration and suspected TTP. CKD and renal artery stenosis. Hypokalemia, resolved. Hypophosphatemia.  P:  CVVH--> transitioning to standard HD 7/17, may require lifelong. If so then he will need perm-cath, currently has a femoral temp-cath. This will need to be done by vascular sgy or IR once his platelets have recovered Renal following  Trend BMP   GASTROINTESTINAL   Lab  08/15/11 0529  08/14/11 0500  08/13/11 1758  08/13/11 0430  08/12/11 0502  08/11/11 0434  08/10/11 0310  08/09/11 0310   AST  --  --  --  26  33  28  30  32   ALT  --  --  --  18  22  21  27  28    ALKPHOS  --  --  --  56  54  61  93  86   BILITOT  --  --  --  0.6  0.9  0.4  0.4  0.6   PROT  --  --  --  5.8*  5.2*  5.0*  4.9*  4.6*   ALBUMIN  2.7*  3.0*  3.0*  2.9*  2.5*  --  --  --    A: History of  hepatitis C. Suspected malnutrition. Cirrhosis. ETOH abuse.  P:  OK to start regular diet per swallow eval 7/16   HEMATOLOGIC   Lab  08/15/11 0500  08/14/11 0500  08/13/11 0430  08/12/11 0502  08/11/11 1740  08/11/11 0513  08/11/11 0434  08/10/11 0310  08/09/11 1102  08/09/11 0856   HGB  7.2*  7.2*  7.2*  7.6*  7.5*  --  --  --  --  --   HCT  21.9*  22.3*  21.9*  22.2*  21.7*  --  --  --  --  --   PLT  21*  15*  12*  10*  9*  --  --  --  --  --   INR  --  --  1.13  --  --  1.08  1.07  --  1.17  1.30   APTT  --  --  30  29  --  --  32  75*  188*  --    A: Progressive anemia. Thrombocytopenia. Clinical syndrome with possibility of TTP, especially with schistocytes on smear and negative DIC panel. Treated w plasma exchange and steroids. HIT panel negative. Heme not sure this was TTP.  P:  Hematology following, unclear if this was TTP  Empirically treatment for TTP with plasma exchange, completed. Continue solumedrol for 7 days total (2 more days) and then stop. Platelets slowly rising  HITT panel >> negative PRBC for Hgb < 7, Plt for < 10k   INFECTIOUS   Lab  08/15/11 0500  08/14/11 0500  08/13/11 0430  08/12/11 0502  08/11/11 1740  08/10/11 0310   WBC  9.4  7.5  7.4  9.2  11.6*  --   PROCALCITON  --  --  0.40  --  --  0.93    Cultures:  7/9 Blood>>>ntd  7/9 Sputum>>>ntd  Antibiotics:  Vanc 7/10>>>7/15  Zosyn 7/10 >>7/15  A: No strong evidence of acute infection.  P:  Discontinued empiric abx 7/15 as cultures negative  ENDOCRINE   Lab  08/08/11 1957   GLUCAP  152*    A: Risk for steroid induced hyperglycemia-glucose not elevated on BMETs  P:  No intervention required   NEUROLOGIC  Head CT: 7/9 >>> Small right subdural hematoma with 4 mm midline shift.  Cont EEG: >>> No seizures  A: Acute encephalopathy. History of alcohol abuse. History of meningioma resection. History of seizure disorder on Dilantin. History of medical noncompliance. Had witnessed seizure in ICU. ETOH  abuse. AMS ? related to TTP vs metabolic encephalopathy.  P:  Neurosurgery following >> no role intervention at this time  Dilantin, Keppra continued >> ? Convert to PO in next several datys Thiamine, Folate.  Continue Lexapro.  DISPO:  Status: SDU to [facility] Primary: PCCM  Consulting: Neurosurgery, Renal, Heme  Code: Full  GI Px: Not indicated  DVT Px: SCDs       Labs at discharge Lab Results  Component Value Date   CREATININE 2.22* 08/15/2011   BUN 17 08/15/2011   NA 137 08/15/2011   K 3.8 08/15/2011   CL 101 08/15/2011   CO2 28 08/15/2011   Lab Results  Component Value Date   WBC 9.4 08/15/2011   HGB 7.2* 08/15/2011   HCT 21.9* 08/15/2011   MCV 97.3 08/15/2011   PLT 21* 08/15/2011   Lab Results  Component Value Date   ALT 18 08/13/2011   AST 26 08/13/2011   ALKPHOS 56 08/13/2011   BILITOT 0.6 08/13/2011   Lab Results  Component  Value Date   INR 1.13 08/13/2011   INR 1.08 08/11/2011   INR 1.07 08/11/2011    Current radiology studies No results found.  Disposition:    Discharge Orders    Future Orders Please Complete By Expires   Discharge to SNF when bed available        Medication List  As of 08/15/2011 10:30 AM   TAKE these medications         amLODipine 5 MG tablet   Commonly known as: NORVASC   Take 1 tablet (5 mg total) by mouth daily.      carvedilol 25 MG tablet   Commonly known as: COREG   Take 1 tablet (25 mg total) by mouth 2 (two) times daily with a meal.      escitalopram 20 MG tablet   Commonly known as: LEXAPRO   Take 1 tablet (20 mg total) by mouth daily.      folic acid 1 MG tablet   Commonly known as: FOLVITE   Take 1 tablet (1 mg total) by mouth daily.      labetalol 5 MG/ML injection   Commonly known as: NORMODYNE,TRANDATE   Inject 1 mL (5 mg total) into the vein every 2 (two) hours as needed (>160/100).      lisinopril 20 MG tablet   Commonly known as: PRINIVIL,ZESTRIL   Take 1 tablet (20 mg total) by mouth daily.       methylPREDNISolone sodium succinate 125 mg/2 mL injection   Commonly known as: SOLU-MEDROL   Inject 2 mLs (125 mg total) into the vein every 12 (twelve) hours.      phenytoin 50 MG/ML injection   Commonly known as: DILANTIN   Inject 2 mLs (100 mg total) into the vein every 8 (eight) hours.      sodium chloride 0.9 % SOLN 100 mL with levETIRAcetam 500 MG/5ML SOLN 500 mg   Inject 500 mg into the vein every 12 (twelve) hours.      thiamine 100 MG tablet   Take 1 tablet (100 mg total) by mouth daily.           Follow-up Information    Please follow up. (per [facility].)          Discharged Condition: fair Vital signs at Discharge. Temp:  [97.7 F (36.5 C)-98.6 F (37 C)] 98.6 F (37 C) (07/17 0700) Pulse Rate:  [66-86] 66  (07/17 0800) Resp:  [13-21] 13  (07/17 0800) BP: (121-204)/(83-111) 175/96 mmHg (07/17 0800) SpO2:  [98 %-100 %] 99 % (07/17 0800) Office follow up Special Information or instructions. Care per [facility]. Signed: Brett Canales Minor ACNP Adolph Pollack PCCM Pager [phone] till 3 pm If no answer page [phone] 08/15/2011, 10:30 AM   Attending Addendum:  I have seen the patient, discussed the issues, test results and plans with S. Minor, NP. I agree with the Assessment and Plans as ammended above.   Levy Pupa, MD, PhD 08/15/2011, 11:16 AM [facility] [phone] or if no answer [phone]

## 2011-08-15 NOTE — Progress Notes (Signed)
Speech Language Pathology Dysphagia Treatment Patient Details Name: [name] MRN: [id number] DOB: 1955-05-31 Today's Date: 08/15/2011 Time: 4098-1191 SLP Time Calculation (min): 9 min  Assessment / Plan / Recommendation Clinical Impression  Patient continues to presents with what appears to be a functional oropharyngeal swallow without overt s/s of aspiration at bedside. Patient continues to be impulsive, requiring both verbal and tactile cues to decrease sip size as a general safe swallowing precaution. Overall, patient appears to be tolerating current diet. Would benefit from brief f/u to continue to ensure diet tolerance as well as cognitive evaluation. Noted plans to discharge to [facility] this pm. Will defer further treatment to [facility].     Diet Recommendation  Continue with Current Diet: Regular;Thin liquid    SLP Plan Discharge SLP treatment due to (comment) (getting ready to move to [facility] (select))   Pertinent Vitals/Pain n/a   Swallowing Goals  SLP Swallowing Goals Patient will consume recommended diet without observed clinical signs of aspiration with: Supervision/safety Swallow Study Goal #1 - Progress: Discontinued (comment) (due to d/c to [facility]) Patient will utilize recommended strategies during swallow to increase swallowing safety with: Supervision/safety Swallow Study Goal #2 - Progress: Discontinued (comment) (due to d/c to LTACH)  General Temperature Spikes Noted: No Respiratory Status: Room air Behavior/Cognition: Alert;Cooperative;Pleasant mood;Impulsive;Distractible;Requires cueing;Decreased sustained attention Oral Cavity - Dentition: Adequate natural dentition Patient Positioning: Upright in bed   Dysphagia Treatment Treatment focused on: Skilled observation of diet tolerance Treatment Methods/Modalities: Skilled observation Patient observed directly with PO's: Yes Type of PO's observed: Regular;Thin liquids Feeding: Needs assist (due to mittens in  place) Liquids provided via: Straw Type of cueing: Verbal;Tactile Amount of cueing: Minimal   GO   Ferdinand Lango MA, CCC-SLP [phone]   Alyzae Hawkey Meryl 08/15/2011, 11:38 AM

## 2011-08-15 NOTE — Progress Notes (Signed)
PT Cancellation Note  Treatment cancelled today due to medical issues with patient which prohibited therapy, pt still has temporary femoral HD catheter and will be going to HD then ? LTACH 08/15/2011  Pomona Bing, PT [phone] (pager)

## 2011-08-15 NOTE — Progress Notes (Signed)
OT Cancellation Note  Treatment cancelled today due to medical issues with patient which prohibited therapy (HD  Femoral).  Harrel Carina [place] Pager: [phone]  08/15/2011, 1:30 PM

## 2011-08-15 NOTE — Progress Notes (Signed)
Clinical Social Work  Per chart review, patient is dc to [facility]. CSW is signing off but available if needed.  [place], [place] [phone]

## 2011-08-15 NOTE — Progress Notes (Signed)
Name: [name] MRN: [id number] DOB: February 28, 1955    LOS: 8  Referring Provider:  Transferred from [facility] Reason for Referral:  Altered mental status  PULMONARY / CRITICAL CARE MEDICINE   Brief patient description: 56 yo with a history of medication noncompliance for HTN and CKD Stage III brought to [facility] with altered mental status with a Head CT demonstrated small right subdural hematoma with 4 mm midline shift (no intervention per neurosurgery) noted to have a metabolic acidosis from acute on chronic renal failure whose mental status has improved with CVVHD. Also concern for TTP so patient received 4 days of plasma exchange, but hem-onc thinks alternate etiology (ethanol abuse, meds) more likely.   Interval/subjective 7/9    Brought to [facility] with altered mental status.  Head CT demonstrated small right subdural hematoma with 4 mm midline shift.  Labs demonstarted multiple metabolic derangements.Transferred to [facility] for farther management. 7/10  Had a seizure ~530 am this am, treated w/ 1 mg ativan. [phone] PCCM team called to bedside for eval of Persistent hypotension not responding to volume challenge and decrease in MS. On bedside eval he is unresponsive. Will bite down when attempting to illicit gag, but has no other response to noxious stimuli. Currently SBP in 60s. Intubated. Central line placed. Transferred to ICU due to continued hypotension of unknown origin. 7/11  Continuous EEG without seizure activity, asymetrical activity likely due to subdural, moderate encephalopathy-perhaps due to sedative medications. Only on intermittent sedation-infrequent use 7/12  Plasmapheresis started due to concern for TTP; pressors weaned off; received 5 treatments of plasma exchange 7/14  Started on cardene gtt due to elevated BP 7/15 extubated, cardene gtt stopped 7/16 passed swallow eval  Current status:  Some confusion Got OOB with assist  Vital Signs:  Reviewed  Temp:  [97.7 F (36.5 C)-98.6 F  (37 C)] 98.6 F (37 C) (07/17 0700) Pulse Rate:  [66-86] 66  (07/17 0800) Resp:  [13-21] 13  (07/17 0800) BP: (121-204)/(83-114) 175/96 mmHg (07/17 0800) SpO2:  [98 %-100 %] 99 % (07/17 0800) Intake/Output      07/16 0701 - 07/17 0700 07/17 0701 - 07/18 0700   P.O. 240    I.V. (mL/kg) 460 (5.7)    NG/GT     IV Piggyback 2    Total Intake(mL/kg) 702 (8.8)    Urine (mL/kg/hr) 50 (0)    Other 1404    Stool 250    Total Output 1704    Net -1002          Physical Examination: General: NAD, resting in bed Neuro:  Follows commands, answers most questions appropriately HEENT:  PERRL Neck:  Supple, no JVD Cardiovascular:  RRR, no murmurs Lungs:  CTAB Abdomen:  Soft, nontender, bowel sounds present Musculoskeletal:  no edema Skin:  Intact  ASSESSMENT AND PLAN  PULMONARY  Lab 08/10/11 0417 08/09/11 0454  PHART 7.466* 7.536*  PCO2ART 30.5* 23.3*  PO2ART 105.0* 125.0*  HCO3 22.3 20.3  O2SAT 99.0 99.0   Ventilator Settings:    CXR: None today ETT: 7/9 >>> 7/15  A:  Acute respiratory failure secondary to AMS-now resolved. S/p extubation P:   Continue pulm hygiene   CARDIOVASCULAR  Lab 08/10/11 0400 08/08/11 2115 08/08/11 1545  TROPONINI -- -- --  LATICACIDVEN 2.3* 2.3* --  PROBNP -- -- 3123.0*   ECG:  NA TTE:  EF 75% Lines: Right IJ CVL 7/9>>> Left rad aline 7/9>>>7/15 R Femoral HD cath 7/10 >>  PA-cath 7/10 >> 7/12 PA-cath  port 7/10 >> 7/16  A: History of diastolic CHF, no evidence of exacerbation. Shock, now resolved. Now hypertensive. Demand ischemia. P: Restarted home lisinopril, carvedilol, amlodipine 7/15 Labetalol prn for bp 160/100  RENAL  Lab 08/15/11 0529 08/14/11 0500 08/13/11 1758 08/13/11 0430 08/12/11 0502 08/11/11 0434 08/10/11 0310  NA 137 141 145 139 142 -- --  K 3.8 4.5 -- -- -- -- --  CL 101 104 104 102 105 -- --  CO2 28 29 32 29 27 -- --  BUN 17 14 17 14 11  -- --  CREATININE 2.22* 1.25 1.43* 1.29 1.34 -- --  CALCIUM 8.3* 8.2* 8.4  7.8* 7.3* -- --  MG -- 2.2 -- 2.1 2.0 2.0 1.9  PHOS 2.1* 2.2* 2.0* 1.8* 1.7* -- --   Intake/Output      07/16 0701 - 07/17 0700 07/17 0701 - 07/18 0700   P.O. 240    I.V. (mL/kg) 460 (5.7)    NG/GT     IV Piggyback 2    Total Intake(mL/kg) 702 (8.8)    Urine (mL/kg/hr) 50 (0)    Other 1404    Stool 250    Total Output 1704    Net -1002          Renal US:  7/9 Chronic medical renal disease.  Foley:  7/9 >>> Femoral HD catheter 7/10 >>   A:  Acute on chronic renal failure in setting of dehydration and suspected TTP. CKD and renal artery stenosis. Hypokalemia, resolved.  Hypophosphatemia. P: CVVH--> transitioning to standard HD 7/17, may require lifelong. If so then he will need perm-cath, currently has a femoral temp-cath Renal following Trend BMP  GASTROINTESTINAL  Lab 08/15/11 0529 08/14/11 0500 08/13/11 1758 08/13/11 0430 08/12/11 0502 08/11/11 0434 08/10/11 0310 08/09/11 0310  AST -- -- -- 26 33 28 30 32  ALT -- -- -- 18 22 21 27 28   ALKPHOS -- -- -- 56 54 61 93 86  BILITOT -- -- -- 0.6 0.9 0.4 0.4 0.6  PROT -- -- -- 5.8* 5.2* 5.0* 4.9* 4.6*  ALBUMIN 2.7* 3.0* 3.0* 2.9* 2.5* -- -- --   A:  History of hepatitis C.  Suspected malnutrition. Cirrhosis. ETOH abuse.  P:   OK to start regular diet per swallow eval 7/16  HEMATOLOGIC  Lab 08/15/11 0500 08/14/11 0500 08/13/11 0430 08/12/11 0502 08/11/11 1740 08/11/11 0513 08/11/11 0434 08/10/11 0310 08/09/11 1102 08/09/11 0856  HGB 7.2* 7.2* 7.2* 7.6* 7.5* -- -- -- -- --  HCT 21.9* 22.3* 21.9* 22.2* 21.7* -- -- -- -- --  PLT 21* 15* 12* 10* 9* -- -- -- -- --  INR -- -- 1.13 -- -- 1.08 1.07 -- 1.17 1.30  APTT -- -- 30 29 -- -- 32 75* 188* --   A:  Progressive anemia. Thrombocytopenia.  Clinical syndrome with possibility of TTP, especially with schistocytes on smear and negative DIC panel. Treated w plasma exchange and steroids. HIT panel negative. Heme not sure this was TTP.  P:  Hematology following, unclear if this was  TTP Empirically treatment for TTP with plasma exchange, completed. Continue solumedrol for 7 days total (2 more days) and then stop. Platelets slowly rising HITT panel >> neg PRBC for Hgb < 7, Plt for < 10k  INFECTIOUS  Lab 08/15/11 0500 08/14/11 0500 08/13/11 0430 08/12/11 0502 08/11/11 1740 08/10/11 0310  WBC 9.4 7.5 7.4 9.2 11.6* --  PROCALCITON -- -- 0.40 -- -- 0.93   Cultures: 7/9  Blood>>>ntd  7/9  Sputum>>>ntd  Antibiotics: Vanc 7/10>>>7/15 Zosyn 7/10 >>7/15  A:  No strong evidence of acute infection. P:   Discontinued empiric abx 7/15 as cultures negative  ENDOCRINE  Lab 08/08/11 1957  GLUCAP 152*   A:  Risk for steroid induced hyperglycemia-glucose not elevated on BMETs P:   No intervention required  NEUROLOGIC  Head CT: 7/9  >>> Small right subdural hematoma with 4 mm midline shift.  Cont EEG: >>> No seizures A:  Acute encephalopathy.  History of alcohol abuse.  History of meningioma resection.  History of seizure disorder on Dilantin.  History of medical noncompliance. Had witnessed seizure in ICU. ETOH abuse.  AMS related to TTP. P:   Neurosurgery following >> no role intervention at this time Dilantin, Keppra continued >> ? Convert to PO Thiamine, Folate. Continue Lexapro.  DISPO: Status:  ICU, will downgrade to SDU if not eligible to LTAC Primary:  PCCM Consulting: Neurosurgery, Renal, Heme Code:  Full GI Px: Not indicated DVT Px:  SCDs  Levy Pupa, MD, PhD 08/15/2011, 10:09 AM [facility] [phone] or if no answer [phone]

## 2011-08-15 NOTE — Progress Notes (Signed)
Nutrition Follow-up  Intervention:    Add Nepro supplement daily PRN (425 kcals, 19.1 gm protein per 8 fl oz bottle) RD to follow for nutrition care plan  Assessment:   Patient unresponsive to RD questions. Patient extubated 7/15. Beside swallow evaluation 7/16. CVVHD discontinued, receiving hemodialysis. No % PO intake per flowsheet records. Plan for discharge to [facility]  Diet Order:  Renal 60/70-03-02-1198 ml  Meds: Scheduled Meds:   . amLODipine  5 mg Oral Daily  . antiseptic oral rinse  15 mL Mouth Rinse QID  . carvedilol  25 mg Oral BID WC  . escitalopram  20 mg Oral Daily  . folic acid  1 mg Oral Daily  . heparin      . heparin      . levetiracetam  500 mg Intravenous Q12H  . lisinopril  20 mg Oral Daily  . methylPREDNISolone (SOLU-MEDROL) injection  80 mg Intravenous Q24H  . phenytoin (DILANTIN) IV  100 mg Intravenous Q8H  . sodium chloride      . thiamine  100 mg Oral Daily  . DISCONTD: anticoagulant sodium citrate  5 mL Intracatheter Once  . DISCONTD: calcium gluconate  2 g Intravenous Once  . DISCONTD: methylPREDNISolone (SOLU-MEDROL) injection  125 mg Intravenous Q12H   Continuous Infusions:   . DISCONTD: citrate dextrose 500 mL (08/12/11 2329)  . DISCONTD: citrate dextrose    . DISCONTD: dialysis replacement fluid (prismasate) 700 mL/hr at 08/14/11 0447  . DISCONTD: dialysis replacement fluid (prismasate) 300 mL/hr at 08/14/11 0509  . DISCONTD: dialysate (PRISMASATE) 2,000 mL/hr at 08/14/11 1238   PRN Meds:.labetalol, sodium chloride, DISCONTD: sodium chloride, DISCONTD: acetaminophen, DISCONTD: calcium carbonate, DISCONTD: calcium gluconate IVPB, DISCONTD: calcium gluconate IVPB, DISCONTD: calcium gluconate, DISCONTD: diphenhydrAMINE  Labs:  CMP     Component Value Date/Time   NA 137 08/15/2011 0529   K 3.8 08/15/2011 0529   CL 101 08/15/2011 0529   CO2 28 08/15/2011 0529   GLUCOSE 94 08/15/2011 0529   BUN 17 08/15/2011 0529   CREATININE 2.22* 08/15/2011 0529   CALCIUM 8.3* 08/15/2011 0529   PROT 5.8* 08/13/2011 0430   ALBUMIN 2.7* 08/15/2011 0529   AST 26 08/13/2011 0430   ALT 18 08/13/2011 0430   ALKPHOS 56 08/13/2011 0430   BILITOT 0.6 08/13/2011 0430   GFRNONAA 32* 08/15/2011 0529   GFRAA 37* 08/15/2011 0529     Intake/Output Summary (Last 24 hours) at 08/15/11 1231 Last data filed at 08/15/11 0400  Gross per 24 hour  Intake    502 ml  Output    630 ml  Net   -128 ml    Weight Status:  80.2 kg (7/16) -- fluctuating   Re-estimated needs:  2000-2200 kcals, 85-100 gm protein  Nutrition Dx:  Inadequate Oral intake now r/t limited appetite as evidenced by no % PO intake recorded, ongoing  New Goal:  Oral intake with meals & supplements to meet >/= 90% of estimated nutrition needs, unmet  Monitor:  PO intake, supplement acceptance, weight, labs, I/O's  Alger Memos, RD, LDN Pager #: [phone] After-Hours Pager #: [phone]

## 2011-08-15 NOTE — Progress Notes (Signed)
Patient ID: IMIR BRUMBACH, male   DOB: 1955-05-21, 56 y.o.   MRN: [id number] S:no new complaints O:BP 175/96  Pulse 66  Temp 98.6 F (37 C) (Oral)  Resp 13  Ht 5\' 11"  (1.803 m)  Wt 80.2 kg (176 lb 12.9 oz)  BMI 24.66 kg/m2  SpO2 99%  Intake/Output Summary (Last 24 hours) at 08/15/11 1015 Last data filed at 08/15/11 0400  Gross per 24 hour  Intake    582 ml  Output   1113 ml  Net   -531 ml   Intake/Output: I/O last 3 completed shifts: In: 1397 [P.O.:240; I.V.:930; NG/GT:120; IV Piggyback:107] Out: 4405 [Urine:50; Other:3255; Stool:1100]  Intake/Output this shift:    Weight change:  Gen:WD WN AAM in NAD CVS:RRR Resp:CTA ION:GEXBMW Ext:3+edema   Lab 08/15/11 0529 08/14/11 0500 08/13/11 1758 08/13/11 0430 08/12/11 0502 08/11/11 2021 08/11/11 1704 08/11/11 0434 08/10/11 0310 08/09/11 0310  NA 137 141 145 139 142 -- 142 142 -- --  K 3.8 4.5 3.2* 3.3* 3.6 -- 3.1* 3.2* -- --  CL 101 104 104 102 105 -- 104 108 -- --  CO2 28 29 32 29 27 -- 28 28 -- --  GLUCOSE 94 92 104* 127* 134* -- 121* 129* -- --  BUN 17 14 17 14 11  -- 14 12 -- --  CREATININE 2.22* 1.25 1.43* 1.29 1.34 -- 1.60* 1.47* -- --  ALBUMIN 2.7* 3.0* 3.0* 2.9* 2.5* 2.8* 2.8* -- -- --  CALCIUM 8.3* 8.2* 8.4 7.8* 7.3* 7.3* 7.4* -- -- --  PHOS 2.1* 2.2* 2.0* 1.8* 1.7* -- 2.5 1.9* -- --  AST -- -- -- 26 33 -- -- 28 30 32  ALT -- -- -- 18 22 -- -- 21 27 28    Liver Function Tests:  Lab 08/15/11 0529 08/14/11 0500 08/13/11 1758 08/13/11 0430 08/12/11 0502 08/11/11 0434  AST -- -- -- 26 33 28  ALT -- -- -- 18 22 21   ALKPHOS -- -- -- 56 54 61  BILITOT -- -- -- 0.6 0.9 0.4  PROT -- -- -- 5.8* 5.2* 5.0*  ALBUMIN 2.7* 3.0* 3.0* -- -- --   No results found for this basename: LIPASE:3,AMYLASE:3 in the last 168 hours No results found for this basename: AMMONIA:3 in the last 168 hours CBC:  Lab 08/15/11 0500 08/14/11 0500 08/13/11 0430 08/12/11 0502 08/11/11 1740 08/11/11 0434  WBC 9.4 7.5 7.4 -- -- --  NEUTROABS --  -- 6.3 8.0* -- 9.0*  HGB 7.2* 7.2* 7.2* -- -- --  HCT 21.9* 22.3* 21.9* -- -- --  MCV 97.3 97.8 94.8 92.1 91.6 --  PLT 21* 15* 12* -- -- --   Cardiac Enzymes:  Lab 08/08/11 2115  CKTOTAL 502*  CKMB --  CKMBINDEX --  TROPONINI --   CBG:  Lab 08/08/11 1957  GLUCAP 152*    Iron Studies: No results found for this basename: IRON,TIBC,TRANSFERRIN,FERRITIN in the last 72 hours Studies/Results: No results found.    Marland Kitchen amLODipine  5 mg Oral Daily  . antiseptic oral rinse  15 mL Mouth Rinse QID  . carvedilol  25 mg Oral BID WC  . escitalopram  20 mg Oral Daily  . folic acid  1 mg Oral Daily  . heparin      . heparin      . levetiracetam  500 mg Intravenous Q12H  . lisinopril  20 mg Oral Daily  . methylPREDNISolone (SOLU-MEDROL) injection  80 mg Intravenous Q24H  . phenytoin (DILANTIN) IV  100 mg Intravenous Q8H  . phosphorus  250 mg Oral Once  . sodium chloride      . thiamine  100 mg Oral Daily  . DISCONTD: anticoagulant sodium citrate  5 mL Intracatheter Once  . DISCONTD: calcium gluconate  2 g Intravenous Once  . DISCONTD: methylPREDNISolone (SOLU-MEDROL) injection  125 mg Intravenous Q12H    BMET    Component Value Date/Time   NA 137 08/15/2011 0529   K 3.8 08/15/2011 0529   CL 101 08/15/2011 0529   CO2 28 08/15/2011 0529   GLUCOSE 94 08/15/2011 0529   BUN 17 08/15/2011 0529   CREATININE 2.22* 08/15/2011 0529   CALCIUM 8.3* 08/15/2011 0529   GFRNONAA 32* 08/15/2011 0529   GFRAA 37* 08/15/2011 0529   CBC    Component Value Date/Time   WBC 9.4 08/15/2011 0500   RBC 2.25* 08/15/2011 0500   HGB 7.2* 08/15/2011 0500   HCT 21.9* 08/15/2011 0500   PLT 21* 08/15/2011 0500   MCV 97.3 08/15/2011 0500   MCH 32.0 08/15/2011 0500   MCHC 32.9 08/15/2011 0500   RDW 20.2* 08/15/2011 0500   LYMPHSABS 0.4* 08/13/2011 0430   MONOABS 0.7 08/13/2011 0430   EOSABS 0.0 08/13/2011 0430   BASOSABS 0.0 08/13/2011 0430   Assessment/Plan:  1. AKI/CKD- had advanced CKD at baseline (stage3-4) now  likely ischemic ATN due to SIRS vs TTP. Continue with HD for now. Have made transition to IHD and remains oliguric/anuric.  Discussed with pt and his "mother" that there is a chance that HD will be a longterm consequence. Will check bladder scan and follow UOP/Scr between HD 2. Thrombocytopenia- s/p plasma exchange daily for 4 days and now plt up to 21.  Cont with prednisone taper. Low suspicion for TTP by Hematology but appears to have had some response. Off daily pheresis and follow. HIT panel is negative. Likely due to Etoh/Hep/cirrhosis 3. Anemia of chronic disease vs ABLA- on epo and transfuse for Hgb <7.  Will add IV Feraheme 4. HTN- increase UF, eval swallowing and start po meds 5. DM- per primary svc 6. F/E/N- d/c oral Kcl And follow 7. Hypophosphatemia- started on oral neutra phos and should improve with improved po intake. 8. Volume excess- cont with UF and consider starting lasix IV if UOP improves. 9. SIRS- unclear etiology. All cx's negative 10. AMS/SDH- improved 11. Dispo- possible transfer to [facility].  Pt will need more permanent access such as tunneled PC and eventually AVF or AVG once plt count improves.  Harm Jou A

## 2011-08-16 LAB — CBC
HCT: 21.5 % — ABNORMAL LOW (ref 39.0–52.0)
Hemoglobin: 7.1 g/dL — ABNORMAL LOW (ref 13.0–17.0)
MCH: 31.7 pg (ref 26.0–34.0)
MCV: 96 fL (ref 78.0–100.0)
RBC: 2.24 MIL/uL — ABNORMAL LOW (ref 4.22–5.81)
WBC: 9.4 10*3/uL (ref 4.0–10.5)

## 2011-08-16 LAB — PREPARE RBC (CROSSMATCH)

## 2011-08-16 LAB — COMPREHENSIVE METABOLIC PANEL
AST: 52 U/L — ABNORMAL HIGH (ref 0–37)
BUN: 22 mg/dL (ref 6–23)
CO2: 28 mEq/L (ref 19–32)
Calcium: 8.2 mg/dL — ABNORMAL LOW (ref 8.4–10.5)
Chloride: 101 mEq/L (ref 96–112)
Creatinine, Ser: 3.46 mg/dL — ABNORMAL HIGH (ref 0.50–1.35)
GFR calc Af Amer: 21 mL/min — ABNORMAL LOW (ref >90)
GFR calc non Af Amer: 18 mL/min — ABNORMAL LOW (ref >90)
Glucose, Bld: 80 mg/dL (ref 70–99)
Total Bilirubin: 0.4 mg/dL (ref 0.3–1.2)

## 2011-08-16 LAB — PROTIME-INR
INR: 1.1 (ref 0.00–1.49)
Prothrombin Time: 14.4 seconds (ref 11.6–15.2)

## 2011-08-17 LAB — TYPE AND SCREEN
ABO/RH(D): A POS
Antibody Screen: NEGATIVE
Unit division: 0

## 2011-08-18 LAB — CBC WITH DIFFERENTIAL/PLATELET
Basophils Absolute: 0 10*3/uL (ref 0.0–0.1)
Basophils Relative: 0 % (ref 0–1)
Eosinophils Relative: 0 % (ref 0–5)
HCT: 25.1 % — ABNORMAL LOW (ref 39.0–52.0)
MCHC: 34.3 g/dL (ref 30.0–36.0)
MCV: 90.9 fL (ref 78.0–100.0)
Monocytes Absolute: 0.4 10*3/uL (ref 0.1–1.0)
Neutro Abs: 7.9 10*3/uL — ABNORMAL HIGH (ref 1.7–7.7)
RDW: 18.7 % — ABNORMAL HIGH (ref 11.5–15.5)

## 2011-08-18 LAB — RENAL FUNCTION PANEL
Albumin: 2.1 g/dL — ABNORMAL LOW (ref 3.5–5.2)
Calcium: 7.3 mg/dL — ABNORMAL LOW (ref 8.4–10.5)
Creatinine, Ser: 3.83 mg/dL — ABNORMAL HIGH (ref 0.50–1.35)
GFR calc non Af Amer: 16 mL/min — ABNORMAL LOW (ref >90)
Phosphorus: 2.9 mg/dL (ref 2.3–4.6)

## 2011-08-19 ENCOUNTER — Other Ambulatory Visit (HOSPITAL_COMMUNITY): Payer: Self-pay

## 2011-08-19 LAB — CBC WITH DIFFERENTIAL/PLATELET
Basophils Absolute: 0 10*3/uL (ref 0.0–0.1)
Basophils Relative: 0 % (ref 0–1)
HCT: 25.9 % — ABNORMAL LOW (ref 39.0–52.0)
MCHC: 33.6 g/dL (ref 30.0–36.0)
Monocytes Absolute: 0.2 10*3/uL (ref 0.1–1.0)
Neutro Abs: 5.6 10*3/uL (ref 1.7–7.7)
Neutrophils Relative %: 91 % — ABNORMAL HIGH (ref 43–77)
RDW: 18.6 % — ABNORMAL HIGH (ref 11.5–15.5)

## 2011-08-19 LAB — PHENYTOIN LEVEL, FREE AND TOTAL: Phenytoin, Total: 11.8 mg/L (ref 10.0–20.0)

## 2011-08-19 LAB — BASIC METABOLIC PANEL
BUN: 38 mg/dL — ABNORMAL HIGH (ref 6–23)
Creatinine, Ser: 4.52 mg/dL — ABNORMAL HIGH (ref 0.50–1.35)
GFR calc Af Amer: 15 mL/min — ABNORMAL LOW (ref >90)
GFR calc non Af Amer: 13 mL/min — ABNORMAL LOW (ref >90)

## 2011-08-20 ENCOUNTER — Other Ambulatory Visit (HOSPITAL_COMMUNITY): Payer: Self-pay

## 2011-08-20 ENCOUNTER — Encounter: Payer: Self-pay | Admitting: Radiology

## 2011-08-20 LAB — CBC
Hemoglobin: 9.5 g/dL — ABNORMAL LOW (ref 13.0–17.0)
MCH: 31.9 pg (ref 26.0–34.0)
MCV: 92.6 fL (ref 78.0–100.0)
Platelets: 53 10*3/uL — ABNORMAL LOW (ref 150–400)
RBC: 2.98 MIL/uL — ABNORMAL LOW (ref 4.22–5.81)
WBC: 7.3 10*3/uL (ref 4.0–10.5)

## 2011-08-20 LAB — IRON AND TIBC
Iron: 76 ug/dL (ref 42–135)
TIBC: 164 ug/dL — ABNORMAL LOW (ref 215–435)

## 2011-08-20 LAB — RETICULOCYTES
RBC.: 2.98 MIL/uL — ABNORMAL LOW (ref 4.22–5.81)
Retic Count, Absolute: 86.4 10*3/uL (ref 19.0–186.0)

## 2011-08-20 LAB — ALBUMIN: Albumin: 2.2 g/dL — ABNORMAL LOW (ref 3.5–5.2)

## 2011-08-20 LAB — BASIC METABOLIC PANEL
BUN: 51 mg/dL — ABNORMAL HIGH (ref 6–23)
Chloride: 93 mEq/L — ABNORMAL LOW (ref 96–112)
GFR calc Af Amer: 13 mL/min — ABNORMAL LOW (ref >90)
GFR calc non Af Amer: 12 mL/min — ABNORMAL LOW (ref >90)
Potassium: 3 mEq/L — ABNORMAL LOW (ref 3.5–5.1)
Sodium: 129 mEq/L — ABNORMAL LOW (ref 135–145)

## 2011-08-20 LAB — PHOSPHORUS: Phosphorus: 2.9 mg/dL (ref 2.3–4.6)

## 2011-08-20 LAB — VITAMIN B12: Vitamin B-12: 331 pg/mL (ref 211–911)

## 2011-08-20 LAB — FERRITIN: Ferritin: 662 ng/mL — ABNORMAL HIGH (ref 22–322)

## 2011-08-20 LAB — PHENYTOIN LEVEL, TOTAL: Phenytoin Lvl: 6.1 ug/mL — ABNORMAL LOW (ref 10.0–20.0)

## 2011-08-20 MED ORDER — MIDAZOLAM HCL 5 MG/5ML IJ SOLN
INTRAMUSCULAR | Status: AC | PRN
  Administered 2011-08-20: 2 mg via INTRAVENOUS

## 2011-08-20 MED ORDER — FENTANYL CITRATE 0.05 MG/ML IJ SOLN
INTRAMUSCULAR | Status: AC | PRN
  Administered 2011-08-20 (×2): 50 ug via INTRAVENOUS

## 2011-08-20 NOTE — H&P (Signed)
[name] is an 56 y.o. male.   Chief Complaint: acute on chronic renal failure; worsening renal fxn Scheduled for hemodialysis catheter placement Pt has temp cath Rt groin; ?placed/ when? Last use Sat - successful: MD wants PC for eventual discharge from [facility] Has Rt IJ triple lumen line HPI: HTN; Sz; Hep C; CHF; Thrombocytopenia plts 53  Past Medical History  Diagnosis Date  . HTN (hypertension), malignant   . CKD (chronic kidney disease), stage III   . Renal artery stenosis     moderate  . Meningioma     s/p resection  . Seizure disorder   . Alcohol abuse     hx  . TIA (transient ischemic attack)   . Diastolic CHF, acute     acute?; echo 7/10 with EF 65%, moderate LVH  . HCV (hepatitis C virus)     Past Surgical History  Procedure Date  . Brain surgery     meningioma resection    No family history on file. Social History:  reports that he has been smoking Cigarettes.  He has a 39 pack-year smoking history. He does not have any smokeless tobacco history on file. His alcohol and drug histories not on file.  Allergies: No Known Allergies  Medications Prior to Admission  Medication Sig Dispense Refill  . amLODipine (NORVASC) 5 MG tablet Take 1 tablet (5 mg total) by mouth daily.      . carvedilol (COREG) 25 MG tablet Take 1 tablet (25 mg total) by mouth 2 (two) times daily with a meal.      . escitalopram (LEXAPRO) 20 MG tablet Take 1 tablet (20 mg total) by mouth daily.      . folic acid (FOLVITE) 1 MG tablet Take 1 tablet (1 mg total) by mouth daily.      Marland Kitchen labetalol (NORMODYNE,TRANDATE) 5 MG/ML injection Inject 1 mL (5 mg total) into the vein every 2 (two) hours as needed (>160/100).  20 mL    . lisinopril (PRINIVIL,ZESTRIL) 20 MG tablet Take 1 tablet (20 mg total) by mouth daily.      . methylPREDNISolone sodium succinate (SOLU-MEDROL) 125 mg/2 mL injection Inject 2 mLs (125 mg total) into the vein every 12 (twelve) hours.  1 each    . phenytoin (DILANTIN) 50  MG/ML injection Inject 2 mLs (100 mg total) into the vein every 8 (eight) hours.  2 mL    . sodium chloride 0.9 % SOLN 100 mL with levETIRAcetam 500 MG/5ML SOLN 500 mg Inject 500 mg into the vein every 12 (twelve) hours.      . thiamine 100 MG tablet Take 1 tablet (100 mg total) by mouth daily.        Results for orders placed during the hospital encounter of 08/15/11 (from the past 48 hour(s))  BASIC METABOLIC PANEL     Status: Abnormal   Collection Time   08/19/11  4:30 AM      Component Value Range Comment   Sodium 131 (*) 135 - 145 mEq/L    Potassium 3.3 (*) 3.5 - 5.1 mEq/L    Chloride 96  96 - 112 mEq/L    CO2 26  19 - 32 mEq/L    Glucose, Bld 124 (*) 70 - 99 mg/dL    BUN 38 (*) 6 - 23 mg/dL    Creatinine, Ser 1.61 (*) 0.50 - 1.35 mg/dL    Calcium 8.0 (*) 8.4 - 10.5 mg/dL    GFR calc non Af Amer 13 (*) >  90 mL/min    GFR calc Af Amer 15 (*) >90 mL/min   CBC WITH DIFFERENTIAL     Status: Abnormal   Collection Time   08/19/11  4:30 AM      Component Value Range Comment   WBC 6.2  4.0 - 10.5 K/uL    RBC 2.80 (*) 4.22 - 5.81 MIL/uL    Hemoglobin 8.7 (*) 13.0 - 17.0 g/dL    HCT 16.1 (*) 09.6 - 52.0 %    MCV 92.5  78.0 - 100.0 fL    MCH 31.1  26.0 - 34.0 pg    MCHC 33.6  30.0 - 36.0 g/dL    RDW 04.5 (*) 40.9 - 15.5 %    Platelets 44 (*) 150 - 400 K/uL CONSISTENT WITH PREVIOUS RESULT   Neutrophils Relative 91 (*) 43 - 77 %    Neutro Abs 5.6  1.7 - 7.7 K/uL    Lymphocytes Relative 5 (*) 12 - 46 %    Lymphs Abs 0.3 (*) 0.7 - 4.0 K/uL    Monocytes Relative 4  3 - 12 %    Monocytes Absolute 0.2  0.1 - 1.0 K/uL    Eosinophils Relative 0  0 - 5 %    Eosinophils Absolute 0.0  0.0 - 0.7 K/uL    Basophils Relative 0  0 - 1 %    Basophils Absolute 0.0  0.0 - 0.1 K/uL   CBC     Status: Abnormal   Collection Time   08/20/11  6:15 AM      Component Value Range Comment   WBC 7.3  4.0 - 10.5 K/uL    RBC 2.98 (*) 4.22 - 5.81 MIL/uL    Hemoglobin 9.5 (*) 13.0 - 17.0 g/dL    HCT 81.1 (*)  91.4 - 52.0 %    MCV 92.6  78.0 - 100.0 fL    MCH 31.9  26.0 - 34.0 pg    MCHC 34.4  30.0 - 36.0 g/dL    RDW 78.2 (*) 95.6 - 15.5 %    Platelets 53 (*) 150 - 400 K/uL CONSISTENT WITH PREVIOUS RESULT  RETICULOCYTES     Status: Abnormal   Collection Time   08/20/11  6:15 AM      Component Value Range Comment   Retic Ct Pct 2.9  0.4 - 3.1 %    RBC. 2.98 (*) 4.22 - 5.81 MIL/uL    Retic Count, Manual 86.4  19.0 - 186.0 K/uL   BASIC METABOLIC PANEL     Status: Abnormal   Collection Time   08/20/11  6:15 AM      Component Value Range Comment   Sodium 129 (*) 135 - 145 mEq/L    Potassium 3.0 (*) 3.5 - 5.1 mEq/L    Chloride 93 (*) 96 - 112 mEq/L    CO2 22  19 - 32 mEq/L    Glucose, Bld 83  70 - 99 mg/dL    BUN 51 (*) 6 - 23 mg/dL    Creatinine, Ser 2.13 (*) 0.50 - 1.35 mg/dL    Calcium 8.1 (*) 8.4 - 10.5 mg/dL    GFR calc non Af Amer 12 (*) >90 mL/min    GFR calc Af Amer 13 (*) >90 mL/min   PHENYTOIN LEVEL, TOTAL     Status: Abnormal   Collection Time   08/20/11  6:15 AM      Component Value Range Comment   Phenytoin Lvl 6.1 (*) 10.0 - 20.0  ug/mL   ALBUMIN     Status: Abnormal   Collection Time   08/20/11  6:15 AM      Component Value Range Comment   Albumin 2.2 (*) 3.5 - 5.2 g/dL   PHOSPHORUS     Status: Normal   Collection Time   08/20/11  6:15 AM      Component Value Range Comment   Phosphorus 2.9  2.3 - 4.6 mg/dL    Dg Abd Portable 1v  08/20/2011  *RADIOLOGY REPORT*  Clinical Data: Diarrhea  PORTABLE ABDOMEN - 1 VIEW  Comparison: 08/11/2011  Findings: Mild gaseous distended small bowel loops mid abdomen. Mild ileus or enteritis cannot be excluded.  No air-fluid levels. Stool noted within rectum. Stable central venous catheter right pelvis.  IMPRESSION: Mild gaseous distended small bowel loops mid abdomen.  Mild ileus or enteritis cannot be excluded.  No definite obstructive pattern.  Original Report Authenticated By: Natasha Mead, M.D.    Review of Systems  Constitutional: Positive  for weight loss. Negative for fever.  Respiratory: Negative for shortness of breath.   Cardiovascular: Negative for chest pain.  Gastrointestinal: Positive for nausea.  Neurological: Positive for seizures and weakness. Negative for headaches.    There were no vitals taken for this visit. Physical Exam  Constitutional: He appears well-developed and well-nourished.  Cardiovascular: Normal rate, regular rhythm and normal heart sounds.   No murmur heard. Respiratory: Effort normal. He has wheezes.  GI: Soft. Bowel sounds are normal. There is no tenderness.  Musculoskeletal: Normal range of motion.       weak  Neurological: He is alert.  Psychiatric: He has a normal mood and affect. Judgment and thought content normal.     Assessment/Plan Worsening renal fxn Acute on chronic renal failure Pt has come to [facility] with existing R groin temp cath; ?when placed MD wants perm cath; eventual dc Pt aware of procedure benefits and risks and agreeable to proceed, Consent signed and in chart  Jhaniya Briski A 08/20/2011, 11:12 AM

## 2011-08-20 NOTE — Procedures (Signed)
Successful placement of right IJ approach dialysis catheter with tip at superior aspect of the right atrium. The catheter is ready for immediate use. No immediate post procedural complications.  

## 2011-08-21 LAB — BASIC METABOLIC PANEL
BUN: 37 mg/dL — ABNORMAL HIGH (ref 6–23)
CO2: 25 mEq/L (ref 19–32)
Calcium: 8.1 mg/dL — ABNORMAL LOW (ref 8.4–10.5)
Creatinine, Ser: 3.86 mg/dL — ABNORMAL HIGH (ref 0.50–1.35)
GFR calc non Af Amer: 16 mL/min — ABNORMAL LOW (ref >90)
Glucose, Bld: 80 mg/dL (ref 70–99)

## 2011-08-21 LAB — HEPATITIS B SURFACE ANTIBODY,QUALITATIVE: Hep B S Ab: POSITIVE — AB

## 2011-08-21 LAB — PTH, INTACT AND CALCIUM: PTH: 87.9 pg/mL — ABNORMAL HIGH (ref 14.0–72.0)

## 2011-08-21 MED FILL — Heparin Sodium (Porcine) Inj 1000 Unit/ML: INTRAMUSCULAR | Qty: 10 | Status: AC

## 2011-08-21 MED FILL — Midazolam HCl Inj 2 MG/2ML (Base Equivalent): INTRAMUSCULAR | Qty: 4 | Status: AC

## 2011-08-21 MED FILL — Fentanyl Citrate Inj 0.05 MG/ML: INTRAMUSCULAR | Qty: 4 | Status: AC

## 2011-08-22 LAB — RENAL FUNCTION PANEL
Albumin: 2.2 g/dL — ABNORMAL LOW (ref 3.5–5.2)
BUN: 49 mg/dL — ABNORMAL HIGH (ref 6–23)
Calcium: 8.1 mg/dL — ABNORMAL LOW (ref 8.4–10.5)
Creatinine, Ser: 4.45 mg/dL — ABNORMAL HIGH (ref 0.50–1.35)
Glucose, Bld: 86 mg/dL (ref 70–99)
Phosphorus: 2 mg/dL — ABNORMAL LOW (ref 2.3–4.6)
Potassium: 3.6 mEq/L (ref 3.5–5.1)

## 2011-08-22 LAB — CBC
Hemoglobin: 9.3 g/dL — ABNORMAL LOW (ref 13.0–17.0)
Platelets: 54 10*3/uL — ABNORMAL LOW (ref 150–400)
RBC: 2.95 MIL/uL — ABNORMAL LOW (ref 4.22–5.81)
WBC: 7.1 10*3/uL (ref 4.0–10.5)

## 2011-08-23 DIAGNOSIS — M7989 Other specified soft tissue disorders: Secondary | ICD-10-CM

## 2011-08-23 LAB — BASIC METABOLIC PANEL
CO2: 32 mEq/L (ref 19–32)
GFR calc non Af Amer: 22 mL/min — ABNORMAL LOW (ref >90)
Glucose, Bld: 81 mg/dL (ref 70–99)
Potassium: 3.2 mEq/L — ABNORMAL LOW (ref 3.5–5.1)
Sodium: 137 mEq/L (ref 135–145)

## 2011-08-23 NOTE — Progress Notes (Signed)
VASCULAR LAB PRELIMINARY  PRELIMINARY  PRELIMINARY  PRELIMINARY  Bilateral lower extremity venous duplex completed.    Preliminary report:  Right - POSITIVE for DVT in the popliteal vein. Left - no evidence of DVT. Bilateral - No evidence of superficial thrombus or Baker's cyst.  Faythe Heitzenrater, RVS 08/23/2011, 11:20 AM

## 2011-08-23 NOTE — H&P (Signed)
[name] is an 56 y.o. male.   Chief Complaint: Rt popliteal DVT noted on doppler. Not an anti-coagulation candidate secondary to SDH. Request has been made for IVC filter placement.   HPI: patient developed RLE edema and pain. Doppler performed revealing acute DVT in right popliteal vein.   Past Medical History  Diagnosis Date  . HTN (hypertension), malignant   . CKD (chronic kidney disease), stage III   . Renal artery stenosis     moderate  . Meningioma     s/p resection  . Seizure disorder   . Alcohol abuse     hx  . TIA (transient ischemic attack)   . Diastolic CHF, acute     acute?; echo 7/10 with EF 65%, moderate LVH  . HCV (hepatitis C virus)     Past Surgical History  Procedure Date  . Brain surgery     meningioma resection    Social History:  reports that he has been smoking Cigarettes.  He has a 39 pack-year smoking history. He does not have any smokeless tobacco history on file. His alcohol and drug histories not on file.  Allergies: No Known Allergies  Medications Prior to Admission  Medication Sig Dispense Refill  . amLODipine (NORVASC) 5 MG tablet Take 1 tablet (5 mg total) by mouth daily.      . carvedilol (COREG) 25 MG tablet Take 1 tablet (25 mg total) by mouth 2 (two) times daily with a meal.      . escitalopram (LEXAPRO) 20 MG tablet Take 1 tablet (20 mg total) by mouth daily.      . folic acid (FOLVITE) 1 MG tablet Take 1 tablet (1 mg total) by mouth daily.      Marland Kitchen labetalol (NORMODYNE,TRANDATE) 5 MG/ML injection Inject 1 mL (5 mg total) into the vein every 2 (two) hours as needed (>160/100).  20 mL    . lisinopril (PRINIVIL,ZESTRIL) 20 MG tablet Take 1 tablet (20 mg total) by mouth daily.      . methylPREDNISolone sodium succinate (SOLU-MEDROL) 125 mg/2 mL injection Inject 2 mLs (125 mg total) into the vein every 12 (twelve) hours.  1 each    . phenytoin (DILANTIN) 50 MG/ML injection Inject 2 mLs (100 mg total) into the vein every 8 (eight) hours.   2 mL    . sodium chloride 0.9 % SOLN 100 mL with levETIRAcetam 500 MG/5ML SOLN 500 mg Inject 500 mg into the vein every 12 (twelve) hours.      . thiamine 100 MG tablet Take 1 tablet (100 mg total) by mouth daily.        Results for orders placed during the hospital encounter of 08/15/11 (from the past 48 hour(s))  RENAL FUNCTION PANEL     Status: Abnormal   Collection Time   08/22/11  5:50 AM      Component Value Range Comment   Sodium 132 (*) 135 - 145 mEq/L    Potassium 3.6  3.5 - 5.1 mEq/L    Chloride 96  96 - 112 mEq/L    CO2 24  19 - 32 mEq/L    Glucose, Bld 86  70 - 99 mg/dL    BUN 49 (*) 6 - 23 mg/dL    Creatinine, Ser 1.61 (*) 0.50 - 1.35 mg/dL    Calcium 8.1 (*) 8.4 - 10.5 mg/dL    Phosphorus 2.0 (*) 2.3 - 4.6 mg/dL    Albumin 2.2 (*) 3.5 - 5.2 g/dL  GFR calc non Af Amer 14 (*) >90 mL/min    GFR calc Af Amer 16 (*) >90 mL/min   CBC     Status: Abnormal   Collection Time   08/22/11  5:50 AM      Component Value Range Comment   WBC 7.1  4.0 - 10.5 K/uL    RBC 2.95 (*) 4.22 - 5.81 MIL/uL    Hemoglobin 9.3 (*) 13.0 - 17.0 g/dL    HCT 16.1 (*) 09.6 - 52.0 %    MCV 93.2  78.0 - 100.0 fL    MCH 31.5  26.0 - 34.0 pg    MCHC 33.8  30.0 - 36.0 g/dL    RDW 04.5 (*) 40.9 - 15.5 %    Platelets 54 (*) 150 - 400 K/uL CONSISTENT WITH PREVIOUS RESULT  BASIC METABOLIC PANEL     Status: Abnormal   Collection Time   08/23/11  5:55 AM      Component Value Range Comment   Sodium 137  135 - 145 mEq/L    Potassium 3.2 (*) 3.5 - 5.1 mEq/L    Chloride 98  96 - 112 mEq/L    CO2 32  19 - 32 mEq/L    Glucose, Bld 81  70 - 99 mg/dL    BUN 26 (*) 6 - 23 mg/dL DELTA CHECK NOTED   Creatinine, Ser 2.97 (*) 0.50 - 1.35 mg/dL    Calcium 8.1 (*) 8.4 - 10.5 mg/dL    GFR calc non Af Amer 22 (*) >90 mL/min    GFR calc Af Amer 26 (*) >90 mL/min    Review of Systems  Constitutional: Negative for fever and chills.  Respiratory: Negative for cough and hemoptysis.   Cardiovascular: Negative for  chest pain and palpitations.  Musculoskeletal: Positive for joint pain.  Neurological: Positive for seizures. Negative for headaches.  Endo/Heme/Allergies: Bruises/bleeds easily.  Psychiatric/Behavioral: Positive for memory loss.    Blood pressure 156/107, pulse 69, resp. rate 14, SpO2 95.00%. Physical Exam  Constitutional:       Thin appearing male   HENT:  Head: Normocephalic.  Cardiovascular: Normal rate.   No murmur heard. Respiratory: Effort normal and breath sounds normal.  Musculoskeletal: Normal range of motion. He exhibits tenderness. He exhibits no edema.       RLE at foot to behind knee area painful per patient   Neurological: He is alert.       Intermittent confusion - not consentable   Skin: Skin is warm and dry.     Assessment/Plan Procedure details, benefits and risks for IVC filter placement discussed with patient. This  would be a permanent filter type. While patient asked some appropriate questions - he is unable to comprehend benefits and risks.  Currently he is refusing - he was made aware we would contact his family and discuss the necessity of this procedure with them.  RN and PA made aware.     CAMPBELL,PAMELA D 08/23/2011, 1:06 PM

## 2011-08-24 ENCOUNTER — Other Ambulatory Visit (HOSPITAL_COMMUNITY): Payer: Self-pay

## 2011-08-24 LAB — RENAL FUNCTION PANEL
Albumin: 2.2 g/dL — ABNORMAL LOW (ref 3.5–5.2)
BUN: 35 mg/dL — ABNORMAL HIGH (ref 6–23)
Chloride: 99 mEq/L (ref 96–112)
GFR calc Af Amer: 21 mL/min — ABNORMAL LOW (ref >90)
Glucose, Bld: 78 mg/dL (ref 70–99)
Phosphorus: 2.5 mg/dL (ref 2.3–4.6)
Potassium: 3.5 mEq/L (ref 3.5–5.1)
Sodium: 136 mEq/L (ref 135–145)

## 2011-08-24 LAB — CBC
MCV: 95.5 fL (ref 78.0–100.0)
Platelets: 67 10*3/uL — ABNORMAL LOW (ref 150–400)
RBC: 2.91 MIL/uL — ABNORMAL LOW (ref 4.22–5.81)
RDW: 18.1 % — ABNORMAL HIGH (ref 11.5–15.5)
WBC: 5.4 10*3/uL (ref 4.0–10.5)

## 2011-08-24 MED ORDER — FENTANYL CITRATE 0.05 MG/ML IJ SOLN
INTRAMUSCULAR | Status: AC | PRN
  Administered 2011-08-24: 50 ug via INTRAVENOUS

## 2011-08-24 MED ORDER — MIDAZOLAM HCL 5 MG/5ML IJ SOLN
INTRAMUSCULAR | Status: AC | PRN
  Administered 2011-08-24: 1 mg via INTRAVENOUS

## 2011-08-24 NOTE — Procedures (Signed)
Placement of IVC filter with carbon dioxide.  No immediate complication.  Would consider future retrieval of this filter due to renal failure and may need femoral dialysis access in the future.

## 2011-08-25 LAB — BASIC METABOLIC PANEL
CO2: 32 mEq/L (ref 19–32)
Chloride: 99 mEq/L (ref 96–112)
Creatinine, Ser: 2.69 mg/dL — ABNORMAL HIGH (ref 0.50–1.35)
GFR calc Af Amer: 29 mL/min — ABNORMAL LOW (ref >90)
Potassium: 3.3 mEq/L — ABNORMAL LOW (ref 3.5–5.1)

## 2011-08-26 LAB — BASIC METABOLIC PANEL WITH GFR
BUN: 32 mg/dL — ABNORMAL HIGH (ref 6–23)
CO2: 29 meq/L (ref 19–32)
Calcium: 8.3 mg/dL — ABNORMAL LOW (ref 8.4–10.5)
Chloride: 99 meq/L (ref 96–112)
Creatinine, Ser: 3.48 mg/dL — ABNORMAL HIGH (ref 0.50–1.35)
GFR calc Af Amer: 21 mL/min — ABNORMAL LOW
GFR calc non Af Amer: 18 mL/min — ABNORMAL LOW
Glucose, Bld: 90 mg/dL (ref 70–99)
Potassium: 3.7 meq/L (ref 3.5–5.1)
Sodium: 136 meq/L (ref 135–145)

## 2011-08-27 LAB — RENAL FUNCTION PANEL
BUN: 21 mg/dL (ref 6–23)
CO2: 30 mEq/L (ref 19–32)
Calcium: 8.2 mg/dL — ABNORMAL LOW (ref 8.4–10.5)
GFR calc Af Amer: 29 mL/min — ABNORMAL LOW (ref >90)
Glucose, Bld: 156 mg/dL — ABNORMAL HIGH (ref 70–99)
Sodium: 138 mEq/L (ref 135–145)

## 2011-08-27 LAB — CBC
HCT: 29.9 % — ABNORMAL LOW (ref 39.0–52.0)
Hemoglobin: 9.9 g/dL — ABNORMAL LOW (ref 13.0–17.0)
MCH: 31.8 pg (ref 26.0–34.0)
MCHC: 33.1 g/dL (ref 30.0–36.0)
MCV: 96.1 fL (ref 78.0–100.0)
RBC: 3.11 MIL/uL — ABNORMAL LOW (ref 4.22–5.81)

## 2011-08-27 LAB — BASIC METABOLIC PANEL
BUN: 41 mg/dL — ABNORMAL HIGH (ref 6–23)
CO2: 29 mEq/L (ref 19–32)
Chloride: 97 mEq/L (ref 96–112)
GFR calc non Af Amer: 15 mL/min — ABNORMAL LOW (ref >90)
Glucose, Bld: 111 mg/dL — ABNORMAL HIGH (ref 70–99)
Potassium: 3.3 mEq/L — ABNORMAL LOW (ref 3.5–5.1)
Sodium: 136 mEq/L (ref 135–145)

## 2011-08-28 LAB — BASIC METABOLIC PANEL
CO2: 31 mEq/L (ref 19–32)
Chloride: 100 mEq/L (ref 96–112)
Creatinine, Ser: 3.32 mg/dL — ABNORMAL HIGH (ref 0.50–1.35)
Glucose, Bld: 81 mg/dL (ref 70–99)
Sodium: 138 mEq/L (ref 135–145)

## 2011-08-29 LAB — RENAL FUNCTION PANEL
BUN: 45 mg/dL — ABNORMAL HIGH (ref 6–23)
CO2: 29 mEq/L (ref 19–32)
GFR calc Af Amer: 19 mL/min — ABNORMAL LOW (ref >90)
Glucose, Bld: 84 mg/dL (ref 70–99)
Potassium: 3.2 mEq/L — ABNORMAL LOW (ref 3.5–5.1)
Sodium: 139 mEq/L (ref 135–145)

## 2011-08-29 LAB — CBC
HCT: 26.2 % — ABNORMAL LOW (ref 39.0–52.0)
Hemoglobin: 8.6 g/dL — ABNORMAL LOW (ref 13.0–17.0)
MCV: 97 fL (ref 78.0–100.0)
WBC: 8.5 10*3/uL (ref 4.0–10.5)

## 2011-08-29 LAB — CREATININE, URINE, 24 HOUR
Collection Interval-UCRE24: 24 hours
Creatinine, 24H Ur: 488 mg/d — ABNORMAL LOW (ref 800–2000)
Creatinine, Urine: 97.61 mg/dL

## 2011-08-30 LAB — BASIC METABOLIC PANEL
GFR calc Af Amer: 31 mL/min — ABNORMAL LOW (ref >90)
GFR calc non Af Amer: 27 mL/min — ABNORMAL LOW (ref >90)
Potassium: 3 mEq/L — ABNORMAL LOW (ref 3.5–5.1)
Sodium: 139 mEq/L (ref 135–145)

## 2011-08-31 LAB — RENAL FUNCTION PANEL
Albumin: 2.1 g/dL — ABNORMAL LOW (ref 3.5–5.2)
BUN: 38 mg/dL — ABNORMAL HIGH (ref 6–23)
Chloride: 101 mEq/L (ref 96–112)
Creatinine, Ser: 3.52 mg/dL — ABNORMAL HIGH (ref 0.50–1.35)
Glucose, Bld: 87 mg/dL (ref 70–99)

## 2011-08-31 LAB — CBC WITH DIFFERENTIAL/PLATELET
Eosinophils Absolute: 0 10*3/uL (ref 0.0–0.7)
Eosinophils Relative: 1 % (ref 0–5)
HCT: 24.9 % — ABNORMAL LOW (ref 39.0–52.0)
Lymphocytes Relative: 14 % (ref 12–46)
Lymphs Abs: 1 10*3/uL (ref 0.7–4.0)
MCH: 31.8 pg (ref 26.0–34.0)
MCV: 96.5 fL (ref 78.0–100.0)
Monocytes Absolute: 0.9 10*3/uL (ref 0.1–1.0)
Monocytes Relative: 12 % (ref 3–12)
Platelets: 92 10*3/uL — ABNORMAL LOW (ref 150–400)
RBC: 2.58 MIL/uL — ABNORMAL LOW (ref 4.22–5.81)
WBC: 7.5 10*3/uL (ref 4.0–10.5)

## 2011-09-01 LAB — BASIC METABOLIC PANEL
BUN: 24 mg/dL — ABNORMAL HIGH (ref 6–23)
CO2: 32 mEq/L (ref 19–32)
Calcium: 8.4 mg/dL (ref 8.4–10.5)
Creatinine, Ser: 2.54 mg/dL — ABNORMAL HIGH (ref 0.50–1.35)
GFR calc non Af Amer: 27 mL/min — ABNORMAL LOW (ref >90)
Glucose, Bld: 83 mg/dL (ref 70–99)
Sodium: 137 mEq/L (ref 135–145)

## 2011-09-02 LAB — BASIC METABOLIC PANEL
BUN: 37 mg/dL — ABNORMAL HIGH (ref 6–23)
Glucose, Bld: 82 mg/dL (ref 70–99)
Potassium: 4.1 mEq/L (ref 3.5–5.1)

## 2011-09-02 LAB — CBC
HCT: 25.8 % — ABNORMAL LOW (ref 39.0–52.0)
MCV: 96.3 fL (ref 78.0–100.0)
Platelets: 92 10*3/uL — ABNORMAL LOW (ref 150–400)
RBC: 2.68 MIL/uL — ABNORMAL LOW (ref 4.22–5.81)
WBC: 8.7 10*3/uL (ref 4.0–10.5)

## 2011-09-03 LAB — CBC
Hemoglobin: 8.1 g/dL — ABNORMAL LOW (ref 13.0–17.0)
MCH: 31.9 pg (ref 26.0–34.0)
RBC: 2.54 MIL/uL — ABNORMAL LOW (ref 4.22–5.81)

## 2011-09-03 LAB — RENAL FUNCTION PANEL
CO2: 26 mEq/L (ref 19–32)
Chloride: 103 mEq/L (ref 96–112)
Creatinine, Ser: 4.02 mg/dL — ABNORMAL HIGH (ref 0.50–1.35)
GFR calc non Af Amer: 15 mL/min — ABNORMAL LOW (ref >90)

## 2011-09-04 LAB — PHOSPHORUS: Phosphorus: 1.4 mg/dL — ABNORMAL LOW (ref 2.3–4.6)

## 2011-09-04 LAB — BASIC METABOLIC PANEL
CO2: 29 mEq/L (ref 19–32)
Calcium: 8.7 mg/dL (ref 8.4–10.5)
Chloride: 100 mEq/L (ref 96–112)
Glucose, Bld: 81 mg/dL (ref 70–99)
Potassium: 3.6 mEq/L (ref 3.5–5.1)
Sodium: 138 mEq/L (ref 135–145)

## 2011-09-05 LAB — MAGNESIUM: Magnesium: 1.4 mg/dL — ABNORMAL LOW (ref 1.5–2.5)

## 2011-09-05 LAB — RENAL FUNCTION PANEL
CO2: 27 mEq/L (ref 19–32)
Chloride: 100 mEq/L (ref 96–112)
GFR calc Af Amer: 19 mL/min — ABNORMAL LOW (ref >90)
GFR calc non Af Amer: 16 mL/min — ABNORMAL LOW (ref >90)
Potassium: 4.2 mEq/L (ref 3.5–5.1)
Sodium: 137 mEq/L (ref 135–145)

## 2011-09-05 LAB — CBC
Hemoglobin: 8.5 g/dL — ABNORMAL LOW (ref 13.0–17.0)
Platelets: 93 10*3/uL — ABNORMAL LOW (ref 150–400)
RBC: 2.68 MIL/uL — ABNORMAL LOW (ref 4.22–5.81)
WBC: 8.8 10*3/uL (ref 4.0–10.5)

## 2011-09-06 LAB — CBC WITH DIFFERENTIAL/PLATELET
Eosinophils Relative: 1 % (ref 0–5)
HCT: 25.7 % — ABNORMAL LOW (ref 39.0–52.0)
Lymphocytes Relative: 20 % (ref 12–46)
Lymphs Abs: 1.7 10*3/uL (ref 0.7–4.0)
MCV: 97 fL (ref 78.0–100.0)
Neutro Abs: 5.9 10*3/uL (ref 1.7–7.7)
Platelets: 108 10*3/uL — ABNORMAL LOW (ref 150–400)
RBC: 2.65 MIL/uL — ABNORMAL LOW (ref 4.22–5.81)
WBC: 8.5 10*3/uL (ref 4.0–10.5)

## 2011-09-06 LAB — RENAL FUNCTION PANEL
Albumin: 2.3 g/dL — ABNORMAL LOW (ref 3.5–5.2)
BUN: 48 mg/dL — ABNORMAL HIGH (ref 6–23)
Chloride: 105 mEq/L (ref 96–112)
Glucose, Bld: 85 mg/dL (ref 70–99)
Potassium: 4.2 mEq/L (ref 3.5–5.1)

## 2011-09-07 LAB — CREATININE CLEARANCE, URINE, 24 HOUR
Collection Interval-CRCL: 24 hours
Creatinine, 24H Ur: 375 mg/d — ABNORMAL LOW (ref 800–2000)
Creatinine, Urine: 25.03 mg/dL
Creatinine: 4.78 mg/dL — ABNORMAL HIGH (ref 0.50–1.35)
Urine Total Volume-CRCL: 1500 mL

## 2011-09-07 LAB — BASIC METABOLIC PANEL
BUN: 62 mg/dL — ABNORMAL HIGH (ref 6–23)
Calcium: 9.1 mg/dL (ref 8.4–10.5)
Creatinine, Ser: 4.78 mg/dL — ABNORMAL HIGH (ref 0.50–1.35)
GFR calc Af Amer: 14 mL/min — ABNORMAL LOW (ref >90)
GFR calc non Af Amer: 12 mL/min — ABNORMAL LOW (ref >90)
Potassium: 4 mEq/L (ref 3.5–5.1)

## 2011-09-08 LAB — PHOSPHORUS: Phosphorus: 3.7 mg/dL (ref 2.3–4.6)

## 2011-09-08 LAB — BASIC METABOLIC PANEL
CO2: 21 mEq/L (ref 19–32)
Glucose, Bld: 77 mg/dL (ref 70–99)

## 2011-09-09 LAB — BASIC METABOLIC PANEL
CO2: 19 mEq/L (ref 19–32)
Calcium: 9 mg/dL (ref 8.4–10.5)
Chloride: 103 mEq/L (ref 96–112)
Potassium: 3.6 mEq/L (ref 3.5–5.1)
Sodium: 136 mEq/L (ref 135–145)

## 2011-09-10 LAB — CBC
HCT: 26.8 % — ABNORMAL LOW (ref 39.0–52.0)
Hemoglobin: 8.9 g/dL — ABNORMAL LOW (ref 13.0–17.0)
MCV: 94.4 fL (ref 78.0–100.0)
Platelets: 138 10*3/uL — ABNORMAL LOW (ref 150–400)
RBC: 2.84 MIL/uL — ABNORMAL LOW (ref 4.22–5.81)
WBC: 9.7 10*3/uL (ref 4.0–10.5)

## 2011-09-10 LAB — RENAL FUNCTION PANEL
CO2: 19 mEq/L (ref 19–32)
Chloride: 101 mEq/L (ref 96–112)
Creatinine, Ser: 4.9 mg/dL — ABNORMAL HIGH (ref 0.50–1.35)
GFR calc non Af Amer: 12 mL/min — ABNORMAL LOW (ref >90)

## 2011-09-11 LAB — BASIC METABOLIC PANEL
BUN: 49 mg/dL — ABNORMAL HIGH (ref 6–23)
Creatinine, Ser: 3.55 mg/dL — ABNORMAL HIGH (ref 0.50–1.35)
GFR calc Af Amer: 21 mL/min — ABNORMAL LOW (ref >90)
GFR calc non Af Amer: 18 mL/min — ABNORMAL LOW (ref >90)
Potassium: 3.3 mEq/L — ABNORMAL LOW (ref 3.5–5.1)

## 2011-09-11 LAB — HEPATITIS PANEL, ACUTE
HCV Ab: REACTIVE — AB
Hep A IgM: NEGATIVE

## 2011-09-12 LAB — RENAL FUNCTION PANEL
Albumin: 2.8 g/dL — ABNORMAL LOW (ref 3.5–5.2)
GFR calc Af Amer: 16 mL/min — ABNORMAL LOW (ref >90)
Phosphorus: 4.4 mg/dL (ref 2.3–4.6)
Potassium: 3 mEq/L — ABNORMAL LOW (ref 3.5–5.1)
Sodium: 137 mEq/L (ref 135–145)

## 2011-09-12 LAB — CBC
Hemoglobin: 9.8 g/dL — ABNORMAL LOW (ref 13.0–17.0)
MCHC: 33.3 g/dL (ref 30.0–36.0)
Platelets: 125 10*3/uL — ABNORMAL LOW (ref 150–400)
RDW: 17.7 % — ABNORMAL HIGH (ref 11.5–15.5)

## 2011-09-13 LAB — CBC WITH DIFFERENTIAL/PLATELET
Basophils Absolute: 0 10*3/uL (ref 0.0–0.1)
Basophils Relative: 0 % (ref 0–1)
Eosinophils Relative: 1 % (ref 0–5)
Lymphocytes Relative: 19 % (ref 12–46)
Neutro Abs: 6.9 10*3/uL (ref 1.7–7.7)
Platelets: 100 10*3/uL — ABNORMAL LOW (ref 150–400)
RDW: 18 % — ABNORMAL HIGH (ref 11.5–15.5)
WBC: 9.5 10*3/uL (ref 4.0–10.5)

## 2011-09-13 LAB — RENAL FUNCTION PANEL
CO2: 29 mEq/L (ref 19–32)
Chloride: 93 mEq/L — ABNORMAL LOW (ref 96–112)
Creatinine, Ser: 4.28 mg/dL — ABNORMAL HIGH (ref 0.50–1.35)
GFR calc Af Amer: 17 mL/min — ABNORMAL LOW (ref >90)
GFR calc non Af Amer: 14 mL/min — ABNORMAL LOW (ref >90)
Potassium: 3.8 mEq/L (ref 3.5–5.1)

## 2011-09-13 LAB — COMPREHENSIVE METABOLIC PANEL
ALT: 18 U/L (ref 0–53)
AST: 21 U/L (ref 0–37)
Albumin: 2.7 g/dL — ABNORMAL LOW (ref 3.5–5.2)
CO2: 27 mEq/L (ref 19–32)
Calcium: 8.8 mg/dL (ref 8.4–10.5)
Chloride: 95 mEq/L — ABNORMAL LOW (ref 96–112)
GFR calc non Af Amer: 17 mL/min — ABNORMAL LOW (ref >90)
Sodium: 136 mEq/L (ref 135–145)

## 2011-09-13 LAB — PHOSPHORUS: Phosphorus: 3.8 mg/dL (ref 2.3–4.6)

## 2011-09-14 LAB — CBC
HCT: 29.4 % — ABNORMAL LOW (ref 39.0–52.0)
Hemoglobin: 9.9 g/dL — ABNORMAL LOW (ref 13.0–17.0)
RBC: 3.04 MIL/uL — ABNORMAL LOW (ref 4.22–5.81)
WBC: 9.8 10*3/uL (ref 4.0–10.5)

## 2011-09-14 LAB — BASIC METABOLIC PANEL
BUN: 77 mg/dL — ABNORMAL HIGH (ref 6–23)
Chloride: 99 mEq/L (ref 96–112)
GFR calc Af Amer: 14 mL/min — ABNORMAL LOW (ref >90)
GFR calc non Af Amer: 12 mL/min — ABNORMAL LOW (ref >90)
Potassium: 3.8 mEq/L (ref 3.5–5.1)
Sodium: 137 mEq/L (ref 135–145)

## 2011-09-15 LAB — BASIC METABOLIC PANEL
BUN: 49 mg/dL — ABNORMAL HIGH (ref 6–23)
Calcium: 9.2 mg/dL (ref 8.4–10.5)
Creatinine, Ser: 4.07 mg/dL — ABNORMAL HIGH (ref 0.50–1.35)
GFR calc Af Amer: 18 mL/min — ABNORMAL LOW (ref >90)
GFR calc non Af Amer: 15 mL/min — ABNORMAL LOW (ref >90)

## 2011-09-15 LAB — ALBUMIN: Albumin: 2.9 g/dL — ABNORMAL LOW (ref 3.5–5.2)

## 2011-09-16 LAB — BASIC METABOLIC PANEL
BUN: 74 mg/dL — ABNORMAL HIGH (ref 6–23)
Calcium: 9.3 mg/dL (ref 8.4–10.5)
Creatinine, Ser: 5.36 mg/dL — ABNORMAL HIGH (ref 0.50–1.35)
GFR calc non Af Amer: 11 mL/min — ABNORMAL LOW (ref >90)
Glucose, Bld: 85 mg/dL (ref 70–99)

## 2011-09-17 LAB — CBC
Hemoglobin: 9.9 g/dL — ABNORMAL LOW (ref 13.0–17.0)
MCH: 32 pg (ref 26.0–34.0)
MCHC: 33.6 g/dL (ref 30.0–36.0)
Platelets: 100 10*3/uL — ABNORMAL LOW (ref 150–400)
RDW: 17.8 % — ABNORMAL HIGH (ref 11.5–15.5)

## 2011-09-17 LAB — RENAL FUNCTION PANEL
Albumin: 2.8 g/dL — ABNORMAL LOW (ref 3.5–5.2)
Calcium: 9.2 mg/dL (ref 8.4–10.5)
GFR calc Af Amer: 11 mL/min — ABNORMAL LOW (ref >90)
Glucose, Bld: 85 mg/dL (ref 70–99)
Phosphorus: 6.6 mg/dL — ABNORMAL HIGH (ref 2.3–4.6)
Sodium: 133 mEq/L — ABNORMAL LOW (ref 135–145)

## 2011-09-17 LAB — PTH, INTACT AND CALCIUM
Calcium, Total (PTH): 8.6 mg/dL (ref 8.4–10.5)
PTH: 123.9 pg/mL — ABNORMAL HIGH (ref 14.0–72.0)

## 2011-09-18 LAB — RENAL FUNCTION PANEL
CO2: 31 mEq/L (ref 19–32)
Calcium: 9.2 mg/dL (ref 8.4–10.5)
Creatinine, Ser: 3.65 mg/dL — ABNORMAL HIGH (ref 0.50–1.35)
GFR calc non Af Amer: 17 mL/min — ABNORMAL LOW (ref >90)

## 2011-09-19 LAB — CBC WITH DIFFERENTIAL/PLATELET
Basophils Relative: 0 % (ref 0–1)
Eosinophils Relative: 2 % (ref 0–5)
Hemoglobin: 10.2 g/dL — ABNORMAL LOW (ref 13.0–17.0)
Lymphs Abs: 1.8 10*3/uL (ref 0.7–4.0)
MCH: 32.3 pg (ref 26.0–34.0)
MCV: 98.7 fL (ref 78.0–100.0)
Monocytes Absolute: 1 10*3/uL (ref 0.1–1.0)
RBC: 3.16 MIL/uL — ABNORMAL LOW (ref 4.22–5.81)

## 2011-09-19 LAB — RENAL FUNCTION PANEL
Albumin: 2.6 g/dL — ABNORMAL LOW (ref 3.5–5.2)
BUN: 70 mg/dL — ABNORMAL HIGH (ref 6–23)
CO2: 29 mEq/L (ref 19–32)
Chloride: 98 mEq/L (ref 96–112)
Creatinine, Ser: 5.11 mg/dL — ABNORMAL HIGH (ref 0.50–1.35)

## 2011-10-23 ENCOUNTER — Inpatient Hospital Stay: Payer: Self-pay | Admitting: Internal Medicine

## 2011-10-23 LAB — TROPONIN I: Troponin-I: <0.02 ng/mL

## 2011-10-23 LAB — URINALYSIS, COMPLETE
Bilirubin,UR: NEGATIVE
Blood: NEGATIVE
Glucose,UR: NEGATIVE mg/dL (ref 0–75)
Nitrite: NEGATIVE
Ph: 5 (ref 4.5–8.0)
RBC,UR: <1 /HPF (ref 0–5)
Squamous Epithelial: NONE SEEN

## 2011-10-23 LAB — CBC
HGB: 13.2 g/dL (ref 13.0–18.0)
MCH: 31.9 pg (ref 26.0–34.0)
MCHC: 34.2 g/dL (ref 32.0–36.0)
RBC: 4.13 10*6/uL — ABNORMAL LOW (ref 4.40–5.90)

## 2011-10-23 LAB — COMPREHENSIVE METABOLIC PANEL
Albumin: 2.9 g/dL — ABNORMAL LOW (ref 3.4–5.0)
Alkaline Phosphatase: 149 U/L — ABNORMAL HIGH (ref 50–136)
BUN: 12 mg/dL (ref 7–18)
Creatinine: 2.59 mg/dL — ABNORMAL HIGH (ref 0.60–1.30)
EGFR (African American): 31 — ABNORMAL LOW
EGFR (Non-African Amer.): 27 — ABNORMAL LOW
Glucose: 123 mg/dL — ABNORMAL HIGH (ref 65–99)
Potassium: 3.3 mmol/L — ABNORMAL LOW (ref 3.5–5.1)
Sodium: 134 mmol/L — ABNORMAL LOW (ref 136–145)

## 2011-10-23 LAB — DRUG SCREEN, URINE
Amphetamines, Ur Screen: NEGATIVE (ref ?–1000)
Benzodiazepine, Ur Scrn: NEGATIVE (ref ?–200)
Cannabinoid 50 Ng, Ur ~~LOC~~: NEGATIVE (ref ?–50)
MDMA (Ecstasy)Ur Screen: NEGATIVE (ref ?–500)
Tricyclic, Ur Screen: NEGATIVE (ref ?–1000)

## 2011-10-23 LAB — TSH: Thyroid Stimulating Horm: 2.46 u[IU]/mL

## 2011-10-23 LAB — CK TOTAL AND CKMB (NOT AT ARMC)
CK, Total: 48 U/L (ref 35–232)
CK-MB: 2.1 ng/mL (ref 0.5–3.6)

## 2011-10-24 LAB — COMPREHENSIVE METABOLIC PANEL
Albumin: 2.6 g/dL — ABNORMAL LOW (ref 3.4–5.0)
Anion Gap: 10 (ref 7–16)
BUN: 16 mg/dL (ref 7–18)
Calcium, Total: 7.9 mg/dL — ABNORMAL LOW (ref 8.5–10.1)
Chloride: 97 mmol/L — ABNORMAL LOW (ref 98–107)
EGFR (African American): 26 — ABNORMAL LOW
Glucose: 83 mg/dL (ref 65–99)
Osmolality: 269 (ref 275–301)
Potassium: 3.4 mmol/L — ABNORMAL LOW (ref 3.5–5.1)
Sodium: 134 mmol/L — ABNORMAL LOW (ref 136–145)
Total Protein: 6.8 g/dL (ref 6.4–8.2)

## 2011-10-24 LAB — CBC WITH DIFFERENTIAL/PLATELET
Eosinophil %: 1 %
Lymphocyte %: 23 %
MCH: 31.6 pg (ref 26.0–34.0)
Monocyte #: 1 x10 3/mm (ref 0.2–1.0)
Monocyte %: 14 %
Neutrophil %: 61.2 %
Platelet: 113 10*3/uL — ABNORMAL LOW (ref 150–440)
RBC: 3.94 10*6/uL — ABNORMAL LOW (ref 4.40–5.90)
WBC: 7.1 10*3/uL (ref 3.8–10.6)

## 2011-10-25 LAB — PHOSPHORUS: Phosphorus: 6.1 mg/dL — ABNORMAL HIGH (ref 2.5–4.9)

## 2011-11-21 ENCOUNTER — Ambulatory Visit: Payer: Self-pay | Admitting: Vascular Surgery

## 2011-11-28 ENCOUNTER — Ambulatory Visit: Payer: Self-pay | Admitting: Vascular Surgery

## 2011-11-28 LAB — CBC
HCT: 44.2 % (ref 40.0–52.0)
HGB: 14.6 g/dL (ref 13.0–18.0)
MCHC: 33 g/dL (ref 32.0–36.0)
RBC: 4.56 10*6/uL (ref 4.40–5.90)
WBC: 6.3 10*3/uL (ref 3.8–10.6)

## 2011-11-28 LAB — BASIC METABOLIC PANEL
Chloride: 97 mmol/L — ABNORMAL LOW (ref 98–107)
Co2: 28 mmol/L (ref 21–32)
Creatinine: 4.41 mg/dL — ABNORMAL HIGH (ref 0.60–1.30)
EGFR (African American): 16 — ABNORMAL LOW
Potassium: 4.1 mmol/L (ref 3.5–5.1)
Sodium: 136 mmol/L (ref 136–145)

## 2011-12-05 ENCOUNTER — Ambulatory Visit: Payer: Self-pay | Admitting: Vascular Surgery

## 2011-12-31 ENCOUNTER — Ambulatory Visit: Payer: Self-pay | Admitting: Vascular Surgery

## 2012-02-09 ENCOUNTER — Emergency Department: Payer: Self-pay | Admitting: Emergency Medicine

## 2012-02-09 LAB — CBC WITH DIFFERENTIAL/PLATELET
Basophil #: 0 10*3/uL (ref 0.0–0.1)
Basophil %: 0.6 %
Eosinophil %: 0.2 %
HCT: 33.3 % — ABNORMAL LOW (ref 40.0–52.0)
HGB: 10.7 g/dL — ABNORMAL LOW (ref 13.0–18.0)
Lymphocyte %: 7.7 %
MCHC: 32 g/dL (ref 32.0–36.0)
MCV: 100 fL (ref 80–100)

## 2012-02-09 LAB — COMPREHENSIVE METABOLIC PANEL
Albumin: 3 g/dL — ABNORMAL LOW (ref 3.4–5.0)
Alkaline Phosphatase: 223 U/L — ABNORMAL HIGH (ref 50–136)
Anion Gap: 9 (ref 7–16)
Bilirubin,Total: 2.2 mg/dL — ABNORMAL HIGH (ref 0.2–1.0)
Calcium, Total: 8.1 mg/dL — ABNORMAL LOW (ref 8.5–10.1)
Co2: 29 mmol/L (ref 21–32)
EGFR (African American): 20 — ABNORMAL LOW
EGFR (Non-African Amer.): 17 — ABNORMAL LOW
Glucose: 125 mg/dL — ABNORMAL HIGH (ref 65–99)
Osmolality: 276 (ref 275–301)
SGOT(AST): 397 U/L — ABNORMAL HIGH (ref 15–37)
SGPT (ALT): 155 U/L — ABNORMAL HIGH (ref 12–78)
Total Protein: 8.2 g/dL (ref 6.4–8.2)

## 2012-02-09 LAB — PHENYTOIN LEVEL, TOTAL: Dilantin: 5.4 ug/mL — ABNORMAL LOW (ref 10.0–20.0)

## 2012-02-09 LAB — AMMONIA: Ammonia, Plasma: 33 mcmol/L — ABNORMAL HIGH (ref 11–32)

## 2012-02-23 ENCOUNTER — Inpatient Hospital Stay: Payer: Self-pay | Admitting: Internal Medicine

## 2012-02-23 LAB — CBC
HCT: 25 % — ABNORMAL LOW (ref 40.0–52.0)
HGB: 8.6 g/dL — ABNORMAL LOW (ref 13.0–18.0)
MCH: 34.5 pg — ABNORMAL HIGH (ref 26.0–34.0)
MCHC: 34.4 g/dL (ref 32.0–36.0)
MCV: 100 fL (ref 80–100)
Platelet: 88 10*3/uL — ABNORMAL LOW (ref 150–440)
RBC: 2.49 10*6/uL — ABNORMAL LOW (ref 4.40–5.90)
RDW: 21.4 % — ABNORMAL HIGH (ref 11.5–14.5)
WBC: 11.6 10*3/uL — ABNORMAL HIGH (ref 3.8–10.6)

## 2012-02-23 LAB — BASIC METABOLIC PANEL
Anion Gap: 20 — ABNORMAL HIGH (ref 7–16)
BUN: 138 mg/dL — ABNORMAL HIGH (ref 7–18)
Calcium, Total: 8.2 mg/dL — ABNORMAL LOW (ref 8.5–10.1)
EGFR (African American): 4 — ABNORMAL LOW
EGFR (Non-African Amer.): 3 — ABNORMAL LOW
Glucose: 114 mg/dL — ABNORMAL HIGH (ref 65–99)
Potassium: 2.7 mmol/L — ABNORMAL LOW (ref 3.5–5.1)

## 2012-02-23 LAB — HEPATIC FUNCTION PANEL A (ARMC)
Albumin: 2.9 g/dL — ABNORMAL LOW (ref 3.4–5.0)
Alkaline Phosphatase: 181 U/L — ABNORMAL HIGH (ref 50–136)
Bilirubin,Total: 3.6 mg/dL — ABNORMAL HIGH (ref 0.2–1.0)
SGOT(AST): 49 U/L — ABNORMAL HIGH (ref 15–37)
SGPT (ALT): 52 U/L (ref 12–78)
Total Protein: 9.1 g/dL — ABNORMAL HIGH (ref 6.4–8.2)

## 2012-02-23 LAB — CK TOTAL AND CKMB (NOT AT ARMC)
CK, Total: 42 U/L (ref 35–232)
CK-MB: 1.3 ng/mL (ref 0.5–3.6)

## 2012-02-23 LAB — TROPONIN I: Troponin-I: 0.04 ng/mL

## 2012-02-23 LAB — PROTIME-INR: Prothrombin Time: 14.1 secs (ref 11.5–14.7)

## 2012-02-23 LAB — PHOSPHORUS: Phosphorus: 6.1 mg/dL — ABNORMAL HIGH (ref 2.5–4.9)

## 2012-02-24 LAB — CBC WITH DIFFERENTIAL/PLATELET
Bands: 2 %
Bands: 1 %
Eosinophil: 1 %
HCT: 20.5 % — ABNORMAL LOW (ref 40.0–52.0)
HGB: 7 g/dL — ABNORMAL LOW (ref 13.0–18.0)
Lymphocytes: 14 %
Lymphocytes: 12 %
MCH: 35.3 pg — ABNORMAL HIGH (ref 26.0–34.0)
MCH: 35 pg — ABNORMAL HIGH (ref 26.0–34.0)
MCHC: 34.3 g/dL (ref 32.0–36.0)
MCV: 101 fL — ABNORMAL HIGH (ref 80–100)
Metamyelocyte: 1 %
Monocytes: 4 %
Myelocyte: 2 %
Myelocyte: 1 %
Other Cells Blood: 1
RBC: 2 10*6/uL — ABNORMAL LOW (ref 4.40–5.90)
RBC: 2.01 10*6/uL — ABNORMAL LOW (ref 4.40–5.90)
RDW: 21.1 % — ABNORMAL HIGH (ref 11.5–14.5)
RDW: 20.7 % — ABNORMAL HIGH (ref 11.5–14.5)
Segmented Neutrophils: 78 %
WBC: 8.9 10*3/uL (ref 3.8–10.6)

## 2012-02-24 LAB — COMPREHENSIVE METABOLIC PANEL
Alkaline Phosphatase: 134 U/L (ref 50–136)
Anion Gap: 15 (ref 7–16)
BUN: 139 mg/dL — ABNORMAL HIGH (ref 7–18)
Bilirubin,Total: 2.2 mg/dL — ABNORMAL HIGH (ref 0.2–1.0)
Calcium, Total: 7.6 mg/dL — ABNORMAL LOW (ref 8.5–10.1)
Chloride: 109 mmol/L — ABNORMAL HIGH (ref 98–107)
Creatinine: 13.5 mg/dL — ABNORMAL HIGH (ref 0.60–1.30)
Glucose: 101 mg/dL — ABNORMAL HIGH (ref 65–99)
Osmolality: 321 (ref 275–301)
Potassium: 2.7 mmol/L — ABNORMAL LOW (ref 3.5–5.1)
SGOT(AST): 31 U/L (ref 15–37)
SGPT (ALT): 38 U/L (ref 12–78)

## 2012-02-24 LAB — HEMOGLOBIN: HGB: 6.7 g/dL — ABNORMAL LOW (ref 13.0–18.0)

## 2012-02-25 LAB — CBC WITH DIFFERENTIAL/PLATELET
Bands: 3 %
Comment - H1-Com7: NORMAL
Eosinophil: 1 %
HGB: 7.5 g/dL — ABNORMAL LOW (ref 13.0–18.0)
Lymphocytes: 15 %
MCH: 35.2 pg — ABNORMAL HIGH (ref 26.0–34.0)
MCV: 102 fL — ABNORMAL HIGH (ref 80–100)
Metamyelocyte: 3 %
Monocytes: 6 %
Platelet: 65 10*3/uL — ABNORMAL LOW (ref 150–440)
RBC: 2.13 10*6/uL — ABNORMAL LOW (ref 4.40–5.90)

## 2012-02-25 LAB — RENAL FUNCTION PANEL
Albumin: 2.2 g/dL — ABNORMAL LOW (ref 3.4–5.0)
Anion Gap: 13 (ref 7–16)
BUN: 56 mg/dL — ABNORMAL HIGH (ref 7–18)
Chloride: 104 mmol/L (ref 98–107)
Co2: 22 mmol/L (ref 21–32)
EGFR (African American): 8 — ABNORMAL LOW
EGFR (Non-African Amer.): 6 — ABNORMAL LOW
Glucose: 136 mg/dL — ABNORMAL HIGH (ref 65–99)
Osmolality: 295 (ref 275–301)
Phosphorus: 3 mg/dL (ref 2.5–4.9)

## 2012-02-25 LAB — STOOL CULTURE

## 2012-03-01 ENCOUNTER — Ambulatory Visit: Payer: Self-pay | Admitting: Internal Medicine

## 2012-03-20 ENCOUNTER — Emergency Department: Payer: Self-pay | Admitting: Emergency Medicine

## 2012-03-20 LAB — COMPREHENSIVE METABOLIC PANEL
Albumin: 2.2 g/dL — ABNORMAL LOW (ref 3.4–5.0)
Anion Gap: 20 — ABNORMAL HIGH (ref 7–16)
Bilirubin,Total: 1.2 mg/dL — ABNORMAL HIGH (ref 0.2–1.0)
Chloride: 105 mmol/L (ref 98–107)
Co2: 14 mmol/L — ABNORMAL LOW (ref 21–32)
Creatinine: 21.16 mg/dL — ABNORMAL HIGH (ref 0.60–1.30)
EGFR (African American): 2 — ABNORMAL LOW
EGFR (Non-African Amer.): 2 — ABNORMAL LOW
Glucose: 111 mg/dL — ABNORMAL HIGH (ref 65–99)
Osmolality: 317 (ref 275–301)
Sodium: 139 mmol/L (ref 136–145)
Total Protein: 7.4 g/dL (ref 6.4–8.2)

## 2012-03-20 LAB — CBC
HCT: 24.7 % — ABNORMAL LOW (ref 40.0–52.0)
HGB: 8.4 g/dL — ABNORMAL LOW (ref 13.0–18.0)
MCHC: 33.9 g/dL (ref 32.0–36.0)
Platelet: 102 10*3/uL — ABNORMAL LOW (ref 150–440)
RBC: 2.42 10*6/uL — ABNORMAL LOW (ref 4.40–5.90)
RDW: 18.5 % — ABNORMAL HIGH (ref 11.5–14.5)
WBC: 9.9 10*3/uL (ref 3.8–10.6)

## 2012-03-20 LAB — LIPASE, BLOOD: Lipase: 443 U/L — ABNORMAL HIGH (ref 73–393)

## 2012-03-20 LAB — CLOSTRIDIUM DIFFICILE BY PCR

## 2012-03-24 ENCOUNTER — Inpatient Hospital Stay: Payer: Self-pay | Admitting: Internal Medicine

## 2012-03-24 LAB — CBC
HCT: 22.3 % — ABNORMAL LOW (ref 40.0–52.0)
HGB: 7.5 g/dL — ABNORMAL LOW (ref 13.0–18.0)
MCHC: 33.7 g/dL (ref 32.0–36.0)
MCV: 101 fL — ABNORMAL HIGH (ref 80–100)
RDW: 18.3 % — ABNORMAL HIGH (ref 11.5–14.5)
WBC: 5.5 10*3/uL (ref 3.8–10.6)

## 2012-03-24 LAB — COMPREHENSIVE METABOLIC PANEL
Albumin: 2.3 g/dL — ABNORMAL LOW (ref 3.4–5.0)
Alkaline Phosphatase: 141 U/L — ABNORMAL HIGH (ref 50–136)
Anion Gap: 20 — ABNORMAL HIGH (ref 7–16)
Bilirubin,Total: 0.8 mg/dL (ref 0.2–1.0)
Chloride: 105 mmol/L (ref 98–107)
Co2: 13 mmol/L — ABNORMAL LOW (ref 21–32)
Creatinine: 19.8 mg/dL — ABNORMAL HIGH (ref 0.60–1.30)
EGFR (African American): 3 — ABNORMAL LOW
EGFR (Non-African Amer.): 2 — ABNORMAL LOW
Osmolality: 304 (ref 275–301)
SGPT (ALT): 33 U/L (ref 12–78)
Sodium: 138 mmol/L (ref 136–145)
Total Protein: 7.7 g/dL (ref 6.4–8.2)

## 2012-03-24 LAB — TROPONIN I: Troponin-I: 0.06 ng/mL — ABNORMAL HIGH

## 2012-03-24 LAB — CK TOTAL AND CKMB (NOT AT ARMC): CK-MB: 1.2 ng/mL (ref 0.5–3.6)

## 2012-03-25 LAB — BASIC METABOLIC PANEL
Calcium, Total: 7.8 mg/dL — ABNORMAL LOW (ref 8.5–10.1)
Co2: 14 mmol/L — ABNORMAL LOW (ref 21–32)
Creatinine: 20.07 mg/dL — ABNORMAL HIGH (ref 0.60–1.30)
EGFR (African American): 3 — ABNORMAL LOW
EGFR (Non-African Amer.): 2 — ABNORMAL LOW
Glucose: 87 mg/dL (ref 65–99)
Sodium: 137 mmol/L (ref 136–145)

## 2012-03-25 LAB — CBC WITH DIFFERENTIAL/PLATELET
Basophil #: 0 10*3/uL (ref 0.0–0.1)
Eosinophil %: 0.1 %
Monocyte #: 0.6 x10 3/mm (ref 0.2–1.0)
Monocyte %: 12 %
Neutrophil #: 3.7 10*3/uL (ref 1.4–6.5)
Neutrophil %: 78 %
RBC: 1.95 10*6/uL — ABNORMAL LOW (ref 4.40–5.90)
RDW: 18.3 % — ABNORMAL HIGH (ref 11.5–14.5)
WBC: 4.7 10*3/uL (ref 3.8–10.6)

## 2012-03-25 LAB — POTASSIUM: Potassium: 2.2 mmol/L — CL (ref 3.5–5.1)

## 2012-03-25 LAB — MAGNESIUM: Magnesium: 1.3 mg/dL — ABNORMAL LOW

## 2012-03-25 LAB — CLOSTRIDIUM DIFFICILE BY PCR

## 2012-03-25 LAB — LACTATE DEHYDROGENASE: LDH: 163 U/L (ref 85–241)

## 2012-03-26 LAB — BASIC METABOLIC PANEL
BUN: 46 mg/dL — ABNORMAL HIGH (ref 7–18)
Chloride: 101 mmol/L (ref 98–107)
EGFR (African American): 5 — ABNORMAL LOW
EGFR (Non-African Amer.): 4 — ABNORMAL LOW
Potassium: 2.4 mmol/L — CL (ref 3.5–5.1)
Sodium: 135 mmol/L — ABNORMAL LOW (ref 136–145)

## 2012-03-26 LAB — PROTIME-INR: Prothrombin Time: 15.6 secs — ABNORMAL HIGH (ref 11.5–14.7)

## 2012-03-26 LAB — CBC WITH DIFFERENTIAL/PLATELET
Bands: 1 %
Basophil #: 0 10*3/uL (ref 0.0–0.1)
Basophil %: 0.6 %
Eosinophil %: 0.2 %
HCT: 22.5 % — ABNORMAL LOW (ref 40.0–52.0)
Lymphocyte #: 0.6 10*3/uL — ABNORMAL LOW (ref 1.0–3.6)
Lymphocyte %: 11.7 %
MCH: 33.9 pg (ref 26.0–34.0)
MCHC: 34.1 g/dL (ref 32.0–36.0)
MCV: 99 fL (ref 80–100)
Metamyelocyte: 1 %
Monocyte %: 8.1 %
Monocytes: 8 %
Neutrophil %: 79.4 %
Platelet: 43 10*3/uL — ABNORMAL LOW (ref 150–440)
RBC: 2.26 10*6/uL — ABNORMAL LOW (ref 4.40–5.90)
RDW: 18.6 % — ABNORMAL HIGH (ref 11.5–14.5)
Segmented Neutrophils: 78 %
WBC: 5 10*3/uL (ref 3.8–10.6)

## 2012-03-26 LAB — PROTEIN ELECTROPHORESIS(ARMC)

## 2012-03-26 LAB — MAGNESIUM: Magnesium: 3 mg/dL — ABNORMAL HIGH

## 2012-03-26 LAB — IRON AND TIBC
Iron Saturation: 32 %
Unbound Iron-Bind.Cap.: 50 ug/dL

## 2012-03-26 LAB — RETICULOCYTES
Absolute Retic Count: 0.0369 10*6/uL
Reticulocyte: 1.63 %

## 2012-03-26 LAB — KAPPA/LAMBDA FREE LIGHT CHAINS (ARMC)

## 2012-03-26 LAB — LACTATE DEHYDROGENASE: LDH: 184 U/L (ref 85–241)

## 2012-03-27 LAB — COMPREHENSIVE METABOLIC PANEL
Albumin: 1.7 g/dL — ABNORMAL LOW (ref 3.4–5.0)
Anion Gap: 10 (ref 7–16)
BUN: 52 mg/dL — ABNORMAL HIGH (ref 7–18)
Creatinine: 13.23 mg/dL — ABNORMAL HIGH (ref 0.60–1.30)
EGFR (Non-African Amer.): 4 — ABNORMAL LOW
Glucose: 91 mg/dL (ref 65–99)
Potassium: 2.8 mmol/L — ABNORMAL LOW (ref 3.5–5.1)
SGPT (ALT): 19 U/L (ref 12–78)
Total Protein: 5.9 g/dL — ABNORMAL LOW (ref 6.4–8.2)

## 2012-03-29 ENCOUNTER — Ambulatory Visit: Payer: Self-pay | Admitting: Internal Medicine

## 2012-03-29 LAB — COMPREHENSIVE METABOLIC PANEL
BUN: 42 mg/dL — ABNORMAL HIGH (ref 7–18)
Bilirubin,Total: 0.8 mg/dL (ref 0.2–1.0)
Calcium, Total: 7.3 mg/dL — ABNORMAL LOW (ref 8.5–10.1)
Creatinine: 10.64 mg/dL — ABNORMAL HIGH (ref 0.60–1.30)
Glucose: 80 mg/dL (ref 65–99)
Osmolality: 278 (ref 275–301)
SGOT(AST): 34 U/L (ref 15–37)
SGPT (ALT): 16 U/L (ref 12–78)
Sodium: 134 mmol/L — ABNORMAL LOW (ref 136–145)
Total Protein: 5.4 g/dL — ABNORMAL LOW (ref 6.4–8.2)

## 2012-03-29 LAB — CBC WITH DIFFERENTIAL/PLATELET
Basophil %: 1.2 %
Eosinophil %: 0.8 %
HCT: 21.2 % — ABNORMAL LOW (ref 40.0–52.0)
HGB: 7.3 g/dL — ABNORMAL LOW (ref 13.0–18.0)
Lymphocyte %: 23.6 %
MCH: 33.6 pg (ref 26.0–34.0)
MCV: 98 fL (ref 80–100)
Monocyte #: 0.7 x10 3/mm (ref 0.2–1.0)
Monocyte %: 15.7 %
Neutrophil #: 2.7 10*3/uL (ref 1.4–6.5)
Platelet: 36 10*3/uL — ABNORMAL LOW (ref 150–440)
RBC: 2.17 10*6/uL — ABNORMAL LOW (ref 4.40–5.90)
RDW: 17.7 % — ABNORMAL HIGH (ref 11.5–14.5)

## 2012-03-30 LAB — CBC WITH DIFFERENTIAL/PLATELET
Basophil #: 0 10*3/uL (ref 0.0–0.1)
Eosinophil %: 1.3 %
HCT: 22.4 % — ABNORMAL LOW (ref 40.0–52.0)
HGB: 7.7 g/dL — ABNORMAL LOW (ref 13.0–18.0)
Lymphocyte #: 1.2 10*3/uL (ref 1.0–3.6)
MCHC: 34.2 g/dL (ref 32.0–36.0)
MCV: 98 fL (ref 80–100)
Monocyte #: 1.1 x10 3/mm — ABNORMAL HIGH (ref 0.2–1.0)
Monocyte %: 21.3 %
Neutrophil #: 2.8 10*3/uL (ref 1.4–6.5)
Neutrophil %: 54.2 %
Platelet: 35 10*3/uL — ABNORMAL LOW (ref 150–440)
RDW: 18.1 % — ABNORMAL HIGH (ref 11.5–14.5)

## 2012-03-30 LAB — MAGNESIUM: Magnesium: 1.6 mg/dL — ABNORMAL LOW

## 2012-03-31 LAB — BASIC METABOLIC PANEL
Anion Gap: 7 (ref 7–16)
Chloride: 94 mmol/L — ABNORMAL LOW (ref 98–107)
Co2: 28 mmol/L (ref 21–32)
Creatinine: 7.95 mg/dL — ABNORMAL HIGH (ref 0.60–1.30)
EGFR (African American): 8 — ABNORMAL LOW
Potassium: 3.1 mmol/L — ABNORMAL LOW (ref 3.5–5.1)

## 2012-04-29 ENCOUNTER — Ambulatory Visit: Payer: Self-pay | Admitting: Internal Medicine

## 2012-05-29 DEATH — deceased

## 2013-04-17 IMAGING — US IR FLUORO GUIDE CV LINE*R*
1 series · 1 of 1 positions shown · non-contrast
Comparison: none

INDICATION: End stage renal disease, in need of intravenous access
for hemodialysis.

[Series 1: ir fluoro guide cv line*right* · 1 of 1 slices shown]
[im 1/1]
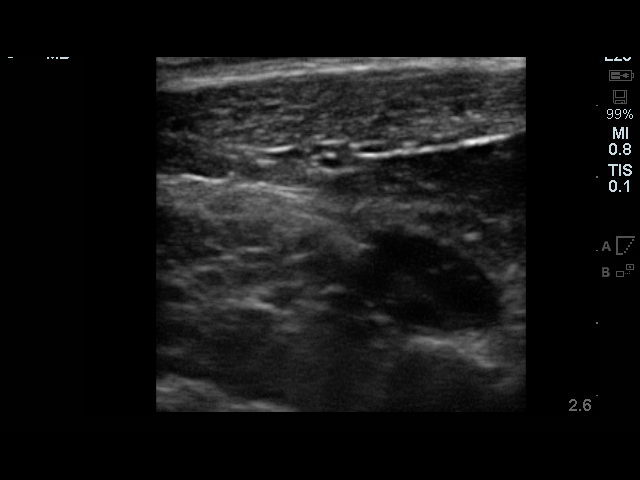

[1 of 1 positions shown; findings below may reference images not displayed]

TUNNELED CENTRAL VENOUS HEMODIALYSIS CATHETER PLACEMENT WITH
ULTRASOUND AND FLUOROSCOPIC GUIDANCE

Medications: Versed 2 mg IV; Fentanyl 100 mcg IV; Ancef 1 gm IV;
The IV antibiotic was given in an appropriate time interval prior
to skin puncture.

Contrast: None

Total Moderate Sedation Time: 15 minutes.

Fluoroscopy Time: 0.4 minutes.

Complications:  None immediate

Findings / Procedure:

Informed written consent was obtained from the patient after a
discussion of the risks, benefits, and alternatives to treatment.
Questions regarding the procedure were encouraged and answered.
The right neck and chest were prepped with chlorhexidine in a
sterile fashion, and a sterile drape was applied covering the
operative field.  Maximum barrier sterile technique with sterile
gowns and gloves were used for the procedure.  A timeout was
performed prior to the initiation of the procedure.

After creating a small venotomy incision, a micropuncture kit was
utilized to access the right internal jugular vein under direct,
real-time ultrasound guidance after the overlying soft tissues were
anesthetized with 1% lidocaine with epinephrine..  Ultrasound image
documentation was performed.  The microwire was kinked to measure
appropriate catheter length.  A stiff glidewire was advanced to the
level of the IVC and the micropuncture sheath was exchanged for a
peel-away sheath.  A HemoSplit tunneled hemodialysis catheter
measuring 19 cm from tip to cuff was tunneled in a retrograde
fashion from the anterior chest wall to the venotomy incision.

The catheter was then placed through the peel-away sheath with tips
ultimately positioned within the superior aspect of the right
atrium.  Final catheter positioning was confirmed and documented
with a spot radiographic image.  The catheter aspirates and flushes
normally.  The catheter was flushed with appropriate volume heparin
dwells.

The catheter exit site was secured with a 0-Prolene retention
suture.  The venotomy incision was closed with an interrupted 4-0
Vicryl, Dermabond and Nt.  Dressings were applied.  The
patient tolerated the procedure well without immediate post
procedural complication.
IMPRESSION: Successful placement of 19 cm tip to cuff tunneled hemodialysis
catheter via the right internal jugular vein with tips terminating
within the superior aspect of the right atrium.  The catheter is
ready for immediate use.

## 2014-05-18 NOTE — Discharge Summary (Signed)
PATIENT NAME:  [name], [name] MR#:  [id number] DATE OF BIRTH:  09-07-1955  DATE OF ADMISSION:  10/23/2011 DATE OF DISCHARGE:  10/27/2011  PRIMARY CARE PHYSICIAN: Dr. Brunetta Genera    DISCHARGE DIAGNOSES:  1. Epilepsy with uncontrolled seizures likely secondary from left temporal craniotomy and encephalomalacia in the past.  2. Hypertensive emergency.  3. End-stage renal disease, on hemodialysis.  4. Mild hypokalemia.   CONSULTS:  1. Dr. Candiss Norse of Nephrology  2. Dr. Manuella Ghazi of Neurology    IMAGING STUDIES:  1. CT scan of the head with contrast showed chronic changes in the left temporal lobe consistent with previous craniotomy. No acute changes found.  2. Chest x-ray showed no acute abnormalities. Bibasilar atelectasis.   ADMITTING HISTORY AND PHYSICAL: Please see detailed history and physical dictated on 10/23/2011. In brief, this is a 59 year old African American male patient with history of end-stage renal disease, hypertension, and seizures who presented to the Emergency Room with generalized tonic-clonic seizures lasting a total of one hour. The patient received Ativan in the Emergency Room and was admitted to the hospitalist service to the CCU with status epilepticus and hypertensive emergency.   HOSPITAL COURSE:  1. Epilepsy. The patient was seen by Dr. Manuella Ghazi of Neurology who suggested continuing Rio at 750 mg oral once a day with one extra dose after each dialysis. He was also continued on phenytoin 100 mg extended-release twice a day. The patient did not have any further seizures in the hospital, feels well on the day of discharge and is being discharged home. The patient was offered to go to skilled nursing facility but the patient has adamantly refused multiple times. He wanted to go home and follow-up with his Education officer, museum at home.  2. Hypertensive emergency. The patient's home medications were continued. IV medications were used and hypertension was better controlled. The patient has  had persistent hypertension for many years and follows up with his doctor for the same.   On the day of discharge, the patient's temperature is 98, pulse 84, blood pressure 158/89, saturating 99% on room air and is being discharged home in fair condition.   NOTE: Again, the patient was thought to be a candidate for skilled nursing facility but has adamantly refused it and wanted to go home.   DISCHARGE MEDICATIONS:  1. Amlodipine 10 mg oral once a day.  2. Aranesp 1 mL injectable 2 times a week on dialysis.  3. Clonidine 0.1 mg oral 2 times a day.  4. Folic acid 1 mg oral once a day.  5. Acetaminophen/oxycodone 2 tablets oral as needed for pain.  6. Oxycodone 5 mg oral every six hours as needed for pain.  7. Vitamin B Complex 100 one tablet oral once a day.  8. Coreg 25 mg oral 2 times a day.  9. Escitalopram 20 mg oral once a day.  10. Hydralazine 50 mg oral 4 times a day.  11. Lisinopril 20 mg oral once a day.  12. Robafen 5 mL oral every six hours as needed for cough.  13. Phenytoin 100 mg extended-release 1 capsule oral 2 times a day.  14. Keppra 750 mg oral once a day and extra 1 tablet dose after dialysis.   DISCHARGE INSTRUCTIONS:  1. The patient is to follow-up with his Education officer, museum and also with Dr. Manuella Ghazi in 2 to 3 weeks for seizures.  2. The patient will be on a renal cardiac diet with activity as tolerated with assistance at all times.  This plan was discussed with the patient who has verbalized understanding and is okay with the plan.      TIME SPENT: Time spent today on this discharge dictation along with coordinating care and counseling of the patient was 40 minutes.   ____________________________ Leia Alf Shrita Thien, MD srs:drc D: 10/27/2011 09:49:35 ET T: 10/27/2011 16:22:59 ET JOB#: [id number]  cc: Alveta Heimlich R. Zeppelin Beckstrand, MD, <Dictator> Meindert A. Brunetta Genera, MD Hemang K. Manuella Ghazi, MD Neita Carp MD ELECTRONICALLY SIGNED 11/01/2011 11:19

## 2014-05-18 NOTE — Op Note (Signed)
PATIENT NAME:  [name], [name] MR#:  [id number] DATE OF BIRTH:  09/07/1955  DATE OF PROCEDURE:  12/05/2011  PREOPERATIVE DIAGNOSES:  1. End-stage renal disease.  2. Hypertension.   POSTOPERATIVE DIAGNOSES:  1. End-stage renal disease.  2. Hypertension.   PROCEDURE: Left loop forearm AV graft using 6 mm Propaten PTFE.   SURGEON: Algernon Huxley, MD  ANESTHESIA: General.   ESTIMATED BLOOD LOSS: Approximately 50 mL.   INDICATION FOR PROCEDURE: 59 year old African American male with end-stage renal disease. He has a Chief Technology Officer but he requires permanent dialysis access. He had no superficial veins on vein mapping for fistula creation and an AV graft is planned.   DESCRIPTION OF PROCEDURE: Patient is brought to the operative suite and after an adequate level of general anesthesia was obtained, left upper extremity is sterilely prepped and draped, a sterile surgical field was created. Made a transverse incision just below the antecubital fossa, dissected out the brachial artery and brachial vein at this location which appeared nice and usable for graft creation. The patient was systemically heparinized after I made a tunnel in the forearm for a loop forearm AV graft. A 6 mm Propaten standard graft was selected. It was kept with line upwards for orientation and a small counterincision was made in the forearm. Control was pulled up on the vessel loops on the artery and an anterior wall arteriotomy was created with an 11 blade and extended with Potts scissors. Anastomosis created with a running CV-6 suture in the usual fashion. There was excellent pulsatile blood flow through the AV graft. It was then clamped and pressure was held on the arterial anastomosis site to stop needle hole bleeding. The vein was then controlled with bulldog clamps. An anterior wall venotomy was created with an 11 blade and extended with Potts scissors and the graft was cut and beveled to an appropriate length to match the venotomy.  Another CV-6 suture was used to create the venous anastomosis. This was done in the usual fashion. The vessel was flushed. The graft was flushed prior to completion of the anastomosis. Upon completion there was significant needle hole bleeding that stopped after about five minutes of  local pressure. The wound was then was irrigated. Surgicel and Evicyl topical hemostatic agents were placed. There was a nice pulse within the graft and a thrill within the brachial vein above the graft. I then closed the wounds with a 3-0 Vicryl and a 4-0 Monocryl and Dermabond was placed as dressing. The patient tolerated procedure well and was taken to the recovery room in stable condition.  ____________________________ Algernon Huxley, MD jsd:cms D: 12/05/2011 16:54:13 ET T: 12/06/2011 10:19:10 ET JOB#: [id number]  cc: Algernon Huxley, MD, <Dictator> Algernon Huxley MD ELECTRONICALLY SIGNED 12/06/2011 18:12

## 2014-05-18 NOTE — Op Note (Signed)
PATIENT NAME:  [name], [name] MR#:  [id number] DATE OF BIRTH:  [birth year]  DATE OF PROCEDURE:  11/21/2011  PREOPERATIVE DIAGNOSES:  1. End-stage renal disease.  2. Nonfunctional right jugular PermCath.   POSTOPERATIVE DIAGNOSES:  1. End-stage renal disease.  2. Nonfunctional right jugular PermCath.   PROCEDURE: TPA infusion to right jugular PermCath.   SURGEON: Algernon Huxley, MD  ANESTHESIA: None.   ESTIMATED BLOOD LOSS: None.   INDICATION FOR PROCEDURE: 59 year old African American male who has end-stage renal disease. His PermCath is not flowing well and we asked to perform a TPA infusion to try to salvage this.   DESCRIPTION OF PROCEDURE: Patient was brought to vascular interventional radiology area. His existing catheter was sterilely hooked up to an infusion of TPA, 2 mg TPA infused through both ports over a two hour time span and then I aspirated easily and flushed both lumens with sterile saline and then dwelled a concentrated heparin solution. The appropriate distal connectors were placed.  ____________________________ Algernon Huxley, MD jsd:cms D: 11/22/2011 10:29:57 ET T: 11/22/2011 10:42:21 ET JOB#: [id number]  cc: Algernon Huxley, MD, <Dictator> Algernon Huxley MD ELECTRONICALLY SIGNED 11/25/2011 9:15

## 2014-05-18 NOTE — Op Note (Signed)
PATIENT NAME:  [name], DOWE MR#:  [id number] DATE OF BIRTH:  [birth year]  DATE OF PROCEDURE:  12/31/2011  PREOPERATIVE DIAGNOSES:  1. End-stage renal disease.  2. Functional permanent dialysis access with no longer useful right jugular PermCath.   PROCEDURE: Removal of right jugular PermCath.   SURGEON: Algernon Huxley, MD  ANESTHESIA: Local.   ESTIMATED BLOOD LOSS: Minimal.   INDICATION FOR PROCEDURE: 59 year old African American male with end-stage renal disease. His permanent access is now functional and his PermCath be removed.   DESCRIPTION OF PROCEDURE: The area was sterilely prepped and draped, a sterile surgical field was created. The area was anesthetized copiously with 1% Xylocaine. I then used hemostats to help dissect out the cuff and an 11 blade to cut the fibrous sheath connected to the cuff. The catheter was then removed in its entirety without difficulty and pressure was held and sterile dressing was placed. The patient tolerated procedure well.   ____________________________ Algernon Huxley, MD jsd:cms D: 12/31/2011 15:10:53 ET T: 12/31/2011 15:49:53 ET JOB#: [id number]  cc: Algernon Huxley, MD, <Dictator>  Algernon Huxley MD ELECTRONICALLY SIGNED 01/03/2012 17:01

## 2014-05-18 NOTE — Consult Note (Signed)
PATIENT NAME:  [name], [name] [name]#:  [id number] DATE OF BIRTH:  [birth year]  DATE OF CONSULTATION:  10/23/2011  REFERRING PHYSICIAN:  Hillary Bow, MD CONSULTING PHYSICIAN:  Vedh Ptacek K. Manuella Ghazi, MD  REASON FOR CONSULTATION: Status epilepticus.   HISTORY OF PRESENT ILLNESS: [name] is a 59 year old African American gentleman with a history of end-stage renal disease on hemodialysis, was at his dialysis center and was found to have a seizure. It was noted to start in his right side of the body with right arm shaking followed by generalized tonic-clonic activity when the EMS arrived at. Supposedly he had a seizure for around 1 hour or so per report.   When he reached the ER he had a very elevated blood pressure which came down significantly when seizures stopped.   There was a concern for subclinical seizure as the patient had right gaze deviation.   Patient seems to have a history of epilepsy, status post epilepsy surgery with a left temporal lobectomy.   His craniotomy scar looks much anterior than typical for temporal lobectomy.   The patient did not have seizure in the last 4 years per report.   He was taking levetiracetam/Keppra 500 mg twice a day.   PAST MEDICAL HISTORY: Significant for epilepsy, status post epilepsy surgery, history of stroke, hypertension, gout, end-stage renal disease on hemodialysis.   PAST SURGICAL HISTORY: Significant for cardiac catheterization and left temporal lobectomy.   ALLERGIES: He has no known drug allergies.   FAMILY HISTORY: Unknown.   SOCIAL HISTORY: Unknown.  I reviewed his home medication list.   REVIEW OF SYSTEMS: Unobtainable due to being unresponsive.   PHYSICAL EXAMINATION:  VITAL SIGNS: His temperature is 97.7, pulse 76, respiratory rate 16, blood pressure 163/94, and pulse oximetry 100% on 2L of oxygen.   GENERAL: He is a bald, African American gentleman lying in ICU bed, not in acute distress, sleeping. He was arousable to physical  stimulation. He opened his eyes briefly. He might have left gaze deviation with some nystagmus. He did follow 1-step commands with opening his mouth, but he did not follow any other commands.   LUNGS: Clear to auscultation.   HEART: S1, S2 heart sounds. Carotid exam did not reveal any bruit.   EENT: His pupils were reactive. He does have some cataract. He does have some reddish discoloration of his sclerae:, otherwise, his face was symmetric. Cannot check his tongue or hearing.   EXTREMITIES: He has significantly increased tone in his bilateral upper and lower extremities but he did withdraw to pain. His deep tendon reflexes are increased.   REVIEW OF RADIOLOGICAL DATA: On his MRI of the brain he does have encephalomalacia in his left temporal lobe as well as he has the signs of craniotomy in his left frontal region. The patient also has other significant white matter microvascular ischemic changes.   ASSESSMENT AND PLAN:  1. Status epilepticus. This was an urgent consult. I talked to Dr. Darvin Neighbours me on the telephone, and we decided to load him with fosphenytoin 20 mg/kg. He was given a 1000 mg before, and we added another 500 mg. Patient should be followed by Pharmacy to make sure he gets adequate dosing of fosphenytoin, and his liver stays on the high 20 side. Patient will be continued on levetiracetam mg p.o. b.i.d.. Once we obtain EEG and rule out subclinical seizures, his levetiracetam can be converted to the typical hemodialysis dose of 750 mg p.o. daily followed by extra supplemental dose after his dialysis.  The patient should have EEG as above.  2. Patient has a history of epilepsy per report, and he is status post epilepsy surgery of left temporal lobectomy. He is craniotomy seems to be much anterior than typically seen for temporal lobectomy. I presume that this was done during his epilepsy surgery planning when he probably had a temporal grid as well as frontal subdural grid electrode  placement for the epileptic focus localization. But this is just my assumption; I would like to his review his data if available.  3. The patient has a history of stroke but that data is not available. I would like to talk to the family members and see if they can provide further data.  4. The patient's blood pressure has reduced, and autonomic changes commonly seen in patients with active seizure or during the seizure.   5. I will follow this patient with you. Hemodialysis etc. per hospitalist physician. Feel free to contact me with any further questions.   I spent 40 min of critical care time for this patient who is critically ill and unstbale, presented with status epilepticus, who is at high risk of death or serious brain injury due to his uncontrolled seizures.   ____________________________ Royetta Crochet. Manuella Ghazi, MD hks:vtd D: 10/23/2011 21:09:35 ET T: 10/24/2011 11:29:26 ET JOB#: [id number]  cc: Shaela Boer K. Manuella Ghazi, MD, <Dictator> Meindert A. Brunetta Genera, MD [facility] MD ELECTRONICALLY SIGNED 10/25/2011 6:35

## 2014-05-18 NOTE — Consult Note (Signed)
Brief Consult Note: Diagnosis: status epilepticus, h/o epilepsy s/p epilepsy surgery.   Patient was seen by consultant.   Consult note dictated.   Comments: - pt had seizure lasting more than 1 hr and than noted to have right gaze deviation (h/o left temporal lobectomy - left onset seizure?) - he was on levetiracetam 500 bid - increased to 750 bid (will need to convert do dialysis dose once seizure controlled - levetiracetam 750 mg po q day and additional dose 750 mg after each dialysis) - agree with fosphenytoin load (20 mg/kg) loading followed by fosphenytoin 100 mg iv q 8 hrs with pharmacy dosing - EEG 2) H/o epilepsy s/o left temporal lobectomy, his craniotomy is more anterior than typical for temporal lobectomy - likely because he had subdural grid implant during epilepsy surgery planning requiring frontal leads placement. Per family report - no seizure in last 4 years.  Electronic Signatures: Ray Church (MD)  (Signed 24-Sep-13 20:57)  Authored: Brief Consult Note   Last Updated: 24-Sep-13 20:57 by Ray Church (MD)

## 2014-05-18 NOTE — H&P (Signed)
PATIENT NAME:  [name], [name] MR#:  [id number] DATE OF BIRTH:  [birth year]  DATE OF ADMISSION:  10/23/2011  PRIMARY CARE PHYSICIAN: Dr. Brunetta Genera    History obtained from old records and nursing staff. This case was discussed with ER physician, Dr. Darl Householder, and Dr. Manuella Ghazi of Neurology. Imaging studies and EKG personally reviewed.   HISTORY OF PRESENTING ILLNESS: The patient is a 59 year old African American male patient with history of end-stage renal disease on dialysis, hypertension, and prior craniotomy who is on Keppra at home who presents to the Emergency Room sent from dialysis center after the patient was found to have generalized tonic-clonic seizure. This lasted a total of one hour and presently the patient has received 4 mg of Ativan and is in a postictal state without any seizures. The patient did not have any incontinence or vomiting. His blood pressure was elevated at 210/123 at dialysis center.   Presently the patient opens his eyes but does not talk. There was some right-sided deviation of his eyes and stroke was considered but secondary to seizures TPA was not given as discussed with Dr. Darl Householder of the ER.   The patient has been loaded with 1000 mg of fosphenytoin at this time in the Emergency Room.   PAST MEDICAL HISTORY: 1. Left temporal craniotomy.  2. Cerebrovascular accident, ischemic versus hemorrhagic.  3. Hypertension.  4. Gout.  5. End-stage renal disease, on hemodialysis.   PAST SURGICAL HISTORY: Cardiac catheterization in 2002, negative for CAD.   ALLERGIES: No known drug allergies.   FAMILY HISTORY: Unknown.   SOCIAL HISTORY: Unknown at this time.   HOME MEDICATIONS:  1. Amlodipine 10 mg oral once a day.  2. Aranesp once a week at dialysis.  3. Coreg 25 mg oral twice a day.  4. Clonidine 0.1 mg oral 2 times a day.  5. Escitalopram 20 mg oral once a day.  6. Folic acid 1 mg oral once a day.  7. Hydralazine 50 mg oral 4 times a day.  8. Keppra 500 mg oral 2 times a  day.  9. Lisinopril 20 mg oral once a day.  10. Acetaminophen/oxycodone 5/325 two tablets as needed for pain.  11. Oxycodone 5 mg oral every six hours as needed for pain.  12. Phenytoin 100 mg oral capsule 3 times a day.  13. Vitamin B complex 1 tablet oral once a day.   REVIEW OF SYSTEMS: Unobtainable as the patient is in postictal state and is nonverbal.   PHYSICAL EXAMINATION:   VITAL SIGNS: Temperature 97.7, pulse 114, blood pressure 143/100, was 210/123 at dialysis center, saturating 100% on 2 liters oxygen.   GENERAL: Moderately built [place] American male patient lying in bed, drowsy, opens eyes to pain and withdraws.   HEENT: Atraumatic, normocephalic. No tongue bite noticed. No blood around his mouth. Pallor positive. No icterus. Pupils bilaterally equal and reactive to light.   NECK: Supple. No thyromegaly. No palpable lymph nodes. Trachea midline. No carotid bruit, JVD.   CARDIOVASCULAR: S1, S2, tachycardic without any murmurs. Peripheral pulses 2+. No edema.   RESPIRATORY: Has coarse breath sounds on both sides.   GASTROINTESTINAL: Soft abdomen, nontender. Bowel sounds present.   SKIN: Warm and dry. No petechiae, rash, or ulcers.   NEUROLOGIC: Unable to assess for motor function. Reflexes show 2+ reflexes in bilateral lower extremities, 3+ in right upper extremity and 2+ in left upper extremity. Withdraws all extremities to pain. Flaccid right upper extremity.   LABORATORY, DIAGNOSTIC, AND RADIOLOGICAL DATA: BUN  12, creatinine 2.59, sodium 134, potassium 3.3, WBC 9.9, hemoglobin 13.2, platelets 148. INR 1. APTT 29.5.   CT scan of the head shows left temporal area craniotomy, subtle decreased density in the posterior parietal deep white matter on the right consistent with chronic small vessel ischemia. No acute intracranial hemorrhage or mass.   EKG shows sinus tachycardia.   ASSESSMENT AND PLAN:  1. Status epilepticus status post Ativan. Presently the patient seems to  be seizure-free, although nonconvulsive status is another concern but the patient seems to be opening his eyes. Discussed case with Dr. Manuella Ghazi who recommended loading the patient with an extra 500 fosphenytoin over the 1000. We will continue the IV after that. Will also switch his Keppra 500 p.o. to 750 IV twice a day at this time. Consult Dr. Manuella Ghazi who will see the patient. Will get an EEG. The patient will be admitted to CCU. Will use Ativan p.r.n. if any further seizures. Will also get an MRI of the brain with and without contrast for seizures and look for any new CVA the patient might have. In reviewing old records the patient seemed to have some left-sided edema in his temporoparietal area, was transferred to a tertiary care center but documents and records unavailable at this time.  2. Hypertensive emergency with blood pressure 210/123 at dialysis center, much improved at this time. This could be the cause for seizure. Will closely monitor and use IV p.r.n. medications.  3. End-stage renal disease, on hemodialysis. Consult Nephrology.  4. Mild hypokalemia. Will replace IV.  5. DVT prophylaxis. SCDs and TEDs. Will also add heparin.   CODE STATUS: Presumed FULL CODE.      TIME SPENT: Time spent today on this case and coordination of care including reviewing old records, discussing with ER and Neurology, Dr. Manuella Ghazi, and coordinating care was more than 75 minutes with more than 50% time spent in coordination of care.   ____________________________ Leia Alf. Charod Slawinski, MD srs:drc D: 10/23/2011 14:58:13 ET T: 10/23/2011 15:41:47 ET JOB#: [id number]  cc: Alveta Heimlich R. Montavius Subramaniam, MD, <Dictator> Meindert A. Brunetta Genera, MD Hemang K. Manuella Ghazi, MD Neita Carp MD ELECTRONICALLY SIGNED 10/23/2011 16:19

## 2014-05-21 NOTE — Consult Note (Signed)
Chief Complaint:  Subjective/Chief Complaint Overall better with less diarrhea.   VITAL SIGNS/ANCILLARY NOTES: **Vital Signs.:   03-Mar-14 10:13  Vital Signs Type Routine  Temperature Temperature (F) 97.8  Celsius 36.5  Temperature Source oral  Pulse Pulse 79  Respirations Respirations 20  Systolic BP Systolic BP 161  Diastolic BP (mmHg) Diastolic BP (mmHg) 85  Mean BP 98  Pulse Ox % Pulse Ox % 90  Pulse Ox Activity Level  At rest  Oxygen Delivery Room Air/ 21 %   Lab Results: Routine Chem:  03-Mar-14 05:13   Glucose, Serum 72  BUN  26  Creatinine (comp)  7.95  Sodium, Serum  129  Potassium, Serum  3.1  Chloride, Serum  94  CO2, Serum 28  Calcium (Total), Serum  7.2  Anion Gap 7  Osmolality (calc) 262  eGFR (African American)  8  eGFR (Non-African American)  7 (eGFR values <5m/min/1.73 m2 may be an indication of chronic kidney disease (CKD). Calculated eGFR is useful in patients with stable renal function. The eGFR calculation will not be reliable in acutely ill patients when serum creatinine is changing rapidly. It is not useful in  patients on dialysis. The eGFR calculation may not be applicable to patients at the low and high extremes of body sizes, pregnant women, and vegetarians.)   Assessment/Plan:  Assessment/Plan:  Assessment Campylobacter diarrgea. Resolving slowy on Azithromycin and Imodium.   Plan Agree with discharge. Follow up with Dr. BClayborn Bigness Thanks.   Electronic Signatures: IJill Side(MD)  (Signed 03-Mar-14 12:03)  Authored: Chief Complaint, VITAL SIGNS/ANCILLARY NOTES, Lab Results, Assessment/Plan   Last Updated: 03-Mar-14 12:03 by IJill Side(MD)

## 2014-05-21 NOTE — Consult Note (Signed)
Brief Consult Note: Comments: Psychiatry: Came by today to see patient for consult. Patient is off the floor, presumably at dialysis. Will continue to try to follow up with patient when possible.  Electronic Signatures: Gonzella Lex (MD)  (Signed 01-Mar-14 13:40)  Authored: Brief Consult Note   Last Updated: 01-Mar-14 13:40 by Gonzella Lex (MD)

## 2014-05-21 NOTE — Consult Note (Signed)
PATIENT NAME:  [name], BEFORT MR#:  [id number] DATE OF BIRTH:  [birth year]  DATE OF CONSULTATION:  02/24/2012  REFERRING PHYSICIAN:   CONSULTING PHYSICIAN:  Jerrol Banana. Burt Knack, MD  CHIEF COMPLAINT: Abnormal gallbladder ultrasound.   HISTORY OF PRESENT ILLNESS: This is a 59 year old black male patient with a history of multiple medical problems and end-stage renal disease on dialysis. He has been on dialysis approximately 6 months and stopped drinking about 6 months ago when he was diagnosed with his end-stage renal disease. He was a very heavy drinker, essentially drinking anything he could get his hands on, using is terminology.   I was asked to see the patient because of an abnormal ultrasound suggesting possible acute cholecystitis with thickened gallbladder wall. The patient describes absolutely no abdominal pain. He has no right upper quadrant pain, no epigastric pain, is able to eat whatever he wants. He was admitted because of weakness and dizziness and diarrhea and had missed dialysis treatment, I believe.   PAST MEDICAL HISTORY:  Hypertension, end-stage renal disease, history of stroke and history of seizure disorder.   PAST SURGICAL HISTORY:  Craniotomy and left brachial AV fistula.   ALLERGIES: None.   MEDICATIONS: Multiple, see chart.   FAMILY HISTORY:  Noncontributory with the exception of the fact that his mother died of renal failure at a young age.  REVIEW OF SYSTEMS:  A 10-system review is performed and negative with the exception of that mentioned in the history of present illness.   SOCIAL HISTORY: The patient is unemployed. He smokes tobacco. Stop drinking alcohol 6 months ago but was a very heavy drinker.   PHYSICAL EXAMINATION: GENERAL: Chronically ill-appearing black male who patient appears comfortable.  VITAL SIGNS: Temperature 98.4, pulse of 73, respirations 18, blood pressure 134/75, 100% room air sat.  HEENT: No scleral icterus.  NECK: No palpable neck nodes.   CHEST: Clear to auscultation.  CARDIAC: Regular rate and rhythm.  ABDOMEN: Soft, nondistended, nontender. There is a small umbilical hernia present.  EXTREMITIES:  Are without edema.  The left forearm has a proximal AV fistula with a good thrill and bruit.  NEUROLOGIC: Grossly intact.  INTEGUMENT: No jaundice.   LABORATORY AND DIAGNOSTIC DATA:  Show multiple 38 arrangements including a creatinine of 13.5, BUN 139, potassium 2.7, CO2 14, alkaline phosphatase 134, bilirubin of 2.2, AST and ALT of 31 and 38 and an albumin of 2.2. White blood cell count 8.9, H and H is 7 and 20, and a platelet count of 50.   An ultrasound shows ascites. No gallstones present.  Thickening of the gallbladder wall but no sonographic Murphy's sign.   ASSESSMENT AND PLAN: This is a patient with no clinical evidence of acute cholecystitis. He has no gallstones on his ultrasound and only thickened gallbladder wall on ultrasound. He has no abdominal pain whatsoever, has never had right upper quadrant pain and even if he did, he is not a surgical candidate for gallbladder surgery because of his sequela of chronic alcoholism, namely thrombocytopenia and likely portal hypertension and ascites.  Being that this patient is not a surgical candidate and has no symptoms of gallbladder disease, there is no medical necessity for the surgical service to continue following this patient. We will sign off after this consult but will be happy to see the patient again should he ever develop symptoms.      ____________________________ Jerrol Banana Burt Knack, MD rec:ct D: 02/24/2012 09:32:01 ET T: 02/24/2012 11:26:14 ET JOB#: [id number]  cc: Jerrol Banana.  Burt Knack, MD, <Dictator> Florene Glen MD ELECTRONICALLY SIGNED 02/24/2012 14:52

## 2014-05-21 NOTE — Discharge Summary (Signed)
PATIENT NAME:  [name], [name] MR#:  [id number] DATE OF BIRTH:  [birth year]  DATE OF ADMISSION:  03/24/2012 DATE OF DISCHARGE:  03/31/2012  ADDENDUM.  THIS IS A CONTINUATION.  As mentioned above, the patient's magnesium and potassium level has been improving.  End-stage renal disease: The patient has been treated with hemodialysis.  Anemia:  The patient has anemia of chronic disease. Hemoglobin decreased to 6.4 and was treated with PRBC transfusion 1 unit and increased to 7.7.  Thrombocytopenia: The patient's platelets decreased to 25, and he was treated with 1 unit platelet transfusion by Dr. Ma Hillock, and it increased to 35.0. Dr. Ma Hillock suggested to monitor platelet count upon outpatient visit and transfuse if it drops less than 20,000 or at higher count if major bleeding issues. He suggested no need for continued Hematology follow-up.   Since the patient has aggressive behavior, Dr. Weber Cooks, Behavioral Medicine physician, evaluated the patient and suggested Abilify 2 mg p.o. daily. In addition, the nephrologist, Dr. Juleen China, discontinued Keppra due to sed effect. He suggested continue Dilantin.   The patient's symptoms have much improved. He is clinically stable.  He is discharged to home today. I discussed the patient's discharge plan with the patient and case manager.   TIME SPENT: About 45 minutes.   ____________________________ Demetrios Loll, MD qc:cb D: 03/31/2012 17:24:39 ET T: 04/01/2012 09:36:26 ET JOB#: [id number]  cc: Demetrios Loll, MD, <Dictator> Demetrios Loll MD ELECTRONICALLY SIGNED 04/02/2012 14:35

## 2014-05-21 NOTE — Consult Note (Signed)
HEMATOLOGY followup - sitting in bed. States he is going home today.no fever. Denies bleeding issuesNAD          vitals - 97.8, 79, 20, 124/85          lungs - b/l good air entry          abd - soft, no hepatosplenomegaly          ext - edema. No major bruising or ecchymosis Hb 7.7, WBC 5200, ANC 2800, platelets 35K. Recent INR 1.2, PTT 41.2, fibrinogen 234, FDP 10-40. Ab-retic 0.03. HCV Ab positive. Otherwise HBsAg,ANA,ANCA,SPEP, folate, Coombs test, serum Flow Cytometry, HIT study, Cold agglutinin titer are all unremarkable.  CT abd/pelvis. IMPRESSION  1. Cirrhotic liver. Large amount of ascites. 2. Normal size spleen.  Impression/Recommendations:gentleman with known history of end-stage renal disease on hemodialysis, admitted to hospital because of persistent diarrhea with stool culture positive for Campylobacter antigen and he is on IV ciprofloxacin, progressive thrombocytopenia and anemia. Anemia likely from underlying ESRD (anemia of chronic disease) alongwith HCV infection. Etiology for thrombocytopenia is likely HCV infection alongwith liver cirrhosis seen on CT scan. Remaining workup is unremarkable to suggest underlying hematological disorder. Patient does not have any obvious bleeding symptoms at this time. He is being discharged home today.  Recommend monitoring platelet count upon outpatient visits and transfuse if it drops < 20K (or at higher counts if major bleeding issues). Do not see the need for continued hematology followup and will sign off of the case. Please re-consult hematology if needed in the future. Patient explained above, he is agreeable to this plan.  Electronic Signatures: Jonn Shingles (MD)  (Signed on 03-Mar-14 15:51)  Authored  Last Updated: 03-Mar-14 15:51 by Jonn Shingles (MD)

## 2014-05-21 NOTE — Consult Note (Signed)
PATIENT NAME:  [name], [name] MR#:  [id number] DATE OF BIRTH:  July 13, 1955  DATE OF CONSULTATION:  02/23/2012  REFERRING PHYSICIAN:   CONSULTING PHYSICIAN:  Manya Silvas, MD  The patient is a 59 year old black male with a history of multiple medical problems including end-stage renal disease, on dialysis, hypertension, gout, previous stroke 6 years ago and seizure disorder.   He has been having problems with dizziness, weakness and diarrhea for the past week. The diarrhea is described as black and watery. It started a week ago. He has a small squirt to large volume bowel movements. He goes about every 30 minutes to an hour at times.   He denies any abdominal pain with this. There is no vomiting. He has never had an upper endoscopy or colonoscopy. No fevers. He does not take Pepto-Bismol. There is no dysphagia. He has been feeling bad from the diarrhea, but he is able to walk to the bathroom.   The patient felt kind of dizzy and lazy and missed dialysis for the last week. He was noted to have bloody stool in the ER and stool occult blood positive. For all this, I was asked to see him.   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 He has been on dialysis for about 5 or 6 months now.   HABITS: He used to drink a couple of 40 ounce beers a day, quit 6 months ago when he went on dialysis. He smokes 1 or 2 cigarettes a day. 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 He has not driven in probably 10 years.   ALLERGIES: None.   MEDICATIONS: Vitamin B1 100 mg a day, phenytoin 100 mg every 12 hours, Solu-Medrol 125 mg every 12 hours, I am wondering about that dose, Norco 5/325, 1 or 2 tablets every 6 hours p.r.n. for pain, Lopressor 25 mg a day, Losartan 100 mg a day, lisinopril 20 mg a day, Lexapro 20 mg a day, Keppra 500 mg p.o. b.i.d., folic acid 1 mg a day, multi- vitamin one a day, Coreg 25 mg b.i.d. and Norvasc 10 mg  a day.   REVIEW OF SYSTEMS: No chills or fever. No blurred vision. No chest pains or skipping heartbeats. No coughing or wheezing. No significant abdominal pain. No dysuria, hematuria, or incontinence. No skin rash.   PHYSICAL EXAMINATION:  GENERAL: Black male in no acute distress. Appropriate historian.  VITAL SIGNS: Temperature 98.4, pulse 73, respirations 18, blood pressure 134/75, pulse ox 100%.  HEENT: Sclerae anicteric. Conjunctivae questionably slightly yellow. Tongue negative. Head is atraumatic.  NECK: No carotid bruits.  CHEST: Clear.  HEART: Shows a 1 to 2/6 short systolic murmur.  ABDOMEN: Nontender. Questionable palpable spleen. No other masses noted.  EXTREMITIES: No edema.  RECTAL: Not done at this time. It was heme-positive in the ER.  Ultrasound of the abdomen shows a thickened gallbladder wall without a definite sonographic Murphy's sign. Gallbladder wall is 5.4 mm in size. Small right pleural effusion noted.   ASSESSMENT: Black bloody diarrhea for a week. This could be due to gastritis, duodenitis, ulceration; the black certainly indicating probable upper gastrointestinal origin. He was started on Flagyl. Stool for comprehensive culture, Clostridium difficile and ova and parasites have been ordered. Once these are obtained, I would go ahead and cover him with Cipro in addition to the Flagyl. I would probably continue the Protonix intravenous. When his blood chemistry,  electrolytes, etc. are better, he needs to have an upper endoscopy. Because of his abnormal ultrasound showing a very thick gallbladder wall, he needs to have a HID, possibly with CCK, although he is not tender at this time and is not complaining of significant abdominal pain. I will follow with you.     ____________________________ Manya Silvas, MD rte:aw D: 02/24/2012 09:07:27 ET T: 02/24/2012 10:02:26 ET JOB#: [id number]  cc: Manya Silvas, MD, <Dictator> Meindert A. Brunetta Genera, MD Manya Silvas  MD ELECTRONICALLY SIGNED 03/20/2012 7:54

## 2014-05-21 NOTE — Consult Note (Signed)
Brief Consult Note: Diagnosis: Persistant campylobacter diarrhea.   Patient was seen by consultant.   Comments: Persistant diarrhea with positive Campylobacter. C. diff negative. THrombocytopenia and anemia and ? h/o liver disease.  Recommendations: ID consult. Will emperically treat with quinolone. Will review records. Further recommendations to follow.  Electronic Signatures: Jill Side (MD)  (Signed 25-Feb-14 17:07)  Authored: Brief Consult Note   Last Updated: 25-Feb-14 17:07 by Jill Side (MD)

## 2014-05-21 NOTE — Consult Note (Signed)
Chief Complaint:  Subjective/Chief Complaint seen for diarrheal illness.  patient feeling some better, no n/v, loose stool early this am.   VITAL SIGNS/ANCILLARY NOTES: **Vital Signs.:   02-Mar-14 04:38  Vital Signs Type Routine  Temperature Temperature (F) 97.9  Celsius 36.6  Temperature Source oral  Pulse Pulse 73  Respirations Respirations 20  Systolic BP Systolic BP 478  Diastolic BP (mmHg) Diastolic BP (mmHg) 88  Mean BP 106  Pulse Ox % Pulse Ox % 91  Pulse Ox Activity Level  At rest  Oxygen Delivery Room Air/ 21 %  *Intake and Output.:   02-Mar-14 00:20  Stool  large loose stool    04:38  Stool  small loose stool    05:00  Stool  small loose stool    05:59  Stool  medium loose stool   Brief Assessment:  Cardiac Regular   Respiratory clear BS   Gastrointestinal details normal Nontender  Bowel sounds normal  mild generalized distension,   Lab Results: Routine Chem:  02-Mar-14 03:43   Result Comment LABS - This specimen was collected through an   - indwelling catheter or arterial line.  - A minimum of 67mls of blood was wasted prior    - to collecting the Dalynn Jhaveri.  Interpret  - results with caution.  Result(s) reported on 30 Mar 2012 at 04:39AM.  Magnesium, Serum  1.6 (1.8-2.4 THERAPEUTIC RANGE: 4-7 mg/dL TOXIC: > 10 mg/dL  -----------------------)  Routine Hem:  02-Mar-14 03:43   WBC (CBC) 5.2  RBC (CBC)  2.29  Hemoglobin (CBC)  7.7  Hematocrit (CBC)  22.4  Platelet Count (CBC)  35  MCV 98  MCH 33.5  MCHC 34.2  RDW  18.1  Neutrophil % 54.2  Lymphocyte % 22.3  Monocyte % 21.3  Eosinophil % 1.3  Basophil % 0.9  Neutrophil # 2.8  Lymphocyte # 1.2  Monocyte #  1.1  Eosinophil # 0.1  Basophil # 0.0   Radiology Results: CT:    26-Feb-14 09:06, CT Abdomen and Pelvis Without Contrast  CT Abdomen and Pelvis Without Contrast   REASON FOR EXAM:    (1) Thrombocytopenia, anemia, evaluate for   hepatosplenomegaly and lymphadenopath  COMMENTS:        PROCEDURE: CT  - CT ABDOMEN AND PELVIS W0  - Mar 26 2012  9:06AM     RESULT: Indication: Thrombocytopenia    Comparison: None    Technique: Multiple axial images from the lung bases to the symphysis   pubis were obtained without oral and without intravenous contrast.    Findings:  There is a small right pleural effusion. There is bibasilar airspace   disease likely representing atelectasis.    There is a nonobstructing right renal calculus. The left kidney is   unremarkable. No perinephric stranding is seen. The kidneys are symmetric   in size without evidence for exophytic mass. The bladder is unremarkable.    There is a micronodular contour of the liver concerning for cirrhosis.   There is a large amount of ascites. The gallbladder is unremarkable. The   spleen demonstrates no focal abnormality. The adrenal glands and pancreas   are normal.     The unopacified stomach,duodenum, small intestine, and large intestine   are unremarkable, but evaluation is limited by lack of oral contrast.    There is no pneumoperitoneum, pneumatosis, or portal venous gas. There is     no abdominal or pelvic free fluid. There is no lymphadenopathy.     The  abdominal aorta is normal in caliber with atherosclerosis.    The osseous structures are unremarkable.    IMPRESSION:     1. Cirrhotic liver. Large amount of ascites.  2. Normal size spleen.    Dictation Site: 1        Verified By: Jennette Banker, M.D., MD   Assessment/Plan:  Assessment/Plan:  Assessment 1) campylobacter diarrhea-slow improvement on abx/zithromycin 2) cirrhosis by CT, positive HCV abs.  multiple labs pending.   Plan 1) awaiting labs, as above.  Dr Dionne Milo to return tomorrow.   Electronic Signatures: Loistine Simas (MD)  (Signed 02-Mar-14 13:34)  Authored: Chief Complaint, VITAL SIGNS/ANCILLARY NOTES, Brief Assessment, Lab Results, Radiology Results, Assessment/Plan   Last Updated: 02-Mar-14 13:34 by  Loistine Simas (MD)

## 2014-05-21 NOTE — Discharge Summary (Signed)
PATIENT NAME:  [name], [name] MR#:  [id number] DATE OF BIRTH:  12/15/1955  DATE OF ADMISSION:  03/24/2012  DATE OF DISCHARGE:  03/31/2012  ADDENDUM: This is a continuation.  Since the patient's stool C. diff test was negative, Dr. Clayborn Bigness suggested discontinuing the Cipro, and start Zithromaxand imodium. After the treatment, the patient's symptoms have much improved. He has had no diarrhea for the past 3 days. He denies any abdominal pain, nausea, vomiting or diarrhea, and Dr. Clayborn Bigness suggested to continue Zithromax for 5 to 7 days. Initially, Dr. Dionne Milo planned to do sigmoidoscopy due to diarrhea, but since the patient's diarrhea has improved, Dr. Dionne Milo suggested no indication for sigmoidoscopy. He suggested the patient follow up with Dr. Clayborn Bigness as an outpatient.  Hypokalemia, hypomagnesemia. The patient has a severe low potassium and magnesium, which is possibly due to diarrhea. The patient has been treated with potassium and magnesium supplements.  Potassium  and magnesium levels have been improving. The last potassium is 3.1, and magnesium level is 1.6. The patient has been treated  Dictation ends here.   Please see addendum for continuation of dictation.     ____________________________ Demetrios Loll, MD qc:dmm D: 03/31/2012 17:19:00 ET T: 04/01/2012 10:11:41 ET JOB#: [id number]  cc: Demetrios Loll, MD, <Dictator> Demetrios Loll MD ELECTRONICALLY SIGNED 04/02/2012 14:52

## 2014-05-21 NOTE — Consult Note (Signed)
HEMATOLOGY followup note - resting, still weak. Diarrhea.no fever. No major bleeding issuesNAD          vitals - 98.8, 80, 20, 107/69          lungs - b/l good air entry          abd - soft, no hepatosplenomegaly          ext - edema. No major bruising or ecchymosis Hb 7.7, WBC 5000, ANC 3900, platelets 43K. INR 1.2, PTT 41.2, fibrinogen 234, FDP 10-40. Ab-retic 0.03. HCV Ab positive. OtherwiseHBsAg,ANA,ANCA,SPEP, folate, Coombs test all unremarkable.  CT abd/pelvis today. IMPRESSION  1. Cirrhotic liver. Large amount of ascites. 2. Normal size spleen.  Impression/Recommendations:gentleman with known history of end-stage renal disease on hemodialysis, admitted to hospital because of persistent diarrhea with stool culture positive for Campylobacter antigen and he is on IV ciprofloxacin, progressive thrombocytopenia and anemia. Anemia likely from underlying ESRD (anemia of chronic disease) alongwith HCV infection. Etiology for thrombocytopenia is likely HCV infection alongwith liver cirrhosis seen on CT scan. Will f/u other pending labs including heparin-induced platelet antibody, direct platelet antibody, serum flow cytometry. Patient does not have any obvious bleeding symptoms at this time.  Recommend monitoring platelet count and transfuse if it drops < 20K (or at higher counts if major bleeding issues). Do not see the need to pursue bone marrow biopsy evaluation.  Will continue to follow as indicated by lab results.   Electronic Signatures: Jonn Shingles (MD)  (Signed on 27-Feb-14 00:23)  Authored  Last Updated: 27-Feb-14 00:23 by Jonn Shingles (MD)

## 2014-05-21 NOTE — Op Note (Signed)
PATIENT NAME:  [name], [name] MR#:  [id number] DATE OF BIRTH:  [birth year]  DATE OF PROCEDURE:  03/26/2012  PREOPERATIVE DIAGNOSES:   1.  Infectious diarrhea.  2.  Hypokalemia.   POSTOPERATIVE DIAGNOSES:   1.  Infectious diarrhea.  2.  Hypokalemia.   PROCEDURES PERFORMED:   1.  Ultrasound guidance for vascular access to the right basilic vein.  2.  Insertion of a double lumen 5 French PICC, right arm.   SURGEON:  Katha Cabal, MD  INDICATIONS FOR PROCEDURE:   1.  Requiring IV antibiotics as well as multiple medications greater than 5 days.  2.  Limited or no vascular access.   DESCRIPTION OF PROCEDURE: The patient's right arm was sterilely prepped and draped, and a sterile surgical field was created. The basilic vein was accessed under direct ultrasound guidance without difficulty with a micropuncture needle and permanent image was recorded. 0.018 wire was then placed into the superior vena cava. Peel-away sheath was placed over the wire. A single lumen peripherally inserted central venous catheter was then placed over the wire and the wire and peel-away sheath were removed. The catheter tip was placed into the superior vena cava and was secured at the skin at 37 cm with a sterile dressing. The catheter withdrew blood well and flushed easily with heparinized saline. The patient tolerated procedure well.   ____________________________ Katha Cabal, MD ggs:si D: 03/26/2012 17:49:12 ET T: 03/26/2012 22:09:58 ET JOB#: [id number]  cc: Katha Cabal, MD, <Dictator> Katha Cabal MD ELECTRONICALLY SIGNED 04/29/2012 17:19

## 2014-05-21 NOTE — Consult Note (Signed)
Details:   - Psychiatry: Followup visit with this gentleman in the hospital for diarrhea with electrolyte imbalance and dialysis. Today he tells me that he is feeling a little bit better. He is not feeling as angry or irritable. He tells me that his diarrhea is getting better. He is physically feeling less sick. He is optimistic about being able to be discharged soon. Patient was awake and alert and cooperative. Made good eye contact. Normal psychomotor activity given his situation. Thoughts appear lucid no evidence of loosening of associations or delusions. Denied auditory or visual hallucinations. Denied suicidal or homicidal ideation. Patient was cooperative and appropriate in the interaction without any hostility or cursing. He was aware of his medication and asked appropriate questions. He was given his first dose of Abilify today although he was not aware of it. No evidence of side effects. He asked me to please change his Lexapro dose to the morning. I think that was when it was already given but just to be safe I went ahead and made it specifically for the morning. No further change to treatment. Psychoeducation done.   Electronic Signatures: Gonzella Lex (MD)  (Signed 02-Mar-14 17:57)  Authored: Details   Last Updated: 02-Mar-14 17:57 by Gonzella Lex (MD)

## 2014-05-21 NOTE — Consult Note (Signed)
CC: bloody diarrhea.  Pt remembers he ate chicken and left it out of the refrigerator overnight and ate it the next day because the house was cool and he thought it would be okay.  This is likely the source of his camplyobacter.  Electronic Signatures: Manya Silvas (MD)  (Signed on 26-Jan-14 17:50)  Authored  Last Updated: 26-Jan-14 17:50 by Manya Silvas (MD)

## 2014-05-21 NOTE — Consult Note (Signed)
Chief Complaint:  Subjective/Chief Complaint Diarrhea better. CT scan did not show any colonic abnormality.  Impression: Diarrhea, ? Campylobacter vs another etiology. CT is unremarkable. Cirrhosis of liver with ascites.  Recommendations: Agree with HIV testing. Will continue Cipro. Further recommendations to follow.   Electronic Signatures: Jill Side (MD)  (Signed 26-Feb-14 16:31)  Authored: Chief Complaint   Last Updated: 26-Feb-14 16:31 by Jill Side (MD)

## 2014-05-21 NOTE — Consult Note (Signed)
Even though he had abnormal HIDA he is not having pain suggestive of gall bladder and is a poor elective operative risk with end stage renal and cirrhosis.  Would give 14 days of antibiotics.  No further recommendations.  Electronic Signatures: Manya Silvas (MD)  (Signed on 27-Jan-14 18:32)  Authored  Last Updated: 27-Jan-14 18:32 by Manya Silvas (MD)

## 2014-05-21 NOTE — Consult Note (Signed)
PATIENT NAME:  [name], [name] MR#:  [id number] DATE OF BIRTH:  07/28/1955  DATE OF CONSULTATION:  03/26/2012  REFERRING PHYSICIAN:  Ceasar Lund. Anselm Jungling, MD CONSULTING PHYSICIAN:  Jill Side, MD  REASON FOR CONSULTATION: Persistent diarrhea.   HISTORY OF PRESENT ILLNESS: The patient is a 59 year old African American male who was initially admitted in January more than a month ago with diarrhea. The patient was found to have Campylobacter and was treated with, what I believe, quinolones. Apparently the patient was discharged a couple of weeks ago on Cipro. According to him, his diarrhea never got better. He has been running to the bathroom every half hour and came to the Emergency Room yesterday due to persistent diarrhea. Stool studies are again positive for Campylobacter.  They  negative for C. diff. The patient denies blood in the stool but sees occasional bright red blood after a bowel movement. He is complaining of some mild lower abdominal cramping. There is no nausea, vomiting, fever or chills.   PAST MEDICAL HISTORY: Significant for recent Campylobacter infection.  The patient also has history of chronic renal failure and is on dialysis. He has a history of seizure disorder.   ALLERGIES: None.   HOME MEDICATIONS: Keppra, clonidine, vitamin B1, amlodipine, folic acid, losartan, metoprolol, Dilantin and Renvela.  SOCIAL HISTORY: He does not smoke or drink.   FAMILY HISTORY: Unremarkable.   REVIEW OF SYSTEMS: Positive for persistent and severe diarrhea, lower abdominal cramping, feeling weak and dizzy, unable to complete his hemodialysis. No vomiting, hematemesis, fever or chills.   PHYSICAL EXAMINATION: GENERAL: Chronically ill-appearing male. He does not appear to be in any acute distress.  VITAL SIGNS: Temperature 99.1, pulse is 76, respirations 20, blood pressure 123/77.   HEENT: Unremarkable. He is not jaundiced. Clinically, he does appear to be pale and anemic.   LUNGS: Grossly clear to auscultation bilaterally with fair air entry. No added sounds.  CARDIOVASCULAR: Regular rate and rhythm. No gallops or murmur.  ABDOMEN: Slightly distended abdomen. Mild lower abdominal tenderness was noted. No rebound, no guarding. No hepatosplenomegaly. Bowel sounds appear to be fairly normal.  NEUROLOGIC: Appears to be intact.   LABORATORY AND RADIOLOGICAL DATA:  Serum potassium was low at 2.2 on admission. LDH is normal. The patient is acidotic with a serum CO2 of only 14.  Creatinine is 20. BUN is 99. Serum lipase is somewhat elevated at 631. It has been elevated in the past as well. Troponin is 0.06. Liver enzymes: Total bilirubin is 0.8, although in the recent past total bilirubin has been as high as 2.5. Alkaline phosphatase is 141, ALT 33, AST 51. These enzymes have been higher in the recent past. White cell count is 4.7, hemoglobin 6.4, platelet count of only 39.  The patient is showing gradually declining thrombocytopenia for the last several months.  Hemoglobin was fairly normal in September of last year. His stool studies again show negative C. diff and positive Campylobacter.   ASSESSMENT AND PLAN:  1. The patient is with persistent diarrhea for more than a month, two stool cultures: Initially, stool culture was positive for Campylobacter. The patient was treated with more than 2 weeks of Cipro.  He is now presenting with persistent diarrhea, and Campylobacter was again reported in the stool culture done 48 hours ago. C. diff is negative.  2. Worsening thrombocytopenia and anemia: This is probably secondary to underlying chronic liver disease and possibly cirrhosis as suggested by a recent ultrasound which showed abdominal ascites. The patient also  has history of liver disease in the past and at one point was suggested to have a liver biopsy by another gastroenterologist. Hepatitis C antibody was positive recently.  3. Chronic renal failure, on hemodialysis:  The  patient has missed his dialysis recently due to persistent diarrhea and appears to be acidotic and have severe electrolyte abnormalities.   RECOMMENDATIONS: I would restart him on Cipro. Erythromycin would be another option, but I will wait for ID consultation which has been placed.  Further recommendations per ID service. Once the patient is clinically improved, he probably should undergo a colonoscopy as well as an upper GI endoscopy for further assessment of anemia. As mentioned above, the    patient probably has cirrhosis of the liver secondary to underlying hepatitis C with signs of portal hypertension, and this will be managed conservatively. Further recommendations to follow.  I will follow.   ____________________________ Jill Side, MD si:cb D: 03/26/2012 09:42:13 ET T: 03/26/2012 10:05:39 ET JOB#: [id number]  cc: Jill Side, MD, <Dictator> Jill Side MD ELECTRONICALLY SIGNED 03/27/2012 12:15

## 2014-05-21 NOTE — Consult Note (Signed)
Chief Complaint:  Subjective/Chief Complaint seen for diarrheal illness. one small soft stool this am, denies n/v or abdominalpain.   VITAL SIGNS/ANCILLARY NOTES: **Vital Signs.:   01-Mar-14 10:03  Vital Signs Type Routine  Temperature Temperature (F) 98.5  Celsius 36.9  Pulse Pulse 74  Respirations Respirations 20  Systolic BP Systolic BP 578  Diastolic BP (mmHg) Diastolic BP (mmHg) 91  Mean BP 110  *Intake and Output.:   01-Mar-14 06:15  Stool  large, loose stool    08:04  Stool  1 small soft stool   Brief Assessment:  Cardiac Regular   Respiratory clear BS   Gastrointestinal details normal Soft  Nontender  No masses palpable  Bowel sounds normal  mild distension generalized   Lab Results: Hepatic:  01-Mar-14 04:25   Bilirubin, Total 0.8  Alkaline Phosphatase 90  SGPT (ALT) 16  SGOT (AST) 34  Total Protein, Serum  5.4  Albumin, Serum  1.6  Routine Chem:  01-Mar-14 04:25   Result Comment LABS - This specimen was collected through an   - indwelling catheter or arterial line.  - A minimum of 55ms of blood was wasted prior    - to collecting the sample.  Interpret  - results with caution.  Result(s) reported on 29 Mar 2012 at 04:48AM.  Glucose, Serum 80  BUN  42  Creatinine (comp)  10.64  Sodium, Serum  134  Potassium, Serum 3.6  Chloride, Serum 99  CO2, Serum 24  Calcium (Total), Serum  7.3  Osmolality (calc) 278  eGFR (African American)  6  eGFR (Non-African American)  5 (eGFR values <690mmin/1.73 m2 may be an indication of chronic kidney disease (CKD). Calculated eGFR is useful in patients with stable renal function. The eGFR calculation will not be reliable in acutely ill patients when serum creatinine is changing rapidly. It is not useful in  patients on dialysis. The eGFR calculation may not be applicable to patients at the low and high extremes of body sizes, pregnant women, and vegetarians.)  Anion Gap 11  Routine Coag:  28-Feb-14 09:31   INR  1.1 (INR reference interval applies to patients on anticoagulant therapy. A single INR therapeutic range for coumarins is not optimal for all indications; however, the suggested range for most indications is 2.0 - 3.0. Exceptions to the INR Reference Range may include: Prosthetic heart valves, acute myocardial infarction, prevention of myocardial infarction, and combinations of aspirin and anticoagulant. The need for a higher or lower target INR must be assessed individually. Reference: The Pharmacology and Management of the Vitamin K  antagonists: the seventh ACCP Conference on Antithrombotic and Thrombolytic Therapy. [id number].9528ept:126 (3suppl): [id number] HCT value >55% may artifactually increase the PT.  In one study,  the increase was an average of 25%. Reference:  "Effect on Routine and Special Coagulation Testing Values of Citrate Anticoagulant Adjustment in Patients with High HCT Values." American Journal of Clinical Pathology 2006;126:400-405.)  Routine Hem:  01-Mar-14 04:25   WBC (CBC) 4.6  RBC (CBC)  2.17  Hemoglobin (CBC)  7.3  Hematocrit (CBC)  21.2  Platelet Count (CBC)  36  MCV 98  MCH 33.6  MCHC 34.4  RDW  17.7  Neutrophil % 58.7  Lymphocyte % 23.6  Monocyte % 15.7  Eosinophil % 0.8  Basophil % 1.2  Neutrophil # 2.7  Lymphocyte # 1.1  Monocyte # 0.7  Eosinophil # 0.0  Basophil # 0.1   Radiology Results: CT:    26-Feb-14 09:06, CT Abdomen and  Pelvis Without Contrast  CT Abdomen and Pelvis Without Contrast   REASON FOR EXAM:    (1) Thrombocytopenia, anemia, evaluate for   hepatosplenomegaly and lymphadenopath  COMMENTS:       PROCEDURE: CT  - CT ABDOMEN AND PELVIS W0  - Mar 26 2012  9:06AM     RESULT: Indication: Thrombocytopenia    Comparison: None    Technique: Multiple axial images from the lung bases to the symphysis   pubis were obtained without oral and without intravenous contrast.    Findings:  There is a small right pleural effusion.  There is bibasilar airspace   disease likely representing atelectasis.    There is a nonobstructing right renal calculus. The left kidney is   unremarkable. No perinephric stranding is seen. The kidneys are symmetric   in size without evidence for exophytic mass. The bladder is unremarkable.    There is a micronodular contour of the liver concerning for cirrhosis.   There is a large amount of ascites. The gallbladder is unremarkable. The   spleen demonstrates no focal abnormality. The adrenal glands and pancreas   are normal.     The unopacified stomach,duodenum, small intestine, and large intestine   are unremarkable, but evaluation is limited by lack of oral contrast.    There is no pneumoperitoneum, pneumatosis, or portal venous gas. There is     no abdominal or pelvic free fluid. There is no lymphadenopathy.     The abdominal aorta is normal in caliber with atherosclerosis.    The osseous structures are unremarkable.    IMPRESSION:     1. Cirrhotic liver. Large amount of ascites.  2. Normal size spleen.    Dictation Site: 1        Verified By: Jennette Banker, M.D., MD   Assessment/Plan:  Assessment/Plan:  Assessment 1) diarrheal illness-positive campylobacter-improving 2) CT consistant with cirrhosis of the liver in the setting of positive ab for HepatitisC, pancytopenia.  Renal failure concerning for possibility of contributary effect of hepatitis C.   Plan 1) flex sig if no further improvement of diarrhea 2) will order cold agglutanins, HVC PCR pending.   Electronic Signatures: Loistine Simas (MD)  (Signed 01-Mar-14 12:51)  Authored: Chief Complaint, VITAL SIGNS/ANCILLARY NOTES, Brief Assessment, Lab Results, Radiology Results, Assessment/Plan   Last Updated: 01-Mar-14 12:51 by Loistine Simas (MD)

## 2014-05-21 NOTE — Consult Note (Signed)
PATIENT NAME:  [name], [name] MR#:  [id number] DATE OF BIRTH:  1955/05/23  DATE OF CONSULTATION:  03/29/2012  REFERRING PHYSICIAN:   CONSULTING PHYSICIAN:  Gonzella Lex, MD  IDENTIFYING INFORMATION AND CHIEF COMPLAINT:  This is a 59 year old gentleman, currently in the hospital for dialysis and treatment of infections, including diarrhea.  Consultation for mood instability and irritable behavior.   HISTORY OF PRESENT ILLNESS:  Information obtained from the patient and the chart.  It appears that the patient has been hostile and unpleasant towards the staff on several occasions.  His mood appears to be labile at times and it appears to be interfering with his treatment.  The patient tells me that he feels that he has been justified in getting angry at the staff.  He tells me that when he has diarrhea he will call for the nurses and when they come to see him he thinks they take too long before they clean him up.  He gets very frustrated and angry at this.  He feels disrespected about it.  He tells me that his mood has been still down and feels a little depressed.  He has been sleeping okay within his normal sleep cycle which is that he prefers to sleep during the daytime and stay up at night.  He denies any suicidal ideation whatsoever.  He denies any hallucinations.  He does not feel particularly paranoid.  The patient is not expressing any grandiosity, hyperactivity or other typical symptoms of mania.   PAST PSYCHIATRIC HISTORY:  The patient suffered from a brain tumor and had a resection in the past.  He says that ever since that happened he has been more irritable and prone to lose his temper and that he and people around him have noticed that.  He has been treated for depression with Lexapro.  He thinks that the Lexapro has helped to calm down his temper and made him less angry.  He denies suicidal ideation.  Denies ever being told he had bipolar disorder.  Denies any hospital treatment for  psychiatric illness.   PAST MEDICAL HISTORY:  The patient has end-stage renal disease, has hemodialysis.  History of hypertension, hepatitis C, heart failure, history of alcoholic abuse, history of meningioma status post resection, history of seizures in the past, currently being treated for bloody diarrhea and the complications it produces in a patient with multiple medical problems.   SOCIAL HISTORY:  The patient tells me that he lives alone.  He prefers it that way.  He does not seem to have a very extensive social routine.   CURRENT MEDICATIONS:  Norvasc 10 mg per day, azithromycin 250 mg a day, clonidine 0.2 mg twice a day, Lexapro 20 mg per day, folic acid 1 mg per day, hydrocortisone rectal cream 3 times a day, Levetiracetam 500 mg 2 times a day, Imodium 2 mg twice a day, losartan 100 mg per day, metoprolol 25 mg twice a day, multivitamin once a day, Dilantin 100 mg 3 times a day, potassium chloride currently being held.  Renvela 0.8 grams 3 times a day, Epogen injection and dialysis.   ALLERGIES:  No known drug allergies.   REVIEW OF SYSTEMS:  The patient complains of feeling irritable, frustrated and angry.  He believes he is justified in all of this.  Denies suicidal ideation or homicidal ideation.  Denies difficulty with sleeping.  Denies any psychotic symptoms.  He does have some insight, although he continues to insist that he has been justified  in the way he treats people and with cursing at times.   MENTAL STATUS EXAMINATION:   I interviewed the patient while he was undergoing hemodialysis today.  Given the circumstances, he was fairly cooperative.  Made good eye contact.  Psychomotor activity was normal given the circumstances.  Speech was normal in amount and tone.  Affect was somewhat irritable, but he did not lose his temper.  Mood was stated as being bad.  Thoughts appear to be lucid with no sign of loosening of associations or delusions or paranoia.  Denied suicidal or homicidal  ideation.  Denied any hallucinations.  The patient appeared to probably be of average intelligence.  Formal cognitive testing was not done, but he was alert and oriented x 4, understood why he was in the hospital, understood the treatment plan in general, was able to describe his usual routine medically and personally to me.   ASSESSMENT:  A 59 year old man with multiple medical problems, many of which are obviously causing him a great deal of discomfort.  It is understandable that he would be in a bad mood.  Nevertheless, the patient does continue to at times express himself with anger in ways that are contrary to his own good.  I explained that to the patient and while he was somewhat defensive about it, he did understand it.  I do not think the patient has bipolar disorder.  I do not think he is psychotic.  I think that probably the history of brain injury is leading to some of his lack of inhibition as well as a depression, both situational and perhaps constitutional.   TREATMENT PLAN:  I proposed to the patient that we add a small dose of Abilify.  Start with 2 mg a day.  This is both to treat the depression by perhaps enhancing the benefit of the Lexapro and also to help with calming down some of the irritability and anger that he might have.  The patient was accepting of this plan.  Psychoeducation and supportive therapy done.   DIAGNOSIS PRINCIPLE AND PRIMARY:  AXIS I:  Depression, not otherwise specified.   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  AXIS II:  Deferred.  AXIS III:  End-stage renal failure, Campylobacter infection, diarrhea, multiple medical problems as noted above.  AXIS IV:  Severe from his current illness as well as from what sounds like limited social support.  AXIS V:  Functioning at time of evaluation 46.      ____________________________ Gonzella Lex, MD jtc:ea D: 03/29/2012 23:02:32 ET T: 03/29/2012 23:38:55  ET JOB#: [id number]  cc: Gonzella Lex, MD, <Dictator> Gonzella Lex MD ELECTRONICALLY SIGNED 03/30/2012 14:03

## 2014-05-21 NOTE — Consult Note (Signed)
Brief Consult Note: Diagnosis: depression.   Patient was seen by consultant.   Consult note dictated.   Recommend further assessment or treatment.   Orders entered.   Comments: Psychiatry: After my previous note I was able to find the patient to the dialysis week and interview him to do the consult. I have dictated a full note. The bottom-line is I am adding 2 mg of Abilify to his medication and tried to do some psychoeducation. I will try to followup always in the hospital.  Electronic Signatures: Gonzella Lex (MD)  (Signed 01-Mar-14 23:03)  Authored: Brief Consult Note   Last Updated: 01-Mar-14 23:03 by Gonzella Lex (MD)

## 2014-05-21 NOTE — Consult Note (Signed)
PATIENT NAME:  [name], [name] MR#:  [id number] DATE OF BIRTH:  11-29-1955  DATE OF CONSULTATION:  03/26/2012  REFERRING PHYSICIAN:  Dr. Manuella Ghazi CONSULTING PHYSICIAN:  Heinz Knuckles. Kincade Granberg, MD  REASON FOR CONSULTATION: Diarrhea.   HISTORY OF PRESENT ILLNESS: The patient is a 59 year old man with a past history significant for end-stage renal disease, on hemodialysis, and brain tumor status post resection who was admitted on 02/24 with significant diarrhea. The patient states he has been having diarrhea for several weeks. He was hospitalized from January 25th through January 28th with significant diarrhea. At that time, he had stool studies that demonstrated Campylobacter. He was given Cipro and states that he took this at discharge and completed his course. Despite taking the antibiotic his diarrhea did not resolve. He did have some thickening of the stool, but it was not back to normal. Prior to his hospitalization in January, he had no problems with his bowels. He has had multiple bowel movements per day, ranging from the 10 to 20 range. They wake him up from sleep. He has not had significant nausea and vomiting, but he has had decreased p.o. intake, mainly because whenever he eats he moves his bowels. He has not had significant nausea or vomiting. He has not had significant abdominal pain or cramping. He has noticed some distention of his abdomen. During his hospitalization, he was found to be hepatitis C positive and CT scanning has indicated cirrhosis. Her reports that in the past he has had a liver biopsy and was told that he had some problems, but there was nothing that he needed to worry about and has received no therapy or further workup, per his report. Since being in the hospital this episode he has had repeat stool cultures which have shown C. diff negative, but Campylobacter antigen was again positive. He is currently on IV Cipro. GI has seen him and has initiated a workup for connective tissue  disease.   ALLERGIES:  No known drug allergies.  PAST MEDICAL HISTORY: 1.  Brain tumor status post resection.  2.  Seizures.  3.  Gout.  4.  Hypertension.  5.  End-stage renal disease, on hemodialysis.  6.  Newly diagnosed hepatitis C infection.  7.  Hepatic cirrhosis.   SOCIAL HISTORY: The patient lives by himself. He has no pets at home. He does not smoke. He does not drink. No injecting drug use history.   FAMILY HISTORY: Positive for renal failure in his mother.  REVIEW OF SYSTEMS:   GENERAL: No fevers, chills or sweats. Positive fatigue. Some malaise.  HEENT: No headaches. No sinus congestion. No sore throat.  NECK: No stiffness. No swollen glands.  RESPIRATORY: No cough. No shortness of breath. No sputum production.  CARDIAC: No chest pains or palpitations.  GASTROENTEROLOGY: No nausea. No vomiting. No abdominal pain. Decreased p.o. intake. Positive diarrhea up to 10 to 20 times per day with loose watery stool. He has noted some occasional blood in the stool, but he is not sure if he attributes this to hemorrhoids. He has not had any melena. He has not had any coffee-ground emesis or hematemesis.  GENITOURINARY: No complaints.  SKIN: No rashes.  NEUROLOGIC: No confusion. No focal weakness.  PSYCHIATRIC: No complaints. All other systems are negative.   PHYSICAL EXAMINATION: VITAL SIGNS: T-max of 99.7, T-current of 98.8, pulse 80, blood pressure 107/69 and 97% on room air.  GENERAL: A 59 year old black man somewhat fatigued but in no acute distress.  HEENT: Normocephalic, atraumatic.  Pupils equal and reactive to light. Extraocular motion intact. Sclerae, conjunctivae and lids are without evidence for emboli or petechiae. Oropharynx shows no erythema or exudate. Teeth and gums are in fair condition.  NECK: Supple. Full range of motion. Midline trachea. No lymphadenopathy. No thyromegaly.  CHEST:  Clear to auscultation bilaterally with good air movement. No focal consolidation.   CARDIAC: Regular rate and rhythm without murmur, rub or gallop.  ABDOMEN: Soft. Minimally distended but without any rebound or guarding. No hepatosplenomegaly. No hernia is noted.  EXTREMITIES: No evidence for tenosynovitis.  SKIN: No rashes. No stigmata of endocarditis, specifically no Janeway lesions or Osler nodes.  NEUROLOGIC: The patient was awake and interactive, moving all 4 extremities.  PSYCHIATRIC: Mood and affect appeared normal.   LABORATORY AND DIAGNOSTIC DATA: BUN of 99, creatinine 20.07 and potassium of 2.2.  LFTs showed an AST of 51, ALT 33, alk phos 141 and total bilirubin 0.8. White count is 5.0 with a hemoglobin of 7.7, platelet count of 43, ANC of 3.9 and ALC of 0.6. White count on admission was 9.9. C. diff PCR is negative. Stool culture is positive for Campylobacter antigen. A stool culture from his prior hospitalization also had a Campylobacter antigen positive.   His chest x-ray showed atelectasis versus infiltrate, in the right lung base. KUB and abdomen showed mild atelectasis versus infiltrate, in the right lower lobe. CT scan of the abdomen and pelvis without contrast showed cirrhotic liver with large amounts of ascites, normal size spleen. There were no other abnormalities noted, but the study was limited by the lack of contrast.  ASSESSMENT:  A 59 year old male with a history of brain tumor status post resection and end-stage renal disease, on hemodialysis, who was admitted with diarrhea.   RECOMMENDATIONS: 1.  He was hospitalized recently for similar symptoms. He was diagnosed with Campylobacter and given fluoroquinolone. He states that despite taking all the medications he continued to have diarrhea. A Campylobacter antigen is still present in his stool. Campylobacter can colonize the colon following treatment for quite a while. His lack of response to typical therapy suggests that there is another etiology to his diarrhea.  2.  He has been diagnosed with hepatitis C  infection and cirrhosis. Unclear if these would be a sufficient cause of his diarrhea. Gastroenterology is seeing him and further workup is underway.  3.  Would consider colonoscopy.  4.  Would continue Cipro for now, but I suspect that infection is not the etiology of his diarrhea.  5.  We will check a HIV antibody.   This is a moderately complex infectious disease case. Thank you very much for involving me in Mr. Balfour's care.  ____________________________ Heinz Knuckles. Shamell Suarez, MD meb:sb D: 03/26/2012 13:25:55 ET    T: 03/26/2012 14:05:01 ET        JOB#: [id number] cc: Heinz Knuckles. Luz Mares, MD, <Dictator> Bobbie Virden E Breyah Akhter MD ELECTRONICALLY SIGNED 03/31/2012 8:19

## 2014-05-21 NOTE — Discharge Summary (Signed)
PATIENT NAME:  [name], [name] MR#:  [id number] DATE OF BIRTH:  1955-02-08  DATE OF ADMISSION:  03/24/2012 DATE OF DISCHARGE:  03/31/2012  PRIMARY CARE PHYSICIAN: Meindert A. Brunetta Genera, MD  DISCHARGE DIAGNOSES:  1. Diarrhea with Campylobacter infection.  2. Anemia of chronic disease.  3. Hypokalemia.  4. Hypomagnesemia.  5. End-stage renal disease, on dialysis.  6. Hyponatremia.  7. Hypertension.  8. History of seizure.   CONDITION: Stable.   CODE STATUS: Full code.   DISCHARGE HOME MEDICATION:  1. Norvasc 10 mg p.o. daily. 2. Dialyvite oral tablet 1 tablet p.o. daily.  3. Escitalopram 20 mg p.o. tablets once a day.  4. Folic acid 1 mg p.o. daily.  5. Losartan 100 mg p.o. daily.  6. Lopressor 25 mg p.o. b.i.d.  7. Phenytoin 100 mg p.o. t.i.d.  8. Renvela 800 mg p.o. tablets 2 tablets t.i.d.  9. Clonidine 0.2 mg p.o. b.i.d.   10. Vitamin B1 100 mg p.o. tablets 1 tablet p.o. daily.  11. Aripiprazole 2 mg p.o. once daily.  12. Loperamide 2 mg capsule 1 cap b.i.d. for 7 days p.r.n. for diarrhea.  13. Zithromax 25 mg p.o. tablets 1 tablet once a day for 5 days.   DIET: Low sodium, renal diet.   ACTIVITY: As tolerated.   FOLLOWUP CARE:  1. Follow up with Dr. Juleen China of nephrology within 1 week and also needs followup BMP with him.  2. Follow up with Dr. Clayborn Bigness within 1 week.  3. Follow up with PCP within 1 to 2 weeks.   REASON FOR ADMISSION: Diarrhea.   HOSPITAL COURSE: The patient is a 59 year old African-American male with a history of hypertension, ESRD, CVA, seizure disorder, who presented to the ED with diarrhea after the patient was admitted for diarrhea due to Campylobacter infection last month, was treated with Cipro and Flagyl. The patient was discharged with Cipro after symptoms improved. However, the patient still had diarrhea after discharge. Stool studies are positive for Campylobacter. For detailed history and physical examination, please refer to the admission  note dictated by Dr. Anselm Jungling.   LABORATORY DATA: On admission, the following: Lipase 631, glucose 94, BUN 93, creatinine 19.8, sodium 138, potassium 2, chloride 105, bicarbonate 30, WBC 5.5, hemoglobin 7.5, platelets 47.   HOSPITAL COURSE: After admission, the patient was treated with Protonix with a rectal tube placed due to a lot of diarrhea. In addition, the patient was treated with morphine and Norco as he has a lot of pain. The patient was treated with Cipro initially, but according to Dr. Fabio Asa recommendation, the patient's antibiotic was changed to Zithromax. In addition, Dr. Clayborn Bigness suggested   Dictation ends here.  Please see addendum for continuation of dictation.   ____________________________ Demetrios Loll, MD qc:OSi D: 03/31/2012 17:12:00 ET T: 04/01/2012 09:34:11 ET JOB#: [id number]  cc: Demetrios Loll, MD, <Dictator> Demetrios Loll MD ELECTRONICALLY SIGNED 04/02/2012 14:51

## 2014-05-21 NOTE — H&P (Signed)
Subjective/Chief Complaint PRIMARY CARE PHYSICIAN: Dr. Brunetta Genera.  REFERRING PHYSICIAN: Dr. Corky Downs.   presenting complain: Diarrhea   History of Present Illness Was in hospital last month for diarrhea, after work up was found having Campylobacter in stool, C diff negative, and also had cholecystitis- was discharged with 14 days of oral cipro. finished course. Now claims his diarrhea nover resolved- still have loose stool once eveery 1/2 hour to 1 hour. Mild abdominal pain, no nausea/ vomiting/ fever. He noticed some blood in stool.  he says due to repeated bowel movements he could not even finish his HD for 3 hrs.   Primary Physician Dr. Brunetta Genera.   Past Med/Surgical Hx:  Seizure:   Brain Tumor removed:   esrd....TTS:   Gout:   Hypertension:   ALLERGIES:  No Known Allergies:   HOME MEDICATIONS: Medication Instructions Status  Keppra 500 mg oral tablet 1 tab(s) orally 2 times a day Active  clonidine 0.2 mg oral tablet 1 tab(s) orally 2 times a day Active  Vitamin B1 100 mg oral tablet 1 tab(s) orally once a day Active  amlodipine 10 mg oral tablet 1 tab(s) orally once a day Active  Dialyvite Rx oral tablet 1 tab(s) orally once a day Active  escitalopram 20 mg oral tablet 1 tab(s) orally once a day Active  folic acid 1 mg oral tablet 1 tab(s) orally once a day Active  losartan 100 mg oral tablet 1 tab(s) orally once a day Active  metoprolol tartrate 25 mg oral tablet 1 tab(s) orally 2 times a day Active  phenytoin 100 mg oral capsule, extended release 1 cap(s) orally 3 times a day Active  Renvela 800 mg oral tablet 2 tab(s) orally 3 times a day Active   Family and Social History:  Family History mother died of kidney failure.   Social History negative tobacco, negative ETOH, negative Illicit drugs    Review of Systems:  Fever/Chills No   Cough No   Sputum No   Abdominal Pain Yes  mild   Diarrhea Yes   Constipation No   Nausea/Vomiting No   SOB/DOE No    Chest Pain No   Dysuria No   Tolerating PT Yes   Tolerating Diet Yes   Physical Exam:  GEN well developed, thin, mild distress due to his diarrhea complain.   HEENT pale conjunctivae, hearing intact to voice, moist oral mucosa   NECK supple  No masses   RESP normal resp effort  clear BS  no use of accessory muscles   CARD regular rate  murmur present  No LE edema  systolic flow murmurs.   ABD denies tenderness  soft  normal BS  no Adominal Mass   EXTR negative cyanosis/clubbing, negative edema   SKIN No rashes, skin turgor good   NEURO cranial nerves intact, negative rigidity, negative tremor, follows commands, motor/sensory function intact   PSYCH alert, A+O to time, place, person, good insight   Lab Results:  Hepatic:  24-Feb-14 17:36   Bilirubin, Total 0.8  Alkaline Phosphatase  141  SGPT (ALT) 33  SGOT (AST)  51  Total Protein, Serum 7.7  Albumin, Serum  2.3  Routine Chem:  24-Feb-14 17:36   Lipase  631 (Result(s) reported on 24 Mar 2012 at 07:43PM.)  Glucose, Serum 94  BUN  93  Creatinine (comp)  19.80  Sodium, Serum 138  Potassium, Serum  2.0  Chloride, Serum 105  CO2, Serum  13  Calcium (Total), Serum  7.7  Osmolality (  calc) 304  eGFR (African American)  3  eGFR (Non-African American)  2 (eGFR values <34m/min/1.73 m2 may be an indication of chronic kidney disease (CKD). Calculated eGFR is useful in patients with stable renal function. The eGFR calculation will not be reliable in acutely ill patients when serum creatinine is changing rapidly. It is not useful in  patients on dialysis. The eGFR calculation may not be applicable to patients at the low and high extremes of body sizes, pregnant women, and vegetarians.)  Result Comment POTASSIUM - NOTIFIED OF CRITICAL VALUE POTASSIUM/TROPONIN - RESULTS VERIFIED BY REPEAT TESTING.  - [facility]PG C/ MARK WINSTEAD @ 1817 03/24/12  - READ-BACK PROCESS PERFORMED.  Result(s) reported on 24 Mar 2012 at 06:14PM.   ACleta Alberts 20  Cardiac:  24-Feb-14 17:36   Troponin I  0.06 (0.00-0.05 0.05 ng/mL or less: NEGATIVE  Repeat testing in 3-6 hrs  if clinically indicated. >0.05 ng/mL: POTENTIAL  MYOCARDIAL INJURY. Repeat  testing in 3-6 hrs if  clinically indicated. NOTE: An increase or decrease  of 30% or more on serial  testing suggests a  clinically important change)  CK, Total  34  CPK-MB, Serum 1.2 (Result(s) reported on 24 Mar 2012 at 06:14PM.)  Routine Hem:  24-Feb-14 17:36   WBC (CBC) 5.5  RBC (CBC)  2.21  Hemoglobin (CBC)  7.5  Hematocrit (CBC)  22.3  Platelet Count (CBC)  47 (Result(s) reported on 24 Mar 2012 at 05:55PM.)  MCV  101  MCH 33.9  MCHC 33.7  RDW  18.3    Assessment/Admission Diagnosis Diarrhea ESRD on HD Hypokalemia HTn Seizure disorder Anemia of chronic renal disease.   Plan Diarrhea?   C diff?  Or not well treated Campylobacter.   He doesn't appear septic.   WBCs noraml   BP high.   Will not start any Abx this time-    GI and ID consult   Check Stool for Culture, Fat presence, C diff  Hypokalemia   Replaced by ER   Recheck.  ESRD on HD   Continue as schedule- no fluid overload this time.  HTn   Continue home meds- undedr control.  Seizure disorder- continue dilantin, keppra.  Anemia of Chronic renal disease.- Stable.  DVT prophylaxis- PT is ambulating. Full code Total time spent- 60 min.   Electronic Signatures: VVaughan Basta(MD)  (Signed [id number])  Authored: CHIEF COMPLAINT and HISTORY, PAST MEDICAL/SURGIAL HISTORY, ALLERGIES, HOME MEDICATIONS, FAMILY AND SOCIAL HISTORY, REVIEW OF SYSTEMS, PHYSICAL EXAM, LABS, ASSESSMENT AND PLAN   Last Updated: 24-Feb-14 20:21 by VVaughan Basta(MD)

## 2014-05-21 NOTE — Consult Note (Signed)
PATIENT NAME:  [name], [name] MR#:  [id number] DATE OF BIRTH:  1955-06-23  DATE OF CONSULTATION:  03/25/2012  REFERRING PHYSICIAN:  Dr. Candiss Norse. CONSULTING PHYSICIAN:  Rhianon Zabawa R. Ma Hillock, MD  REASON FOR CONSULTATION:  Progressive thrombocytopenia.   HISTORY OF PRESENT ILLNESS:  The patient is a 59 year old African American gentleman who has been admitted to the hospital on February 24th with complaints of persistent progressive diarrhea, patient states that he has been having small liquid stools every 10 to 20 minutes and was even unable to sit and complete dialysis sessions as outpatient.  He is currently on IV fluids, continues to have significant diarrhea and is waiting for a rectal tube to be inserted.   He also has occasional abdominal pains.  Denies any blood in the stools or melena.  Denies any other obvious bleeding symptoms.  Past medical history otherwise significant for history of seizure, brain tumor removal, end-stage renal disease on hemodialysis, gout, hypertension.  The patient has developed progressive thrombocytopenia.  Review of CBCs available on our computer system shows that platelet count in September 2013 was 113, then it was low-normal range at 166 on October 30th.  In January 2014, platelet count was low at 88 with hemoglobin 8.6 and WBC count of 11,600.  It subsequently did fall to 50,000 on January 26th and then showed some improvement to 65,000 on January 27th.  During this hospitalization platelet count was 47,000 yesterday evening and today it is lower at 39,000 with hemoglobin down to 6.4, MCV of 100 and WBC count of 4700 with 78% neutrophils.   PAST MEDICAL HISTORY AND PAST SURGICAL HISTORY:  As in history of present illness.   FAMILY HISTORY:  Noncontributory, patient denies any known history of malignancy or hematological disorders.  Mother had kidney failure.   SOCIAL HISTORY:  Denies any smoking, alcohol or recreational drug usage.   HOME MEDICATIONS:  Keppra 500 mg  twice daily, clonidine 0.2 mg twice daily, amlodipine 10 mg daily, escitalopram 20 mg daily, Dialyvite RX tablet once daily, vitamin D 403 mg daily, folic acid 1 mg daily, losartan 100 mg daily, metoprolol tartrate 25 mg twice daily, phenytoin 100 mg 3 times a day, Renvela 800 mg 2 tablets 3 times daily.   ALLERGIES:  No known drug allergies.    REVIEW OF SYSTEMS: CONSTITUTIONAL:  Significant generalized weakness, chronic.  Denies fever or chills.  No night sweats.  HEENT:  Denies any headaches or dizziness at rest.  No epistaxis, ear or jaw pain.  CARDIAC:  Denies any angina, palpitation, orthopnea or PND. LUNGS:  Has chronic dyspnea on exertion.  Denies new cough, sputum, hemoptysis.  GASTROINTESTINAL:  As in history of present illness.  No nausea or vomiting.  Has diarrhea.  GENITOURINARY:  No urinary output, he is on dialysis.  EXTREMITIES:  No new pain or swelling.  NEUROLOGIC:  Denies any progressive headaches, new focal weakness or recent confusion.  ENDOCRINE:  No polyuria or polydipsia.  Appetite is fair.   PHYSICAL EXAMINATION: GENERAL:  The patient is resting in bed, moderately built and nourished individual, alert and oriented to self, place and person and answers simple questions appropriately.  No icterus.  Pallor present.  VITAL SIGNS:  98.4, 79, 20, 123/80, 98% on room air.  HEENT:  Normocephalic, atraumatic.  Extraocular movements intact.  Sclera anicteric.  Mouth is dry.  No thrush or petechiae.  NECK:  Negative for lymphadenopathy.  CARDIOVASCULAR:  S1, S2, regular rate and rhythm.  LUNGS:  Show  bilateral good air entry, decreased at bases.  ABDOMEN:  Soft, nondistended.  No hepatosplenomegaly clinically.  LYMPHATICS:  No adenopathy in axillary or inguinal areas.  EXTREMITIES:  Mild edema.  No cyanosis.  SKIN:  No generalized rashes, has minor bruising only.  No ecchymosis or petechiae over extremities.  NEUROLOGIC:  Limited exam.  Cranial nerves seem intact.  Moves all  extremities spontaneously.   LABORATORY RESULTS:  WBC 4700, ANC 3700, hemoglobin 6.4, MCV 100, platelets 39, creatinine 20, BUN 99, potassium 2.0, calcium 7.8.  Stool culture positive for Campylobacter antigen.  Stool Clostridium difficile is negative.   IMPRESSION AND RECOMMENDATIONS:  The patient is a 59 year old gentleman with known history of end-stage renal disease on hemodialysis, admitted to hospital because of persistent diarrhea with stool culture positive for Campylobacter antigen and he is on IV ciprofloxacin, has progressive thrombocytopenia and anemia. Anemia likely from underlying ESRD (anemia of chronic disease) but will need further evaluation. Etiology for thrombocytopenia is unclear and needs workup. Platelet count was better than this in January.  Per discussion with nephrology, the patient has had some work-up for thrombocytopenia at outside hospital and thrombotic thrombocytopenic purpura was felt to be one possibility and then he was placed on hemodialysis. Will try to get records of this since patient does not remember much about having a platelet issue.    He does not have hepatosplenomegaly on clinical exam, patient unsure if there is any known history of cirrhosis of the liver, states that he has had liver biopsy done in the past many years ago and was told that he did not have cirrhosis, but unclear as to what the report actually said.  Will get further work-up for anemia and thrombocytopenia, will check CBC with manual differential/peripheral smear, reticulocyte count, iron and TIBC, B12 and folate, heparin-induced platelet antibody to rule out heparin-induced thrombocytopenia, direct platelet antibody to look for evidence of idiopathic thrombocytopenia purpura, PT/PTT/fibrinogen/FDP to look for evidence of DIC/coagulopathy, LDH and haptoglobin, serum flow cytometry study to look for monoclonal B-cell population/lymphoproliferative disorder.  Recent SIEP sent on February 25th is  pending along with HCV antibody and kappa/lambda light chain ratio.  HBsAg is negative.  Also, work-up for connective tissue disorder is pending at this time.  The patient does not have any obvious bleeding symptoms at this time.  Continue to monitor for bleeding issues.  Will also get a CT scan of the abdomen/pelvis without contrast done tomorrow to evaluate for hepatosplenomegaly, cirrhosis or any underlying lymphadenopathy.  The patient explained that based upon results of work-up will decide if we would need to pursue bone marrow biopsy evaluation.  Will continue to follow.   Thank you for the referral.  Please feel free to contact me if any additional questions.    ____________________________ Rhett Bannister Ma Hillock, MD srp:ea D: 03/26/2012 01:05:09 ET T: 03/26/2012 02:02:14 ET JOB#: [id number]  cc: Trevor Iha R. Ma Hillock, MD, <Dictator> Alveta Heimlich MD ELECTRONICALLY SIGNED 03/26/2012 9:14

## 2014-05-21 NOTE — Discharge Summary (Signed)
DATE OF BIRTH:  12-26-1955  DISCHARGE DIAGNOSES:  1.  Campylobacter infection, now improving on Cipro and Flagyl.  2.  Hypokalemia, repleted and resolved.  3.  History of seizure on phenytoin and Keppra. Keppra is stopped. 4.  Abnormal abdominal ultrasound, no cholecystitis per surgery. Outpatient surgery followup if needed.  5.  Chronic diastolic heart failure.   SECONDARY DIAGNOSES:  1.  History of hypertension. 2.  End-stage renal disease on hemodialysis.  3.  Cerebrovascular accident and seizure disorder.   CONSULTATION:   1.  GI, Dr. Vira Agar.  2.  Nephrology, Dr. Candiss Norse.   PROCEDURES AND RADIOLOGY:  Chest x-ray on January 25th showed no acute cardiopulmonary disease. Abdominal ultrasound on January 25th showed thickened gallbladder wall without sonographic Murphy's sign. Hepatobiliary scan on January 27th showed findings consistent with biliary dyskinesia, possible chronic cholecystitis.   MAJOR LABORATORY PANEL:  Stool culture was positive for Campylobacter antigen. Negative for C. difficile on January 25th. Ova and parasites were negative.   HISTORY AND SHORT HOSPITAL COURSE:  The patient is a 59 year old male with the above-mentioned medical problems, who was admitted for dizziness, weakness and diarrhea. Stool studies were sent out. Please see Dr. Lianne Moris dictated history and physical for further details. The patient was evaluated by surgery, Dr. Burt Knack, considering positive abdominal ultrasound. He did not find any evidence of acute cholecystitis and did not recommend any further management. The patient was evaluated by GI, who recommended monitoring his stool, which in fact came back positive for Campylobacter, and antibiotics were switched to ciprofloxacin to finish a total of 14 days course. The patient was feeling much better by January 28th. He was getting hemodialysis while inpatient by Dr. Candiss Norse as scheduled. He was discharged in stable condition on January 28th.   PERTINENT  PHYSICAL EXAMINATION ON THE DATE OF DISCHARGE: VITAL SIGNS:  98.2, heart rate 76 per minute, respirations 17 per minute, blood pressure 138/83 mmHg, was saturating 96% on room air.  CARDIOVASCULAR:  S1, S2 normal. No murmurs, rales or gallop.  LUNGS:  Clear to auscultation bilaterally. No wheezing, rales, rhonchi or crepitation.  ABDOMEN:  Soft, benign.  NEUROLOGICAL:  Nonfocal examination.  GENERAL:  All other physical exam remained at baseline.    DISCHARGE MEDICATIONS:  1.  Amlodipine 10 mg p.o. daily.  2.  Dialyvite once daily.  3.  Escitalopram 20 mg p.o. daily.  4.  Folic acid 1 mg p.o. daily.  5.  Lasix 80 mg p.o. daily.  6.  Losartan 100 mg p.o. daily.  7.  Metoprolol 25 mg p.o. b.i.d.  8.  Phenytoin 100 mg 3 times a day.   9.  Vitamin B complex 100 mg p.o. daily. 10.  Renvela 800 mg 2 tablets p.o. 3 times a day.  11.  Oxycodone 5 mg p.o. every 6 hours as needed.  12.  Mapap 650 mg p.o. daily as needed.  13.  Ciprofloxacin 500 mg daily for 12 days.   DISCHARGE DIET:  Renal diet.   DISCHARGE ACTIVITY:  As tolerated.   DISCHARGE INSTRUCTIONS AND FOLLOWUP:  The patient is instructed to follow up with his primary care physician, Dr. Brunetta Genera, in 1 to 2 weeks. He will then follow up with Dr. Gaylyn Cheers in 2 to 4 weeks. He will get hemodialysis as scheduled as an outpatient.   TOTAL TIME DISCHARGING THIS PATIENT:  55 minutes.    ____________________________ Lucina Mellow. Manuella Ghazi, MD vss:ms D: 03/01/2012 16:07:42 ET T: 03/02/2012 19:06:20 ET JOB#: [id number]  cc: Raheel Kunkle  Trena Platt, MD, <Dictator> Meindert A. Brunetta Genera, MD Murlean Iba, MD Manya Silvas, MD Jerrol Banana. Burt Knack, Eldridge MD ELECTRONICALLY SIGNED 03/02/2012 23:22

## 2014-05-21 NOTE — Consult Note (Signed)
Brief Consult Note: Diagnosis: alcoholic liver disease.   Patient was seen by consultant.   Consult note dictated.   Comments: No evidence of acute cholecystitis. Pt has NO abd pain, no RUQ pain. pt is not a surgical candidate with his heavy alcohol use history and resultant thrombocytopenia. No need to get HIDA as he would not be a candidate for surgery even if he had symptoms. will sign off.  Electronic Signatures: Florene Glen (MD)  (Signed 26-Jan-14 09:25)  Authored: Brief Consult Note   Last Updated: 26-Jan-14 09:25 by Florene Glen (MD)

## 2014-05-21 NOTE — H&P (Signed)
PATIENT NAME:  [name], [name] MR#:  [id number] DATE OF BIRTH:  1956-01-15  DATE OF ADMISSION:  02/23/2012  PRIMARY CARE PHYSICIAN: Dr. Brunetta Genera.  REFERRING PHYSICIAN: Dr. Michel Santee.  CHIEF COMPLAINT: Dizziness, weakness and diarrhea for 1 week.   HISTORY OF PRESENT ILLNESS: The patient is 59 year old African American with a history of hypertension, gout, ESRD, CVA and seizure disorder. He presented to the ED with dizziness, weakness and diarrhea for the past 1 week. The patient is alert, awake, oriented, in no acute distress. The patient said that he was diagnosed with ESRD and has been on dialysis for the past 4 to 5 months, but he is lazy and missed dialysis for the past 1 week. In addition, the patient feels dizzy and weak for 1 week. He has had diarrhea multiple times a day for the past 1 week, which is watery, but he denies any melena or bloody stool. The patient denies any fever or chills. No abdominal pain, nausea, vomiting. The patient was noted to have bloody stool in the ED and stool occult test is positive. The patient's BUN is very high at 138 and creatinine is 14.5. Dr. Juleen China, nephrologist was contacted by ED physician and he suggested the patient needs dialysis today or tomorrow.   PAST MEDICAL HISTORY: As mentioned above, hypertension, ESRD on dialysis, CVA and seizure disorder.   PAST SURGICAL HISTORY: Left temporal craniotomy due to brain tumor.   FAMILY HISTORY: Mother died of kidney failure.   SOCIAL HISTORY: The patient smokes 1 to 2 cigarettes a day. Denies any alcohol drinking or illicit drugs.   ALLERGIES: None.   MEDICATIONS:  1.  Vitamin B1, 100 mg p.o. daily. 2.  Phenytoin 100 mg p.o. q.12 hours. 3.  Solu-Medrol 125 mg p.o. every 12 hours. 4.  Norco 5 mg/325 mg p.o. 1 to 2 tablets every 6 hours p.r.n.  5.  Lopressor 25 mg p.o. daily. 6.  Losartan 100 mg p.o. daily. 7.  Lisinopril 20 mg p.o. daily. 8.  Lexapro 20 mg p.o. daily.  9.  Keppra 500 mg p.o.  b.i.d. 10.  Folic acid 1 mg p.o. daily.  11.  Dialyvite Supreme D tablets once daily.  12.  Coreg 25 mg p.o. b.i.d.  13.  Norvasc 10 mg p.o. daily.   REVIEW OF SYSTEMS: CONSTITUTIONAL: The patient denies any fever or chills. No headache, but has dizziness and generalized weakness.  EYES: No double vision or blurred vision.  ENT: No postnasal drip, slurred speech or dysphagia.  CARDIOVASCULAR: No chest pain, palpitations, orthopnea or nocturnal dyspnea. No leg edema.  PULMONARY: No cough, sputum, shortness of breath or hemoptysis.  GASTROINTESTINAL: No abdominal pain, nausea, vomiting, but has diarrhea and bloody stool.  GENITOURINARY: No dysuria, hematuria or incontinence.  SKIN: No rash or jaundice.  NEUROLOGY: No syncope, loss of consciousness or seizure.  HEMATOLOGY: No easy bruising or bleeding.  ENDOCRINE: No polyuria, polydipsia, heat or cold intolerance.   PHYSICAL EXAMINATION:  VITAL SIGNS: Temperature 94.8, blood pressure 192/107, pulse 82, O2 saturation 100% on room air.  GENERAL: The patient is alert, awake, oriented, in no acute distress.  HEENT: Pupils are round, equal, reactive to light and accommodation. Dry oral mucosa. Clear oropharynx. NECK: Supple. No JVD or carotid bruits. No lymphadenopathy  CARDIOVASCULAR: S1, S2, regular rate and rhythm. No murmurs, gallops.  PULMONARY: Bilateral air entry. No wheezing or rales. No use of accessory muscles to breathe.  ABDOMEN: Soft. No distention or tenderness. No organomegaly. Bowel sounds present.  SKIN: No rash or jaundice.   EXTREMITIES: No edema, clubbing or cyanosis. No calf tenderness. Poor skin turgor.  NEUROLOGY: Alert and oriented x 3. No focal deficits. Power 5/5. Sensation intact.   LABORATORY AND RADIOLOGIC DATA: Troponin 0.04, CK 42, CK-MB 1.3. WBC 11.6, hemoglobin 8.6, platelets 88. Glucose 114, BUN 138, creatinine 14.15, sodium 139, potassium 2.7, chloride 105, bicarbonate 14, anion gap 20, bilirubin total 3.6,  direct 2.6, SGPT 52, SGOT 49, alkaline phosphatase 181. INR 1.1. Phosphate 6.1. Dilantin 11.1. Lipase 451. Chest x-ray: No acute cardiopulmonary disease.   IMPRESSION:  1.  Acute renal failure, dehydration.  2.  End-stage renal disease.  3.  Hypokalemia.  4.  Metabolic acidosis.  5.  Diarrhea.  6.  Accelerated hypertension.  7.  Gastrointestinal bleeding.  8.  Anemia.  9.  Thrombocytopenia.  10.  Abnormal liver function tests.  11.  Seizure disorder.   PLAN OF TREATMENT:  1.  The patient will be admitted to telemetry floor. We will hold lisinopril, losartan due to acute renal failure. We will give IV fluid support, follow up CMP and follow up with nephrologist for hemodialysis.  2.  For GI bleeding and anemia, we will give type and screen test. Followup GI consult and followup CBC.  3.  For abnormal liver function tests and elevated bilirubin, we will check abdominal ultrasound.  4.  For diarrhea, we will check stool culture, C. diff, and start Flagyl.  5.  For hypokalemia, the patient was given potassium in ED. We will follow up BMP and magnesium level.  6.  For accelerated hypertension, we will continue Coreg and Norvasc, but hold ACE inhibitor, add hydralazine and give IV hydralazine p.r.n.  7.  For seizure disorder, we will start seizure precautions. Continue Dilantin and Keppra.  8.  Discussed the patient's situation and the plan of treatment with the patient.   TIME SPENT: About 67 minutes.    ____________________________ Demetrios Loll, MD qc:jm D: 02/23/2012 18:44:23 ET T: 02/23/2012 20:44:13 ET JOB#: [id number]  cc: Demetrios Loll, MD, <Dictator> Demetrios Loll MD ELECTRONICALLY SIGNED 02/27/2012 16:04

## 2014-05-21 NOTE — H&P (Signed)
PATIENT NAME:  [name], [name] MR#:  [id number] DATE OF BIRTH:  [birth year]  DATE OF ADMISSION:  03/24/2012  NO DICTATION  ____________________________ Ceasar Lund. Anselm Jungling, MD vgv:cc D: 03/24/2012 20:03:25 ET T: 03/24/2012 23:03:36 ET JOB#: [id number]  cc: Ceasar Lund. Anselm Jungling, MD, <Dictator> Vaughan Basta MD ELECTRONICALLY SIGNED 03/30/2012 23:14

## 2014-05-21 NOTE — Consult Note (Signed)
Impression:    59yo male w/ h/o brain tumor, s/p resection, ESRD on HD admitted with diarrhea.    He was hospitalized recently for similar symptoms.  He was diagnosed with Campylobacter and given a fluoroquinolone.  He states that despite taking all the medication, he continued to have diarrhea.  Campylobacter Ag is still present in stool.  Campylobacter can colonize  the colon following treatment for quite a while.  His lack of response to typical therapy suggests that there is another etiology to his diarrhea.   He has been diagnosed with hepatitis C infection and cirrhosis.  Unclear if these would be a sufficient cause of his assocated diarrhea.  GI is seeing him.  Further work up is underway.     Would consider colonoscopy.   Would continue cipro for now, but I suspect that infection is not the etiology of his diarrhea. 5)    Will check HIV AB.  Electronic Signatures: Martie Fulgham MPH, Heinz Knuckles (MD) (Signed on 26-Feb-14 13:17)  Authored   Last Updated: 26-Feb-14 13:25 by Aquarius Latouche MPH, Heinz Knuckles (MD)

## 2014-05-21 NOTE — Consult Note (Signed)
Details:   - Psychiatry: Followup on patient with depression and a history of brain injury. He is being discharged today. He says he is looking forward to getting home and feels like he is less depressed. His affect is fatigued and somewhat dysphoric. He denies suicidal ideation. Appears to be lucid and attentive. He is tolerating his medication well. I think it is worth continuing the trial of his current medicine including the Abilify after discharge. No further treatment suggestions prior to discharge.   Electronic Signatures: Gonzella Lex (MD)  (Signed 03-Mar-14 14:31)  Authored: Details   Last Updated: 03-Mar-14 14:31 by Gonzella Lex (MD)

## 2014-05-21 NOTE — Consult Note (Signed)
Chief Complaint:  Subjective/Chief Complaint Diarrhea continues but feels a lot better.   VITAL SIGNS/ANCILLARY NOTES: **Vital Signs.:   28-Feb-14 08:20  Vital Signs Type Routine  Temperature Temperature (F) 98.4  Celsius 36.8  Pulse Pulse 76  Respirations Respirations 18  Systolic BP Systolic BP 009  Diastolic BP (mmHg) Diastolic BP (mmHg) 74  Mean BP 91  Pulse Ox % Pulse Ox % 90  Pulse Ox Activity Level  At rest  Oxygen Delivery Room Air/ 21 %   Lab Results: Hepatic:  28-Feb-14 09:31   SGOT (AST) 35 (Result(s) reported on 28 Mar 2012 at 10:26AM.)  Alkaline Phosphatase 92 (Result(s) reported on 28 Mar 2012 at 10:26AM.)  Albumin, Serum  1.5  Total Protein, Serum  5.3 (Result(s) reported on 28 Mar 2012 at 10:26AM.)  General Ref:  28-Feb-14 09:31   Prealbumin PREALBUMIN   Result(s) reported on 28 Mar 2012 at 10:26AM.  Routine Chem:  28-Feb-14 09:31   Phosphorus, Serum 2.6  Magnesium, Serum 1.9 (1.8-2.4 THERAPEUTIC RANGE: 4-7 mg/dL TOXIC: > 10 mg/dL  -----------------------)  Cholesterol, Serum  < 50 (Result(s) reported on 28 Mar 2012 at 10:26AM.)  Triglycerides, Serum 71 (Result(s) reported on 28 Mar 2012 at 10:26AM.)  Glucose, Serum 93  BUN  35  Creatinine (comp)  9.31  Sodium, Serum  133  Potassium, Serum  2.7  Chloride, Serum 98  CO2, Serum 26  Anion Gap 9  Osmolality (calc) 274  Calcium (Total), Serum  6.7  eGFR (African American)  7  eGFR (Non-African American)  6 (eGFR values <47m/min/1.73 m2 may be an indication of chronic kidney disease (CKD). Calculated eGFR is useful in patients with stable renal function. The eGFR calculation will not be reliable in acutely ill patients when serum creatinine is changing rapidly. It is not useful in  patients on dialysis. The eGFR calculation may not be applicable to patients at the low and high extremes of body sizes, pregnant women, and vegetarians.)  Result Comment CALCIUM - RESULTS VERIFIED BY REPEAT TESTING.   - NOTIFIED OF CRITICAL VALUE  - CALLED RESULT TO GLENDA FIELDS AT 1052  - 03/28/12-DAC  - READ-BACK PROCESS PERFORMED.  Result(s) reported on 28 Mar 2012 at 10:26AM.  Routine Coag:  28-Feb-14 09:31   Prothrombin  14.8  INR 1.1 (INR reference interval applies to patients on anticoagulant therapy. A single INR therapeutic range for coumarins is not optimal for all indications; however, the suggested range for most indications is 2.0 - 3.0. Exceptions to the INR Reference Range may include: Prosthetic heart valves, acute myocardial infarction, prevention of myocardial infarction, and combinations of aspirin and anticoagulant. The need for a higher or lower target INR must be assessed individually. Reference: The Pharmacology and Management of the Vitamin K  antagonists: the seventh ACCP Conference on Antithrombotic and Thrombolytic Therapy. CQZRAQ.7622Sept:126 (3suppl): [id number] A HCT value >55% may artifactually increase the PT.  In one study,  the increase was an average of 25%. Reference:  "Effect on Routine and Special Coagulation Testing Values of Citrate Anticoagulant Adjustment in Patients with High HCT Values." American Journal of Clinical Pathology 2006;126:400-405.)  Activated PTT (APTT)  47.2 (A HCT value >55% may artifactually increase the APTT. In one study, the increase was an average of 19%. Reference: "Effect on Routine and Special Coagulation Testing Values of Citrate Anticoagulant Adjustment in Patients with High HCT Values." American Journal of Clinical Pathology 2006;126:400-405.)  Routine Hem:  28-Feb-14 09:31   WBC (CBC)  3.7  Hemoglobin (CBC)  6.6 (Result(s) reported on 28 Mar 2012 at 10:26AM.)  Platelet Count (CBC)  25   Assessment/Plan:  Assessment/Plan:  Assessment Diarrhea with Campylobacter antigen positive despite treatment with Cipro at home. Antibiotic changed yesterday. Cirrhosis of liver.   Plan Continue current antibiotic as well as  Imodium. Dr. Donnella Sham will follow over the weekend. If diarrhea does not improve, may need a flexible sigmoidoscopy with biopsies early next week.   Electronic Signatures: Jill Side (MD)  (Signed 28-Feb-14 12:28)  Authored: Chief Complaint, VITAL SIGNS/ANCILLARY NOTES, Lab Results, Assessment/Plan   Last Updated: 28-Feb-14 12:28 by Jill Side (MD)

## 2014-05-21 NOTE — Consult Note (Signed)
Chief Complaint:  Subjective/Chief Complaint Patient is down for dialysis. Case discussed with Dr. Clayborn Bigness. I will start him on an antimotility agent as C. diff is negative. Dr. Clayborn Bigness will consider changing him to a macrolide. Will follow.   Electronic Signatures: Jill Side (MD)  (Signed 27-Feb-14 11:47)  Authored: Chief Complaint   Last Updated: 27-Feb-14 11:47 by Jill Side (MD)
